# Patient Record
Sex: Male | Born: 1950
Health system: Southern US, Community
[De-identification: ages and names within clinical notes are randomized; demographics above are authoritative.]

## PROBLEM LIST (undated history)

## (undated) DIAGNOSIS — E079 Disorder of thyroid, unspecified: Secondary | ICD-10-CM

## (undated) DIAGNOSIS — E039 Hypothyroidism, unspecified: Secondary | ICD-10-CM

## (undated) DIAGNOSIS — I10 Essential (primary) hypertension: Secondary | ICD-10-CM

## (undated) DIAGNOSIS — D649 Anemia, unspecified: Secondary | ICD-10-CM

## (undated) DIAGNOSIS — E119 Type 2 diabetes mellitus without complications: Secondary | ICD-10-CM

## (undated) DIAGNOSIS — I2510 Atherosclerotic heart disease of native coronary artery without angina pectoris: Secondary | ICD-10-CM

## (undated) HISTORY — PX: COLONOSCOPY: SHX174

## (undated) HISTORY — DX: Atherosclerotic heart disease of native coronary artery without angina pectoris: I25.10

## (undated) HISTORY — DX: Type 2 diabetes mellitus without complications: E11.9

## (undated) HISTORY — DX: Disorder of thyroid, unspecified: E07.9

---

## 2013-10-10 DIAGNOSIS — Z23 Encounter for immunization: Secondary | ICD-10-CM | POA: Diagnosis not present

## 2013-10-10 DIAGNOSIS — Z6828 Body mass index (BMI) 28.0-28.9, adult: Secondary | ICD-10-CM | POA: Diagnosis not present

## 2013-10-10 DIAGNOSIS — D649 Anemia, unspecified: Secondary | ICD-10-CM | POA: Diagnosis not present

## 2013-10-10 DIAGNOSIS — E119 Type 2 diabetes mellitus without complications: Secondary | ICD-10-CM | POA: Diagnosis not present

## 2013-10-10 DIAGNOSIS — F17200 Nicotine dependence, unspecified, uncomplicated: Secondary | ICD-10-CM | POA: Diagnosis not present

## 2013-10-10 DIAGNOSIS — Z0000 Encounter for general adult medical examination without abnormal findings: Secondary | ICD-10-CM | POA: Diagnosis not present

## 2013-10-11 DIAGNOSIS — Z0000 Encounter for general adult medical examination without abnormal findings: Secondary | ICD-10-CM | POA: Diagnosis not present

## 2013-10-11 DIAGNOSIS — E568 Deficiency of other vitamins: Secondary | ICD-10-CM | POA: Diagnosis not present

## 2013-10-11 DIAGNOSIS — D649 Anemia, unspecified: Secondary | ICD-10-CM | POA: Diagnosis not present

## 2013-10-11 DIAGNOSIS — E119 Type 2 diabetes mellitus without complications: Secondary | ICD-10-CM | POA: Diagnosis not present

## 2013-10-11 DIAGNOSIS — E785 Hyperlipidemia, unspecified: Secondary | ICD-10-CM | POA: Diagnosis not present

## 2013-10-14 DIAGNOSIS — E119 Type 2 diabetes mellitus without complications: Secondary | ICD-10-CM | POA: Diagnosis not present

## 2013-10-14 DIAGNOSIS — H26499 Other secondary cataract, unspecified eye: Secondary | ICD-10-CM | POA: Diagnosis not present

## 2013-10-14 DIAGNOSIS — I10 Essential (primary) hypertension: Secondary | ICD-10-CM | POA: Diagnosis not present

## 2013-10-14 DIAGNOSIS — H43819 Vitreous degeneration, unspecified eye: Secondary | ICD-10-CM | POA: Diagnosis not present

## 2013-10-14 DIAGNOSIS — Z961 Presence of intraocular lens: Secondary | ICD-10-CM | POA: Diagnosis not present

## 2013-11-04 DIAGNOSIS — M47817 Spondylosis without myelopathy or radiculopathy, lumbosacral region: Secondary | ICD-10-CM | POA: Diagnosis not present

## 2013-11-04 DIAGNOSIS — M5137 Other intervertebral disc degeneration, lumbosacral region: Secondary | ICD-10-CM | POA: Diagnosis not present

## 2013-11-04 DIAGNOSIS — M5030 Other cervical disc degeneration, unspecified cervical region: Secondary | ICD-10-CM | POA: Diagnosis not present

## 2013-11-04 DIAGNOSIS — M5126 Other intervertebral disc displacement, lumbar region: Secondary | ICD-10-CM | POA: Diagnosis not present

## 2013-11-05 DIAGNOSIS — Z0189 Encounter for other specified special examinations: Secondary | ICD-10-CM | POA: Diagnosis not present

## 2013-11-05 DIAGNOSIS — M47817 Spondylosis without myelopathy or radiculopathy, lumbosacral region: Secondary | ICD-10-CM | POA: Diagnosis not present

## 2013-11-05 DIAGNOSIS — M545 Low back pain, unspecified: Secondary | ICD-10-CM | POA: Diagnosis not present

## 2013-11-05 DIAGNOSIS — M5126 Other intervertebral disc displacement, lumbar region: Secondary | ICD-10-CM | POA: Diagnosis not present

## 2013-11-05 DIAGNOSIS — M5106 Intervertebral disc disorders with myelopathy, lumbar region: Secondary | ICD-10-CM | POA: Diagnosis not present

## 2013-11-18 DIAGNOSIS — R079 Chest pain, unspecified: Secondary | ICD-10-CM | POA: Diagnosis not present

## 2013-11-18 DIAGNOSIS — E785 Hyperlipidemia, unspecified: Secondary | ICD-10-CM | POA: Diagnosis not present

## 2013-11-18 DIAGNOSIS — I10 Essential (primary) hypertension: Secondary | ICD-10-CM | POA: Diagnosis not present

## 2013-11-18 DIAGNOSIS — I119 Hypertensive heart disease without heart failure: Secondary | ICD-10-CM | POA: Diagnosis not present

## 2013-11-20 DIAGNOSIS — M1710 Unilateral primary osteoarthritis, unspecified knee: Secondary | ICD-10-CM | POA: Diagnosis not present

## 2013-11-20 DIAGNOSIS — IMO0002 Reserved for concepts with insufficient information to code with codable children: Secondary | ICD-10-CM | POA: Diagnosis not present

## 2013-11-20 DIAGNOSIS — M2550 Pain in unspecified joint: Secondary | ICD-10-CM | POA: Diagnosis not present

## 2013-11-27 DIAGNOSIS — M47817 Spondylosis without myelopathy or radiculopathy, lumbosacral region: Secondary | ICD-10-CM | POA: Diagnosis not present

## 2013-11-27 DIAGNOSIS — M5126 Other intervertebral disc displacement, lumbar region: Secondary | ICD-10-CM | POA: Diagnosis not present

## 2013-12-03 DIAGNOSIS — I2510 Atherosclerotic heart disease of native coronary artery without angina pectoris: Secondary | ICD-10-CM | POA: Diagnosis not present

## 2013-12-03 DIAGNOSIS — D490 Neoplasm of unspecified behavior of digestive system: Secondary | ICD-10-CM | POA: Diagnosis not present

## 2013-12-03 DIAGNOSIS — E119 Type 2 diabetes mellitus without complications: Secondary | ICD-10-CM | POA: Diagnosis not present

## 2013-12-03 DIAGNOSIS — R0789 Other chest pain: Secondary | ICD-10-CM | POA: Diagnosis not present

## 2013-12-03 DIAGNOSIS — Z87891 Personal history of nicotine dependence: Secondary | ICD-10-CM | POA: Diagnosis not present

## 2013-12-03 DIAGNOSIS — Z7982 Long term (current) use of aspirin: Secondary | ICD-10-CM | POA: Diagnosis not present

## 2013-12-03 DIAGNOSIS — D649 Anemia, unspecified: Secondary | ICD-10-CM | POA: Diagnosis not present

## 2013-12-03 DIAGNOSIS — E785 Hyperlipidemia, unspecified: Secondary | ICD-10-CM | POA: Diagnosis not present

## 2013-12-03 DIAGNOSIS — I10 Essential (primary) hypertension: Secondary | ICD-10-CM | POA: Diagnosis not present

## 2013-12-25 DIAGNOSIS — M5126 Other intervertebral disc displacement, lumbar region: Secondary | ICD-10-CM | POA: Diagnosis not present

## 2013-12-25 DIAGNOSIS — M47817 Spondylosis without myelopathy or radiculopathy, lumbosacral region: Secondary | ICD-10-CM | POA: Diagnosis not present

## 2014-01-14 DIAGNOSIS — M47817 Spondylosis without myelopathy or radiculopathy, lumbosacral region: Secondary | ICD-10-CM | POA: Diagnosis not present

## 2014-01-14 DIAGNOSIS — M545 Low back pain, unspecified: Secondary | ICD-10-CM | POA: Diagnosis not present

## 2014-01-14 DIAGNOSIS — Z79899 Other long term (current) drug therapy: Secondary | ICD-10-CM | POA: Diagnosis not present

## 2014-01-14 DIAGNOSIS — M2550 Pain in unspecified joint: Secondary | ICD-10-CM | POA: Diagnosis not present

## 2014-01-22 DIAGNOSIS — E538 Deficiency of other specified B group vitamins: Secondary | ICD-10-CM | POA: Diagnosis not present

## 2014-01-22 DIAGNOSIS — J029 Acute pharyngitis, unspecified: Secondary | ICD-10-CM | POA: Diagnosis not present

## 2014-01-22 DIAGNOSIS — E119 Type 2 diabetes mellitus without complications: Secondary | ICD-10-CM | POA: Diagnosis not present

## 2014-01-22 DIAGNOSIS — R290 Tetany: Secondary | ICD-10-CM | POA: Diagnosis not present

## 2014-01-29 DIAGNOSIS — M545 Low back pain, unspecified: Secondary | ICD-10-CM | POA: Diagnosis not present

## 2014-01-29 DIAGNOSIS — M47817 Spondylosis without myelopathy or radiculopathy, lumbosacral region: Secondary | ICD-10-CM | POA: Diagnosis not present

## 2014-01-29 DIAGNOSIS — Z79899 Other long term (current) drug therapy: Secondary | ICD-10-CM | POA: Diagnosis not present

## 2014-01-29 DIAGNOSIS — M2550 Pain in unspecified joint: Secondary | ICD-10-CM | POA: Diagnosis not present

## 2014-02-12 DIAGNOSIS — M545 Low back pain, unspecified: Secondary | ICD-10-CM | POA: Diagnosis not present

## 2014-02-12 DIAGNOSIS — M47817 Spondylosis without myelopathy or radiculopathy, lumbosacral region: Secondary | ICD-10-CM | POA: Diagnosis not present

## 2014-02-26 DIAGNOSIS — M2550 Pain in unspecified joint: Secondary | ICD-10-CM | POA: Diagnosis not present

## 2014-02-26 DIAGNOSIS — M545 Low back pain, unspecified: Secondary | ICD-10-CM | POA: Diagnosis not present

## 2014-02-26 DIAGNOSIS — M1710 Unilateral primary osteoarthritis, unspecified knee: Secondary | ICD-10-CM | POA: Diagnosis not present

## 2014-02-26 DIAGNOSIS — IMO0002 Reserved for concepts with insufficient information to code with codable children: Secondary | ICD-10-CM | POA: Diagnosis not present

## 2014-02-26 DIAGNOSIS — M159 Polyosteoarthritis, unspecified: Secondary | ICD-10-CM | POA: Diagnosis not present

## 2014-03-26 DIAGNOSIS — M47817 Spondylosis without myelopathy or radiculopathy, lumbosacral region: Secondary | ICD-10-CM | POA: Diagnosis not present

## 2014-04-06 DIAGNOSIS — Z79899 Other long term (current) drug therapy: Secondary | ICD-10-CM | POA: Diagnosis not present

## 2014-04-06 DIAGNOSIS — M545 Low back pain: Secondary | ICD-10-CM | POA: Diagnosis not present

## 2014-04-06 DIAGNOSIS — M47816 Spondylosis without myelopathy or radiculopathy, lumbar region: Secondary | ICD-10-CM | POA: Diagnosis not present

## 2014-04-06 DIAGNOSIS — M47817 Spondylosis without myelopathy or radiculopathy, lumbosacral region: Secondary | ICD-10-CM | POA: Diagnosis not present

## 2014-04-06 DIAGNOSIS — M5441 Lumbago with sciatica, right side: Secondary | ICD-10-CM | POA: Diagnosis not present

## 2014-04-07 DIAGNOSIS — Z23 Encounter for immunization: Secondary | ICD-10-CM | POA: Diagnosis not present

## 2014-04-13 DIAGNOSIS — E785 Hyperlipidemia, unspecified: Secondary | ICD-10-CM | POA: Diagnosis not present

## 2014-04-13 DIAGNOSIS — D649 Anemia, unspecified: Secondary | ICD-10-CM | POA: Diagnosis not present

## 2014-04-13 DIAGNOSIS — E119 Type 2 diabetes mellitus without complications: Secondary | ICD-10-CM | POA: Diagnosis not present

## 2014-04-13 DIAGNOSIS — E538 Deficiency of other specified B group vitamins: Secondary | ICD-10-CM | POA: Diagnosis not present

## 2014-04-16 DIAGNOSIS — I10 Essential (primary) hypertension: Secondary | ICD-10-CM | POA: Diagnosis not present

## 2014-04-16 DIAGNOSIS — E559 Vitamin D deficiency, unspecified: Secondary | ICD-10-CM | POA: Diagnosis not present

## 2014-04-16 DIAGNOSIS — E785 Hyperlipidemia, unspecified: Secondary | ICD-10-CM | POA: Diagnosis not present

## 2014-04-16 DIAGNOSIS — R3915 Urgency of urination: Secondary | ICD-10-CM | POA: Diagnosis not present

## 2014-04-16 DIAGNOSIS — D510 Vitamin B12 deficiency anemia due to intrinsic factor deficiency: Secondary | ICD-10-CM | POA: Diagnosis not present

## 2014-04-16 DIAGNOSIS — E119 Type 2 diabetes mellitus without complications: Secondary | ICD-10-CM | POA: Diagnosis not present

## 2014-04-20 DIAGNOSIS — M5442 Lumbago with sciatica, left side: Secondary | ICD-10-CM | POA: Diagnosis not present

## 2014-04-20 DIAGNOSIS — M47816 Spondylosis without myelopathy or radiculopathy, lumbar region: Secondary | ICD-10-CM | POA: Diagnosis not present

## 2014-04-20 DIAGNOSIS — M545 Low back pain: Secondary | ICD-10-CM | POA: Diagnosis not present

## 2014-04-20 DIAGNOSIS — M47817 Spondylosis without myelopathy or radiculopathy, lumbosacral region: Secondary | ICD-10-CM | POA: Diagnosis not present

## 2014-04-20 DIAGNOSIS — M5432 Sciatica, left side: Secondary | ICD-10-CM | POA: Diagnosis not present

## 2014-04-20 DIAGNOSIS — Z79899 Other long term (current) drug therapy: Secondary | ICD-10-CM | POA: Diagnosis not present

## 2014-05-05 DIAGNOSIS — M47817 Spondylosis without myelopathy or radiculopathy, lumbosacral region: Secondary | ICD-10-CM | POA: Diagnosis not present

## 2014-06-30 DIAGNOSIS — E119 Type 2 diabetes mellitus without complications: Secondary | ICD-10-CM | POA: Diagnosis not present

## 2014-06-30 DIAGNOSIS — D519 Vitamin B12 deficiency anemia, unspecified: Secondary | ICD-10-CM | POA: Diagnosis not present

## 2014-08-25 DIAGNOSIS — D519 Vitamin B12 deficiency anemia, unspecified: Secondary | ICD-10-CM | POA: Diagnosis not present

## 2014-08-25 DIAGNOSIS — Z0001 Encounter for general adult medical examination with abnormal findings: Secondary | ICD-10-CM | POA: Diagnosis not present

## 2014-08-31 DIAGNOSIS — M25861 Other specified joint disorders, right knee: Secondary | ICD-10-CM | POA: Diagnosis not present

## 2014-08-31 DIAGNOSIS — D519 Vitamin B12 deficiency anemia, unspecified: Secondary | ICD-10-CM | POA: Diagnosis not present

## 2014-08-31 DIAGNOSIS — E119 Type 2 diabetes mellitus without complications: Secondary | ICD-10-CM | POA: Diagnosis not present

## 2014-08-31 DIAGNOSIS — G5761 Lesion of plantar nerve, right lower limb: Secondary | ICD-10-CM | POA: Insufficient documentation

## 2014-08-31 DIAGNOSIS — M1711 Unilateral primary osteoarthritis, right knee: Secondary | ICD-10-CM | POA: Insufficient documentation

## 2014-09-07 DIAGNOSIS — D519 Vitamin B12 deficiency anemia, unspecified: Secondary | ICD-10-CM | POA: Diagnosis not present

## 2014-09-15 DIAGNOSIS — D519 Vitamin B12 deficiency anemia, unspecified: Secondary | ICD-10-CM | POA: Diagnosis not present

## 2014-09-29 DIAGNOSIS — D519 Vitamin B12 deficiency anemia, unspecified: Secondary | ICD-10-CM | POA: Diagnosis not present

## 2014-10-27 DIAGNOSIS — D519 Vitamin B12 deficiency anemia, unspecified: Secondary | ICD-10-CM | POA: Diagnosis not present

## 2014-12-04 DIAGNOSIS — E1165 Type 2 diabetes mellitus with hyperglycemia: Secondary | ICD-10-CM | POA: Diagnosis not present

## 2014-12-04 DIAGNOSIS — D509 Iron deficiency anemia, unspecified: Secondary | ICD-10-CM | POA: Diagnosis not present

## 2014-12-04 DIAGNOSIS — D519 Vitamin B12 deficiency anemia, unspecified: Secondary | ICD-10-CM | POA: Diagnosis not present

## 2015-01-11 DIAGNOSIS — M1711 Unilateral primary osteoarthritis, right knee: Secondary | ICD-10-CM | POA: Diagnosis not present

## 2015-01-11 DIAGNOSIS — M47816 Spondylosis without myelopathy or radiculopathy, lumbar region: Secondary | ICD-10-CM | POA: Insufficient documentation

## 2015-01-11 DIAGNOSIS — M25861 Other specified joint disorders, right knee: Secondary | ICD-10-CM | POA: Diagnosis not present

## 2015-01-14 DIAGNOSIS — D519 Vitamin B12 deficiency anemia, unspecified: Secondary | ICD-10-CM | POA: Diagnosis not present

## 2015-02-05 DIAGNOSIS — D519 Vitamin B12 deficiency anemia, unspecified: Secondary | ICD-10-CM | POA: Diagnosis not present

## 2015-02-09 DIAGNOSIS — D649 Anemia, unspecified: Secondary | ICD-10-CM | POA: Diagnosis not present

## 2015-02-09 DIAGNOSIS — T8069XA Other serum reaction due to other serum, initial encounter: Secondary | ICD-10-CM | POA: Diagnosis not present

## 2015-02-09 DIAGNOSIS — E538 Deficiency of other specified B group vitamins: Secondary | ICD-10-CM | POA: Diagnosis not present

## 2015-02-18 DIAGNOSIS — D519 Vitamin B12 deficiency anemia, unspecified: Secondary | ICD-10-CM | POA: Diagnosis not present

## 2015-02-18 DIAGNOSIS — E1165 Type 2 diabetes mellitus with hyperglycemia: Secondary | ICD-10-CM | POA: Diagnosis not present

## 2015-02-18 DIAGNOSIS — Z85028 Personal history of other malignant neoplasm of stomach: Secondary | ICD-10-CM | POA: Diagnosis not present

## 2015-02-18 DIAGNOSIS — D509 Iron deficiency anemia, unspecified: Secondary | ICD-10-CM | POA: Diagnosis not present

## 2015-02-24 DIAGNOSIS — D519 Vitamin B12 deficiency anemia, unspecified: Secondary | ICD-10-CM | POA: Diagnosis not present

## 2015-07-26 DIAGNOSIS — Z85028 Personal history of other malignant neoplasm of stomach: Secondary | ICD-10-CM | POA: Diagnosis not present

## 2015-07-26 DIAGNOSIS — D519 Vitamin B12 deficiency anemia, unspecified: Secondary | ICD-10-CM | POA: Diagnosis not present

## 2015-07-26 DIAGNOSIS — E1165 Type 2 diabetes mellitus with hyperglycemia: Secondary | ICD-10-CM | POA: Diagnosis not present

## 2015-07-26 DIAGNOSIS — I10 Essential (primary) hypertension: Secondary | ICD-10-CM | POA: Diagnosis not present

## 2015-07-26 DIAGNOSIS — Z0001 Encounter for general adult medical examination with abnormal findings: Secondary | ICD-10-CM | POA: Diagnosis not present

## 2015-07-27 ENCOUNTER — Telehealth: Payer: Self-pay | Admitting: Gastroenterology

## 2015-07-27 NOTE — Telephone Encounter (Signed)
colonoscopy

## 2015-08-02 ENCOUNTER — Telehealth: Payer: Self-pay

## 2015-08-02 ENCOUNTER — Other Ambulatory Visit: Payer: Self-pay

## 2015-08-02 NOTE — Telephone Encounter (Signed)
Gastroenterology Pre-Procedure Review  Request Date: 08/30/15 Requesting Physician: Dr. Clayborn Bigness  PATIENT REVIEW QUESTIONS: The patient responded to the following health history questions as indicated:    1. Are you having any GI issues? no 2. Do you have a personal history of Polyps? yes (adenoma) 3. Do you have a family history of Colon Cancer or Polyps? no 4. Diabetes Mellitus? no 5. Joint replacements in the past 12 months?no 6. Major health problems in the past 3 months?no 7. Any artificial heart valves, MVP, or defibrillator?no    MEDICATIONS & ALLERGIES:    Patient reports the following regarding taking any anticoagulation/antiplatelet therapy:   Plavix, Coumadin, Eliquis, Xarelto, Lovenox, Pradaxa, Brilinta, or Effient? no Aspirin? no  Patient confirms/reports the following medications:  Current Outpatient Prescriptions  Medication Sig Dispense Refill  . glipiZIDE (GLUCOTROL) 5 MG tablet Take by mouth daily before breakfast.     No current facility-administered medications for this visit.    Patient confirms/reports the following allergies:  No Known Allergies  No orders of the defined types were placed in this encounter.    AUTHORIZATION INFORMATION Primary Insurance: 1D#: Group #:  Secondary Insurance: 1D#: Group #:  SCHEDULE INFORMATION: Date: 08/30/15 Time: Location: [place]

## 2015-08-02 NOTE — Telephone Encounter (Signed)
Pt scheduled for diagnostic colonoscopy at [facility] on 08/30/15. Instructs/rx mailed.

## 2015-08-02 NOTE — Telephone Encounter (Signed)
Pt was at school. Will call me back.

## 2015-09-15 ENCOUNTER — Encounter: Payer: Self-pay | Admitting: *Deleted

## 2015-09-16 NOTE — Discharge Instructions (Signed)

## 2015-09-20 ENCOUNTER — Encounter
Admission: RE | Disposition: A | Discharge status: Home / Self Care | Payer: Self-pay | Source: Ambulatory Visit | Attending: Gastroenterology

## 2015-09-20 ENCOUNTER — Ambulatory Visit: Payer: Medicare Other | Admitting: Anesthesiology

## 2015-09-20 ENCOUNTER — Ambulatory Visit
Admission: RE | Admit: 2015-09-20 | Discharge: 2015-09-20 | Disposition: A | Discharge status: Home / Self Care | Payer: Medicare Other | Source: Ambulatory Visit | Attending: Gastroenterology | Admitting: Gastroenterology

## 2015-09-20 DIAGNOSIS — Z1211 Encounter for screening for malignant neoplasm of colon: Secondary | ICD-10-CM | POA: Insufficient documentation

## 2015-09-20 DIAGNOSIS — K621 Rectal polyp: Secondary | ICD-10-CM | POA: Diagnosis not present

## 2015-09-20 DIAGNOSIS — D125 Benign neoplasm of sigmoid colon: Secondary | ICD-10-CM | POA: Diagnosis not present

## 2015-09-20 DIAGNOSIS — Z87891 Personal history of nicotine dependence: Secondary | ICD-10-CM | POA: Diagnosis not present

## 2015-09-20 DIAGNOSIS — Z0001 Encounter for general adult medical examination with abnormal findings: Secondary | ICD-10-CM | POA: Insufficient documentation

## 2015-09-20 DIAGNOSIS — Z86010 Personal history of colonic polyps: Secondary | ICD-10-CM | POA: Diagnosis not present

## 2015-09-20 DIAGNOSIS — K640 First degree hemorrhoids: Secondary | ICD-10-CM | POA: Diagnosis not present

## 2015-09-20 DIAGNOSIS — K635 Polyp of colon: Secondary | ICD-10-CM | POA: Diagnosis not present

## 2015-09-20 HISTORY — DX: Anemia, unspecified: D64.9

## 2015-09-20 HISTORY — PX: COLONOSCOPY WITH PROPOFOL: SHX5780

## 2015-09-20 HISTORY — PX: POLYPECTOMY: SHX149

## 2015-09-20 SURGERY — COLONOSCOPY WITH PROPOFOL
Anesthesia: Monitor Anesthesia Care | Wound class: Contaminated

## 2015-09-20 MED ORDER — SODIUM CHLORIDE 0.9 % IV SOLN: INTRAVENOUS | Status: DC

## 2015-09-20 MED ORDER — LIDOCAINE HCL (CARDIAC) 20 MG/ML IV SOLN
INTRAVENOUS | Status: DC | PRN
Start: 2015-09-20 — End: 2015-09-20
  Administered 2015-09-20: 40 mg via INTRAVENOUS

## 2015-09-20 MED ORDER — PROPOFOL 10 MG/ML IV BOLUS
INTRAVENOUS | Status: DC | PRN
  Administered 2015-09-20 (×2): 20 mg via INTRAVENOUS
  Administered 2015-09-20: 60 mg via INTRAVENOUS
  Administered 2015-09-20: 20 mg via INTRAVENOUS
  Administered 2015-09-20: 30 mg via INTRAVENOUS

## 2015-09-20 MED ORDER — LACTATED RINGERS IV SOLN
INTRAVENOUS | Status: DC
  Administered 2015-09-20: 09:00:00 via INTRAVENOUS

## 2015-09-20 SURGICAL SUPPLY — 21 items
CANISTER SUCT 1200ML W/VALVE (MISCELLANEOUS) ×4 IMPLANT
CLIP HMST 235XBRD CATH ROT (MISCELLANEOUS) IMPLANT
CLIP RESOLUTION 360 11X235 (MISCELLANEOUS)
FCP ESCP3.2XJMB 240X2.8X (MISCELLANEOUS)
FORCEPS BIOP RAD 4 LRG CAP 4 (CUTTING FORCEPS) ×4 IMPLANT
FORCEPS BIOP RJ4 240 W/NDL (MISCELLANEOUS)
FORCEPS ESCP3.2XJMB 240X2.8X (MISCELLANEOUS) IMPLANT
GOWN CVR UNV OPN BCK APRN NK (MISCELLANEOUS) ×4 IMPLANT
GOWN ISOL THUMB LOOP REG UNIV (MISCELLANEOUS) ×4
INJECTOR VARIJECT VIN23 (MISCELLANEOUS) IMPLANT
KIT DEFENDO VALVE AND CONN (KITS) IMPLANT
KIT ENDO PROCEDURE OLY (KITS) ×4 IMPLANT
MARKER SPOT ENDO TATTOO 5ML (MISCELLANEOUS) IMPLANT
PAD GROUND ADULT SPLIT (MISCELLANEOUS) IMPLANT
PROBE APC STR FIRE (PROBE) ×4 IMPLANT
SNARE SHORT THROW 13M SML OVAL (MISCELLANEOUS) IMPLANT
SNARE SHORT THROW 30M LRG OVAL (MISCELLANEOUS) IMPLANT
SPOT EX ENDOSCOPIC TATTOO (MISCELLANEOUS)
VARIJECT INJECTOR VIN23 (MISCELLANEOUS)
WATER STERILE IRR 250ML POUR (IV SOLUTION) ×4 IMPLANT
WIDE-EYE POLYPTRAP (MISCELLANEOUS) IMPLANT

## 2015-09-20 NOTE — Op Note (Signed)
[facility] Gastroenterology Patient Name: Travis Higgins Procedure Date: 09/20/2015 9:28 AM MRN: [id number] Account #: [id number] Date of Birth: 22-Jun-1950 Admit Type: Outpatient Age: 65 Room: [facility] OR ROOM 01 Gender: Male Note Status: Finalized Procedure:            Colonoscopy Indications:          Screening for colorectal malignant neoplasm Providers:            Lucilla Lame, MD Referring MD:         Lavera Guise, MD (Referring MD) Medicines:            Propofol per Anesthesia Complications:        No immediate complications. Procedure:            Pre-Anesthesia Assessment:                       - Prior to the procedure, a History and Physical was                        performed, and patient medications and allergies were                        reviewed. The patient's tolerance of previous                        anesthesia was also reviewed. The risks and benefits of                        the procedure and the sedation options and risks were                        discussed with the patient. All questions were                        answered, and informed consent was obtained. Prior                        Anticoagulants: The patient has taken no previous                        anticoagulant or antiplatelet agents. ASA Grade                        Assessment: II - A patient with mild systemic disease.                        After reviewing the risks and benefits, the patient was                        deemed in satisfactory condition to undergo the                        procedure.                       After obtaining informed consent, the colonoscope was                        passed under direct vision. Throughout the procedure,  the patient's blood pressure, pulse, and oxygen                        saturations were monitored continuously. The Olympus CF                        H180AL colonoscope (S#: [id number]) was introduced through                        the anus and advanced to the the cecum, identified by                        appendiceal orifice and ileocecal valve. The                        colonoscopy was performed without difficulty. The                        patient tolerated the procedure well. The quality of                        the bowel preparation was good. Findings:      The perianal and digital rectal examinations were normal.      A 3 mm polyp was found in the sigmoid colon. The polyp was sessile. The       polyp was removed with a cold biopsy forceps. Resection and retrieval       were complete.      Two sessile polyps were found in the rectum. The polyps were 2 to 3 mm       in size. These polyps were removed with a cold biopsy forceps. Resection       and retrieval were complete.      Non-bleeding internal hemorrhoids were found during retroflexion. The       hemorrhoids were Grade I (internal hemorrhoids that do not prolapse). Impression:           - One 3 mm polyp in the sigmoid colon, removed with a                        cold biopsy forceps. Resected and retrieved.                       - Two 2 to 3 mm polyps in the rectum, removed with a                        cold biopsy forceps. Resected and retrieved.                       - Non-bleeding internal hemorrhoids. Recommendation:       - Await pathology results.                       - Repeat colonoscopy in 5 years for surveillance. Procedure Code(s):    --- Professional ---                       [phone], Colonoscopy, flexible; with biopsy, single or                        multiple Diagnosis Code(s):    ---  Professional ---                       Z12.11, Encounter for screening for malignant neoplasm                        of colon                       D12.5, Benign neoplasm of sigmoid colon                       K62.1, Rectal polyp CPT copyright 2016 American Medical Association. All rights reserved. The codes documented in this report are  preliminary and upon coder review may  be revised to meet current compliance requirements. Lucilla Lame, MD 09/20/2015 9:44:02 AM This report has been signed electronically. Number of Addenda: 0 Note Initiated On: 09/20/2015 9:28 AM Scope Withdrawal Time: 0 hours 7 minutes 23 seconds  Total Procedure Duration: 0 hours 10 minutes 27 seconds       [facility] [place]

## 2015-09-20 NOTE — Anesthesia Preprocedure Evaluation (Signed)
Anesthesia Evaluation  Patient identified by MRN, date of birth, ID band Patient awake    Reviewed: Allergy & Precautions, H&P , NPO status , Patient's Chart, lab work & pertinent test results  Airway Mallampati: II  TM Distance: >3 FB Neck ROM: full    Dental no notable dental hx.    Pulmonary former smoker,    Pulmonary exam normal       Cardiovascular negative cardio ROS Normal cardiovascular exam    Neuro/Psych    GI/Hepatic negative GI ROS, Neg liver ROS,   Endo/Other  negative endocrine ROS  Renal/GU negative Renal ROS     Musculoskeletal   Abdominal   Peds  Hematology negative hematology ROS (+)   Anesthesia Other Findings   Reproductive/Obstetrics                            Anesthesia Physical Anesthesia Plan  ASA: I  Anesthesia Plan: MAC   Post-op Pain Management:    Induction:   Airway Management Planned:   Additional Equipment:   Intra-op Plan:   Post-operative Plan:   Informed Consent: I have reviewed the patients History and Physical, chart, labs and discussed the procedure including the risks, benefits and alternatives for the proposed anesthesia with the patient or authorized representative who has indicated his/her understanding and acceptance.     Plan Discussed with: CRNA  Anesthesia Plan Comments:         Anesthesia Quick Evaluation  

## 2015-09-20 NOTE — Transfer of Care (Signed)
Immediate Anesthesia Transfer of Care Note  Patient: Travis Higgins  Procedure(s) Performed: Procedure(s) with comments: COLONOSCOPY WITH PROPOFOL (N/A) POLYPECTOMY INTESTINAL - Sigmoid colon polyp x 1 Rectal polyp x 2  Patient Location: PACU  Anesthesia Type: MAC  Level of Consciousness: awake, alert  and patient cooperative  Airway and Oxygen Therapy: Patient Spontanous Breathing and Patient connected to supplemental oxygen  Post-op Assessment: Post-op Vital signs reviewed, Patient's Cardiovascular Status Stable, Respiratory Function Stable, Patent Airway and No signs of Nausea or vomiting  Post-op Vital Signs: Reviewed and stable  Complications: No apparent anesthesia complications

## 2015-09-20 NOTE — Anesthesia Procedure Notes (Signed)
Procedure Name: MAC Performed by: Roy Snuffer Pre-anesthesia Checklist: Patient identified, Emergency Drugs available, Suction available, Timeout performed and Patient being monitored Patient Re-evaluated:Patient Re-evaluated prior to inductionOxygen Delivery Method: Nasal cannula Placement Confirmation: positive ETCO2       

## 2015-09-20 NOTE — Anesthesia Postprocedure Evaluation (Signed)
Anesthesia Post Note  Patient: Travis Higgins  Procedure(s) Performed: Procedure(s) (LRB): COLONOSCOPY WITH PROPOFOL (N/A) POLYPECTOMY INTESTINAL  Patient location during evaluation: PACU Anesthesia Type: MAC Level of consciousness: awake and alert Pain management: pain level controlled Vital Signs Assessment: post-procedure vital signs reviewed and stable Respiratory status: spontaneous breathing and respiratory function stable Cardiovascular status: stable Anesthetic complications: no    Quin Mcpherson, III,  Avalina Benko D

## 2015-09-20 NOTE — H&P (Signed)
  [name]  [address] [name], [name] [address] Phone: [phone] Fax : [phone]  Primary Care Physician:  Lavera Guise, MD Primary Gastroenterologist:  Dr. Allen Norris  Pre-Procedure History & Physical: HPI:  Travis Higgins is a 65 y.o. male is here for an colonoscopy.   Past Medical History  Diagnosis Date  . Anemia     in past    Past Surgical History  Procedure Laterality Date  . Colonoscopy      Prior to Admission medications   Not on File    Allergies as of 08/02/2015  . (No Known Allergies)    History reviewed. No pertinent family history.  Social History   Social History  . Marital Status: Married    Spouse Name: N/A  . Number of Children: N/A  . Years of Education: N/A   Occupational History  . Not on file.   Social History Main Topics  . Smoking status: Former Research scientist (life sciences)  . Smokeless tobacco: Not on file     Comment: quit approx 2013  . Alcohol Use: No  . Drug Use: Not on file  . Sexual Activity: Not on file   Other Topics Concern  . Not on file   Social History Narrative    Review of Systems: See HPI, otherwise negative ROS  Physical Exam: BP 145/97 mmHg  Pulse 57  Temp(Src) 97.2 F (36.2 C) (Temporal)  Resp 17  Ht 5\' 5"  (1.651 m)  Wt 161 lb 11.2 oz (73.347 kg)  BMI 26.91 kg/m2  SpO2 100% General:   Alert,  pleasant and cooperative in NAD Head:  Normocephalic and atraumatic. Neck:  Supple; no masses or thyromegaly. Lungs:  Clear throughout to auscultation.    Heart:  Regular rate and rhythm. Abdomen:  Soft, nontender and nondistended. Normal bowel sounds, without guarding, and without rebound.   Neurologic:  Alert and  oriented x4;  grossly normal neurologically.  Impression/Plan: [name] is here for an colonoscopy to be performed for history of colon polyps  Risks, benefits, limitations, and alternatives regarding  colonoscopy have been reviewed with the patient.  Questions have been  answered.  All parties agreeable.   Ollen Bowl, MD  09/20/2015, 8:24 AM

## 2015-09-21 ENCOUNTER — Encounter: Payer: Self-pay | Admitting: Gastroenterology

## 2015-09-22 ENCOUNTER — Encounter: Payer: Self-pay | Admitting: Gastroenterology

## 2015-12-17 DIAGNOSIS — E1165 Type 2 diabetes mellitus with hyperglycemia: Secondary | ICD-10-CM | POA: Diagnosis not present

## 2015-12-17 DIAGNOSIS — E0781 Sick-euthyroid syndrome: Secondary | ICD-10-CM | POA: Diagnosis not present

## 2015-12-17 DIAGNOSIS — Z125 Encounter for screening for malignant neoplasm of prostate: Secondary | ICD-10-CM | POA: Diagnosis not present

## 2015-12-17 DIAGNOSIS — D519 Vitamin B12 deficiency anemia, unspecified: Secondary | ICD-10-CM | POA: Diagnosis not present

## 2015-12-17 DIAGNOSIS — E039 Hypothyroidism, unspecified: Secondary | ICD-10-CM | POA: Diagnosis not present

## 2015-12-17 DIAGNOSIS — Z0001 Encounter for general adult medical examination with abnormal findings: Secondary | ICD-10-CM | POA: Diagnosis not present

## 2015-12-17 DIAGNOSIS — E079 Disorder of thyroid, unspecified: Secondary | ICD-10-CM | POA: Diagnosis not present

## 2016-01-04 DIAGNOSIS — M659 Synovitis and tenosynovitis, unspecified: Secondary | ICD-10-CM | POA: Diagnosis not present

## 2016-01-04 DIAGNOSIS — M79672 Pain in left foot: Secondary | ICD-10-CM | POA: Diagnosis not present

## 2016-02-04 DIAGNOSIS — E1165 Type 2 diabetes mellitus with hyperglycemia: Secondary | ICD-10-CM | POA: Diagnosis not present

## 2016-02-04 DIAGNOSIS — E039 Hypothyroidism, unspecified: Secondary | ICD-10-CM | POA: Diagnosis not present

## 2016-02-15 DIAGNOSIS — E039 Hypothyroidism, unspecified: Secondary | ICD-10-CM | POA: Diagnosis not present

## 2016-03-01 DIAGNOSIS — E039 Hypothyroidism, unspecified: Secondary | ICD-10-CM | POA: Diagnosis not present

## 2016-03-13 DIAGNOSIS — D519 Vitamin B12 deficiency anemia, unspecified: Secondary | ICD-10-CM | POA: Diagnosis not present

## 2016-03-13 DIAGNOSIS — D509 Iron deficiency anemia, unspecified: Secondary | ICD-10-CM | POA: Diagnosis not present

## 2016-03-13 DIAGNOSIS — Z23 Encounter for immunization: Secondary | ICD-10-CM | POA: Diagnosis not present

## 2016-03-13 DIAGNOSIS — E039 Hypothyroidism, unspecified: Secondary | ICD-10-CM | POA: Diagnosis not present

## 2016-05-29 DIAGNOSIS — E039 Hypothyroidism, unspecified: Secondary | ICD-10-CM | POA: Diagnosis not present

## 2016-05-29 DIAGNOSIS — E1165 Type 2 diabetes mellitus with hyperglycemia: Secondary | ICD-10-CM | POA: Diagnosis not present

## 2016-06-01 ENCOUNTER — Other Ambulatory Visit
Admission: RE | Admit: 2016-06-01 | Discharge: 2016-06-01 | Disposition: A | Discharge status: Home / Self Care | Payer: Medicare Other | Source: Ambulatory Visit | Attending: Nurse Practitioner | Admitting: Nurse Practitioner

## 2016-06-01 DIAGNOSIS — I10 Essential (primary) hypertension: Secondary | ICD-10-CM | POA: Diagnosis not present

## 2016-06-01 DIAGNOSIS — Z125 Encounter for screening for malignant neoplasm of prostate: Secondary | ICD-10-CM | POA: Insufficient documentation

## 2016-06-01 DIAGNOSIS — E119 Type 2 diabetes mellitus without complications: Secondary | ICD-10-CM | POA: Diagnosis not present

## 2016-06-01 LAB — COMPREHENSIVE METABOLIC PANEL
ALBUMIN: 4.2 g/dL (ref 3.5–5.0)
ALT: 43 U/L (ref 17–63)
AST: 43 U/L — ABNORMAL HIGH (ref 15–41)
Alkaline Phosphatase: 99 U/L (ref 38–126)
Anion gap: 6 (ref 5–15)
BILIRUBIN TOTAL: 1.1 mg/dL (ref 0.3–1.2)
BUN: 9 mg/dL (ref 6–20)
CO2: 27 mmol/L (ref 22–32)
Calcium: 9.3 mg/dL (ref 8.9–10.3)
Chloride: 101 mmol/L (ref 101–111)
Creatinine, Ser: 0.82 mg/dL (ref 0.61–1.24)
GFR calc Af Amer: >60 mL/min (ref >60)
Glucose, Bld: 137 mg/dL — ABNORMAL HIGH (ref 65–99)
POTASSIUM: 4.4 mmol/L (ref 3.5–5.1)
Sodium: 134 mmol/L — ABNORMAL LOW (ref 135–145)
TOTAL PROTEIN: 7.8 g/dL (ref 6.5–8.1)

## 2016-06-01 LAB — LIPID PANEL
CHOL/HDL RATIO: 4.9 ratio
CHOLESTEROL: 172 mg/dL (ref 0–200)
HDL: 35 mg/dL — AB (ref >40)
LDL Cholesterol: 111 mg/dL — ABNORMAL HIGH (ref 0–99)
Triglycerides: 130 mg/dL (ref <150)
VLDL: 26 mg/dL (ref 0–40)

## 2016-06-01 LAB — CBC
HEMATOCRIT: 43.8 % (ref 40.0–52.0)
Hemoglobin: 14.5 g/dL (ref 13.0–18.0)
MCH: 26.8 pg (ref 26.0–34.0)
MCHC: 33.1 g/dL (ref 32.0–36.0)
MCV: 81 fL (ref 80.0–100.0)
Platelets: 207 10*3/uL (ref 150–440)
RBC: 5.41 MIL/uL (ref 4.40–5.90)
RDW: 14.7 % — AB (ref 11.5–14.5)
WBC: 5.9 10*3/uL (ref 3.8–10.6)

## 2016-06-01 LAB — T4, FREE: Free T4: 0.84 ng/dL (ref 0.61–1.12)

## 2016-06-01 LAB — PSA: PSA: 0.43 ng/mL (ref 0.00–4.00)

## 2016-06-01 LAB — TSH: TSH: 9.328 u[IU]/mL — ABNORMAL HIGH (ref 0.350–4.500)

## 2016-06-02 LAB — MICROALBUMIN, URINE: MICROALB UR: 12.8 ug/mL — AB

## 2016-06-13 DIAGNOSIS — E039 Hypothyroidism, unspecified: Secondary | ICD-10-CM | POA: Diagnosis not present

## 2016-06-13 DIAGNOSIS — E1165 Type 2 diabetes mellitus with hyperglycemia: Secondary | ICD-10-CM | POA: Diagnosis not present

## 2016-06-13 DIAGNOSIS — J069 Acute upper respiratory infection, unspecified: Secondary | ICD-10-CM | POA: Diagnosis not present

## 2016-09-11 DIAGNOSIS — E039 Hypothyroidism, unspecified: Secondary | ICD-10-CM | POA: Diagnosis not present

## 2016-09-11 DIAGNOSIS — D519 Vitamin B12 deficiency anemia, unspecified: Secondary | ICD-10-CM | POA: Diagnosis not present

## 2016-09-11 DIAGNOSIS — E1165 Type 2 diabetes mellitus with hyperglycemia: Secondary | ICD-10-CM | POA: Diagnosis not present

## 2016-09-11 DIAGNOSIS — R030 Elevated blood-pressure reading, without diagnosis of hypertension: Secondary | ICD-10-CM | POA: Diagnosis not present

## 2016-09-15 ENCOUNTER — Other Ambulatory Visit
Admission: RE | Admit: 2016-09-15 | Discharge: 2016-09-15 | Disposition: A | Discharge status: Home / Self Care | Payer: Medicare Other | Source: Ambulatory Visit | Attending: Nurse Practitioner | Admitting: Nurse Practitioner

## 2016-09-15 DIAGNOSIS — D509 Iron deficiency anemia, unspecified: Secondary | ICD-10-CM | POA: Insufficient documentation

## 2016-09-15 DIAGNOSIS — E538 Deficiency of other specified B group vitamins: Secondary | ICD-10-CM | POA: Insufficient documentation

## 2016-09-15 DIAGNOSIS — E039 Hypothyroidism, unspecified: Secondary | ICD-10-CM | POA: Diagnosis not present

## 2016-09-15 LAB — CBC
HEMATOCRIT: 41.4 % (ref 40.0–52.0)
HEMOGLOBIN: 13.5 g/dL (ref 13.0–18.0)
MCH: 26.5 pg (ref 26.0–34.0)
MCHC: 32.5 g/dL (ref 32.0–36.0)
MCV: 81.6 fL (ref 80.0–100.0)
Platelets: 192 10*3/uL (ref 150–440)
RBC: 5.07 MIL/uL (ref 4.40–5.90)
RDW: 14.9 % — ABNORMAL HIGH (ref 11.5–14.5)
WBC: 4.8 10*3/uL (ref 3.8–10.6)

## 2016-09-15 LAB — VITAMIN B12: Vitamin B-12: 176 pg/mL — ABNORMAL LOW (ref 180–914)

## 2016-09-15 LAB — TSH: TSH: 4.755 u[IU]/mL — ABNORMAL HIGH (ref 0.350–4.500)

## 2016-09-15 LAB — FOLATE: Folate: 27 ng/mL (ref >5.9)

## 2016-09-15 LAB — FERRITIN: Ferritin: 8 ng/mL — ABNORMAL LOW (ref 24–336)

## 2016-09-15 LAB — T4, FREE: FREE T4: 0.94 ng/dL (ref 0.61–1.12)

## 2016-10-25 DIAGNOSIS — D519 Vitamin B12 deficiency anemia, unspecified: Secondary | ICD-10-CM | POA: Diagnosis not present

## 2016-11-01 DIAGNOSIS — D519 Vitamin B12 deficiency anemia, unspecified: Secondary | ICD-10-CM | POA: Diagnosis not present

## 2016-11-09 DIAGNOSIS — D519 Vitamin B12 deficiency anemia, unspecified: Secondary | ICD-10-CM | POA: Diagnosis not present

## 2016-11-21 DIAGNOSIS — D519 Vitamin B12 deficiency anemia, unspecified: Secondary | ICD-10-CM | POA: Diagnosis not present

## 2016-12-22 DIAGNOSIS — D519 Vitamin B12 deficiency anemia, unspecified: Secondary | ICD-10-CM | POA: Diagnosis not present

## 2016-12-22 DIAGNOSIS — R52 Pain, unspecified: Secondary | ICD-10-CM | POA: Diagnosis not present

## 2016-12-22 DIAGNOSIS — E039 Hypothyroidism, unspecified: Secondary | ICD-10-CM | POA: Diagnosis not present

## 2017-01-08 ENCOUNTER — Other Ambulatory Visit: Payer: Self-pay

## 2017-01-19 DIAGNOSIS — Z0001 Encounter for general adult medical examination with abnormal findings: Secondary | ICD-10-CM | POA: Diagnosis not present

## 2017-01-19 DIAGNOSIS — B370 Candidal stomatitis: Secondary | ICD-10-CM | POA: Diagnosis not present

## 2017-01-19 DIAGNOSIS — D519 Vitamin B12 deficiency anemia, unspecified: Secondary | ICD-10-CM | POA: Diagnosis not present

## 2017-01-19 DIAGNOSIS — E039 Hypothyroidism, unspecified: Secondary | ICD-10-CM | POA: Diagnosis not present

## 2017-01-19 DIAGNOSIS — R079 Chest pain, unspecified: Secondary | ICD-10-CM | POA: Diagnosis not present

## 2017-01-19 DIAGNOSIS — I10 Essential (primary) hypertension: Secondary | ICD-10-CM | POA: Diagnosis not present

## 2017-01-19 DIAGNOSIS — E1140 Type 2 diabetes mellitus with diabetic neuropathy, unspecified: Secondary | ICD-10-CM | POA: Diagnosis not present

## 2017-01-22 DIAGNOSIS — Z23 Encounter for immunization: Secondary | ICD-10-CM | POA: Diagnosis not present

## 2017-02-13 DIAGNOSIS — B370 Candidal stomatitis: Secondary | ICD-10-CM | POA: Diagnosis not present

## 2017-02-13 DIAGNOSIS — I10 Essential (primary) hypertension: Secondary | ICD-10-CM | POA: Diagnosis not present

## 2017-02-13 DIAGNOSIS — R079 Chest pain, unspecified: Secondary | ICD-10-CM | POA: Diagnosis not present

## 2017-02-13 DIAGNOSIS — E1165 Type 2 diabetes mellitus with hyperglycemia: Secondary | ICD-10-CM | POA: Diagnosis not present

## 2017-02-23 DIAGNOSIS — D519 Vitamin B12 deficiency anemia, unspecified: Secondary | ICD-10-CM | POA: Diagnosis not present

## 2017-02-26 ENCOUNTER — Ambulatory Visit: Payer: Medicare Other | Admitting: Cardiovascular Disease

## 2017-03-05 DIAGNOSIS — D519 Vitamin B12 deficiency anemia, unspecified: Secondary | ICD-10-CM | POA: Diagnosis not present

## 2017-03-05 DIAGNOSIS — R079 Chest pain, unspecified: Secondary | ICD-10-CM | POA: Diagnosis not present

## 2017-04-05 ENCOUNTER — Ambulatory Visit: Payer: Medicare Other | Admitting: Cardiovascular Disease

## 2017-04-27 DIAGNOSIS — Z23 Encounter for immunization: Secondary | ICD-10-CM | POA: Diagnosis not present

## 2017-05-09 DIAGNOSIS — D519 Vitamin B12 deficiency anemia, unspecified: Secondary | ICD-10-CM | POA: Diagnosis not present

## 2017-05-24 ENCOUNTER — Ambulatory Visit (INDEPENDENT_AMBULATORY_CARE_PROVIDER_SITE_OTHER): Payer: Medicare Other | Admitting: Cardiovascular Disease

## 2017-05-24 ENCOUNTER — Encounter: Payer: Self-pay | Admitting: Cardiovascular Disease

## 2017-05-24 DIAGNOSIS — R0602 Shortness of breath: Secondary | ICD-10-CM

## 2017-05-24 MED ORDER — ASPIRIN EC 81 MG PO TBEC: 81.0000 mg | DELAYED_RELEASE_TABLET | Freq: Every day | ORAL | 3 refills | Status: DC

## 2017-05-24 NOTE — Progress Notes (Signed)
Cardiology Office Note   Date:  05/24/2017   ID:  Travis Higgins, [name] 03/20/1951, MRN [id number]  PCP:  Lavera Guise, MD  Cardiologist:   Travis Sacramento, MD   Chief Complaint  Patient presents with  . OTHER    Chest pain with exertion and leg weakness. Meds reviewed verbally with pt.      History of Present Illness: Travis Higgins is a 66 y.o. male who was referred by Travis Higgins for evaluation of shortness of breath and fatigue.  The patient has no previous cardiac history.  His wife reports that he possibly had cardiac catheterization in 2014 or 2015 in [place] which was unremarkable.  We do not have these records. He has prolonged history of type 2 diabetes and hypothyroidism.  He quit smoking about 5 years ago.  There is no family history of premature coronary artery disease.  He is a retired Radio producer. He reports intermittent episodes of exertional fatigue with excessive sweating with exertion.  He denies any chest pain.  The symptoms have been progressive.  No orthopnea, PND or leg edema.    Past Medical History:  Diagnosis Date  . Anemia    in past  . Diabetes mellitus without complication ([place])   . Thyroid disease     Past Surgical History:  Procedure Laterality Date  . COLONOSCOPY    . COLONOSCOPY WITH PROPOFOL N/A 09/20/2015   Procedure: COLONOSCOPY WITH PROPOFOL;  Surgeon: Travis Lame, MD;  Location: [facility];  Service: Endoscopy;  Laterality: N/A;  . POLYPECTOMY  09/20/2015   Procedure: POLYPECTOMY INTESTINAL;  Surgeon: Travis Lame, MD;  Location: [facility];  Service: Endoscopy;;  Sigmoid colon polyp x 1 Rectal polyp x 2     Current Outpatient Medications  Medication Sig Dispense Refill  . glipiZIDE (GLUCOTROL XL) 5 MG 24 hr tablet Take 5 mg by mouth 2 (two) times daily.     Marland Kitchen levothyroxine (SYNTHROID, LEVOTHROID) 50 MCG tablet Take 50 mcg by mouth daily before breakfast.     No current facility-administered medications for  this visit.     Allergies:   Patient has no known allergies.    Social History:  The patient  reports that he has quit smoking. he has never used smokeless tobacco. He reports that he does not drink alcohol or use drugs.   Family History:  The patient's family history is negative for coronary artery disease.   ROS:  Please see the history of present illness.   Otherwise, review of systems are positive for none.   All other systems are reviewed and negative.    PHYSICAL EXAM: VS:  BP (!) 156/83 (BP Location: Right Arm, Patient Position: Sitting, Cuff Size: Normal)   Pulse 73   Ht 5\' 5"  (1.651 m)   Wt 172 lb 12 oz (78.4 kg)   BMI 28.75 kg/m  , BMI Body mass index is 28.75 kg/m. GEN: Well nourished, well developed, in no acute distress  HEENT: normal  Neck: no JVD, carotid bruits, or masses Cardiac: RRR; no murmurs, rubs, or gallops,no edema  Respiratory:  clear to auscultation bilaterally, normal work of breathing GI: soft, nontender, nondistended, + BS MS: no deformity or atrophy  Skin: warm and dry, no rash Neuro:  Strength and sensation are intact Psych: euthymic mood, full affect   EKG:  EKG is ordered today. The ekg ordered today demonstrates normal sinus rhythm, left axis deviation with right bundle branch block nonspecific ST changes.  Recent Labs: 06/01/2016: ALT 43; BUN 9; Creatinine, Ser 0.82; Potassium 4.4; Sodium 134 09/15/2016: Hemoglobin 13.5; Platelets 192; TSH 4.755    Lipid Panel    Component Value Date/Time   CHOL 172 06/01/2016 0846   TRIG 130 06/01/2016 0846   HDL 35 (L) 06/01/2016 0846   CHOLHDL 4.9 06/01/2016 0846   VLDL 26 06/01/2016 0846   LDLCALC 111 (H) 06/01/2016 0846      Wt Readings from Last 3 Encounters:  05/24/17 172 lb 12 oz (78.4 kg)  09/20/15 161 lb 11.2 oz (73.3 kg)       PAD Screen 05/24/2017  Previous PAD dx? No  Previous surgical procedure? No  Pain with walking? No  Feet/toe relief with dangling? No  Painful,  non-healing ulcers? No  Extremities discolored? No      ASSESSMENT AND PLAN:  1.  Exertional fatigue and sweating: Rule out angina equivalent especially with prolonged history of type 2 diabetes.  His EKG is abnormal with right bundle branch block and nonspecific ST changes.  Due to all of the above, I recommend evaluation with a treadmill nuclear stress test.  I advised him to start taking aspirin 81 mg once daily. I discussed with the patient the importance of lifestyle changes in order to decrease the chance of future coronary artery disease and cardiovascular events. We discussed the importance of controlling risk factors, healthy diet as well as regular exercise.  2.  Elevated blood pressure without history of hypertension: Continue to monitor closely and consider adding an ACE inhibitor or ARB.  3.  Type 2 diabetes: Due to this, consider treatment with a statin given that diabetes mellitus is coronary artery disease equivalent.     Disposition:   FU with me as needed.   Signed,  Travis Sacramento, MD  05/24/2017 4:33 PM    [place]

## 2017-05-24 NOTE — Patient Instructions (Addendum)
Medication Instructions:  Your physician has recommended you make the following change in your medication:  1- START Aspirin 81 mg by mouth once a day.   Labwork: NONE  Testing/Procedures: [name]  Your caregiver has ordered a Stress Test with nuclear imaging. The purpose of this test is to evaluate the blood supply to your heart muscle. This procedure is referred to as a "Non-Invasive Stress Test." This is because other than having an IV started in your vein, nothing is inserted or "invades" your body. Cardiac stress tests are done to find areas of poor blood flow to the heart by determining the extent of coronary artery disease (CAD). Some patients exercise on a treadmill, which naturally increases the blood flow to your heart, while others who are  unable to walk on a treadmill due to physical limitations have a pharmacologic/chemical stress agent called Lexiscan . This medicine will mimic walking on a treadmill by temporarily increasing your coronary blood flow.   Please note: these test may take anywhere between 2-4 hours to complete  PLEASE REPORT TO [facility] AT THE FIRST DESK WILL DIRECT YOU WHERE TO GO  Date of Procedure:_____12/26/18____________  Arrival Time for Procedure:______07:45 am__________  Instructions regarding medication:   _XX_ : Hold diabetes medication (Glipizide) morning of procedure   PLEASE NOTIFY THE OFFICE AT LEAST 24 HOURS IN ADVANCE IF YOU ARE UNABLE TO KEEP YOUR APPOINTMENT.  [phone] AND  PLEASE NOTIFY NUCLEAR MEDICINE AT [facility] AT LEAST 24 HOURS IN ADVANCE IF YOU ARE UNABLE TO KEEP YOUR APPOINTMENT. [phone]  How to prepare for your Myoview test:  1. Do not eat or drink after midnight 2. No caffeine for 24 hours prior to test 3. No smoking 24 hours prior to test. 4. Your medication may be taken with water.  If your doctor stopped a medication because of this test, do not take that medication. 5. Ladies,  please do not wear dresses.  Skirts or pants are appropriate. Please wear a short sleeve shirt. 6. No perfume, cologne or lotion. 7. Wear comfortable walking shoes. No heels!    Follow-Up: Your physician recommends that you schedule a follow-up appointment in: AS NEEDED.    Cardiac Nuclear Scan A cardiac nuclear scan is a test that measures blood flow to the heart when a person is resting and when he or she is exercising. The test looks for problems such as:  Not enough blood reaching a portion of the heart.  The heart muscle not working normally.  You may need this test if:  You have heart disease.  You have had abnormal lab results.  You have had heart surgery or angioplasty.  You have chest pain.  You have shortness of breath.  In this test, a radioactive dye (tracer) is injected into your bloodstream. After the tracer has traveled to your heart, an imaging device is used to measure how much of the tracer is absorbed by or distributed to various areas of your heart. This procedure is usually done at a hospital and takes 2-4 hours. Tell a health care provider about:  Any allergies you have.  All medicines you are taking, including vitamins, herbs, eye drops, creams, and over-the-counter medicines.  Any problems you or family members have had with the use of anesthetic medicines.  Any blood disorders you have.  Any surgeries you have had.  Any medical conditions you have.  Whether you are pregnant or may be pregnant. What are the risks?  Generally, this is a safe procedure. However, problems may occur, including:  Serious chest pain and heart attack. This is only a risk if the stress portion of the test is done.  Rapid heartbeat.  Sensation of warmth in your chest. This usually passes quickly.  What happens before the procedure?  Ask your health care provider about changing or stopping your regular medicines. This is especially important if you are taking  diabetes medicines or blood thinners.  Remove your jewelry on the day of the procedure. What happens during the procedure?  An IV tube will be inserted into one of your veins.  Your health care provider will inject a small amount of radioactive tracer through the tube.  You will wait for 20-40 minutes while the tracer travels through your bloodstream.  Your heart activity will be monitored with an electrocardiogram (ECG).  You will lie down on an exam table.  Images of your heart will be taken for about 15-20 minutes.  You may be asked to exercise on a treadmill or stationary bike. While you exercise, your heart's activity will be monitored with an ECG, and your blood pressure will be checked. If you are unable to exercise, you may be given a medicine to increase blood flow to parts of your heart.  When blood flow to your heart has peaked, a tracer will again be injected through the IV tube.  After 20-40 minutes, you will get back on the exam table and have more images taken of your heart.  When the procedure is over, your IV tube will be removed. The procedure may vary among health care providers and hospitals. Depending on the type of tracer used, scans may need to be repeated 3-4 hours later. What happens after the procedure?  Unless your health care provider tells you otherwise, you may return to your normal schedule, including diet, activities, and medicines.  Unless your health care provider tells you otherwise, you may increase your fluid intake. This will help flush the contrast dye from your body. Drink enough fluid to keep your urine clear or pale yellow.  It is up to you to get your test results. Ask your health care provider, or the department that is doing the test, when your results will be ready. Summary  A cardiac nuclear scan measures the blood flow to the heart when a person is resting and when he or she is exercising.  You may need this test if you are at risk  for heart disease.  Tell your health care provider if you are pregnant.  Unless your health care provider tells you otherwise, increase your fluid intake. This will help flush the contrast dye from your body. Drink enough fluid to keep your urine clear or pale yellow. This information is not intended to replace advice given to you by your health care provider. Make sure you discuss any questions you have with your health care provider. Document Released: 06/23/2004 Document Revised: 05/31/2016 Document Reviewed: 05/07/2013 Elsevier Interactive Patient Education  2017 [employer].

## 2017-06-06 ENCOUNTER — Ambulatory Visit: Payer: Medicare Other | Admitting: Cardiovascular Disease

## 2017-06-06 ENCOUNTER — Encounter
Admission: RE | Admit: 2017-06-06 | Discharge: 2017-06-06 | Disposition: A | Discharge status: Home / Self Care | Payer: Medicare Other | Source: Ambulatory Visit | Attending: Cardiovascular Disease | Admitting: Cardiovascular Disease

## 2017-06-06 DIAGNOSIS — R0602 Shortness of breath: Secondary | ICD-10-CM | POA: Diagnosis not present

## 2017-06-06 MED ORDER — TECHNETIUM TC 99M TETROFOSMIN IV KIT
32.6940 | PACK | Freq: Once | INTRAVENOUS | Status: AC | PRN
Start: 2017-06-06 — End: 2017-06-06
  Administered 2017-06-06: 32.694 via INTRAVENOUS

## 2017-06-06 MED ORDER — TECHNETIUM TC 99M TETROFOSMIN IV KIT
13.0000 | PACK | Freq: Once | INTRAVENOUS | Status: AC | PRN
  Administered 2017-06-06: 12.424 via INTRAVENOUS

## 2017-06-07 LAB — NM MYOCAR MULTI W/SPECT W/WALL MOTION / EF
CHL CUP NUCLEAR SDS: 18
CHL CUP NUCLEAR SSS: 19
CSEPEW: 8.5 METS
Exercise duration (min): 7 min
Exercise duration (sec): 31 s
LV dias vol: 103 mL (ref 62–150)
LV sys vol: 40 mL
NUC STRESS TID: 1
Peak HR: 148 {beats}/min
Percent HR: 97 %
Rest HR: 45 {beats}/min
SRS: 6

## 2017-06-08 ENCOUNTER — Telehealth: Payer: Self-pay

## 2017-06-08 ENCOUNTER — Other Ambulatory Visit: Payer: Self-pay

## 2017-06-08 DIAGNOSIS — E119 Type 2 diabetes mellitus without complications: Secondary | ICD-10-CM | POA: Diagnosis not present

## 2017-06-08 DIAGNOSIS — R0602 Shortness of breath: Secondary | ICD-10-CM

## 2017-06-08 DIAGNOSIS — Z01812 Encounter for preprocedural laboratory examination: Secondary | ICD-10-CM

## 2017-06-08 MED ORDER — ROSUVASTATIN CALCIUM 20 MG PO TABS: 20.0000 mg | ORAL_TABLET | Freq: Every day | ORAL | 3 refills | Status: DC

## 2017-06-08 NOTE — Patient Instructions (Addendum)
Angiogram An angiogram is an X-ray test. It is used to look at your blood vessels. For this test, a dye is put into the blood vessel being checked. The dye shows up on X-rays. It helps your doctor see if there is a blockage or other problem in the blood vessel. What happens before the procedure?  Follow your doctor's instructions about limiting what you eat or drink.  Ask your doctor if you may drink enough water to take any needed medicines the morning of the test.  Plan to have someone take you home after the test.  If you go home the same day as the test, plan to have someone stay with you for 24 hours. What happens during the procedure?  An IV tube will be put into one of your veins.  You will be given a medicine that makes you relax (sedative).  Your skin will be washed where the thin tube (catheter) will be put in. Hair may be removed from this area. The tube may be put into: ? Your upper leg area (groin). ? The fold of your arm, near your elbow. ? Your wrist.  You will be given a medicine that numbs the area where the tube will be inserted (local anesthetic).  The tube will be inserted into a blood vessel.  Using a type of X-ray (fluoroscopy) to see, your doctor will move the tube into the blood vessel to check it.  Dye will be put in through the tube. X-rays of your blood vessels will then be taken. Different doctors and hospitals may do this procedure differently. What happens after the procedure?  If the test is done through the leg, you will be kept in bed lying flat for several hours. You will be told to not bend or cross your legs.  The area where the tube was inserted will be checked often.  The pulse in your feet or wrist will be checked often.  More tests or X-rays may be done. This information is not intended to replace advice given to you by your health care provider. Make sure you discuss any questions you have with your health care provider. Document  Released: 08/25/2008 Document Revised: 11/04/2015 Document Reviewed: 10/30/2012 Elsevier Interactive Patient Education  2017 [place] After This sheet gives you information about how to care for yourself after your procedure. Your doctor may also give you more specific instructions. If you have problems or questions, contact your doctor. Follow these instructions at home: Insertion site care  Follow instructions from your doctor about how to take care of your long, thin tube (catheter) insertion area. Make sure you: ? Wash your hands with soap and water before you change your bandage (dressing). If you cannot use soap and water, use hand sanitizer. ? Change your bandage as told by your doctor. ? Leave stitches (sutures), skin glue, or skin tape (adhesive) strips in place. They may need to stay in place for 2 weeks or longer. If tape strips get loose and curl up, you may trim the loose edges. Do not remove tape strips completely unless your doctor says it is okay.  Do not take baths, swim, or use a hot tub until your doctor says it is okay.  You may shower 24-48 hours after the procedure or as told by your doctor. ? Gently wash the area with plain soap and water. ? Pat the area dry with a clean towel. ? Do not rub the area. This may  cause bleeding.  Do not apply powder or lotion to the area. Keep the area clean and dry.  Check your insertion area every day for signs of infection. Check for: ? More redness, swelling, or pain. ? Fluid or blood. ? Warmth. ? Pus or a bad smell. Activity  Rest as told by your doctor, usually for 1-2 days.  Do not lift anything that is heavier than 10 lbs. (4.5 kg) or as told by your doctor.  Do not drive for 24 hours if you were given a medicine to help you relax (sedative).  Do not drive or use heavy machinery while taking prescription pain medicine. General instructions  Go back to your normal activities as told by your  doctor, usually in about a week. Ask your doctor what activities are safe for you.  If the insertion area starts to bleed, lie flat and put pressure on the area. If the bleeding does not stop, get help right away. This is an emergency.  Drink enough fluid to keep your pee (urine) clear or pale yellow.  Take over-the-counter and prescription medicines only as told by your doctor.  Keep all follow-up visits as told by your doctor. This is important. Contact a doctor if:  You have a fever.  You have chills.  You have more redness, swelling, or pain around your insertion area.  You have fluid or blood coming from your insertion area.  The insertion area feels warm to the touch.  You have pus or a bad smell coming from your insertion area.  You have more bruising around the insertion area.  Blood collects in the tissue around the insertion area (hematoma) that may be painful to the touch. Get help right away if:  You have a lot of pain in the insertion area.  The insertion area swells very fast.  The insertion area is bleeding, and the bleeding does not stop after holding steady pressure on the area.  The area near or just beyond the insertion area becomes pale, cool, tingly, or numb. These symptoms may be an emergency. Do not wait to see if the symptoms will go away. Get medical help right away. Call your local emergency services (911 in the [place] Do not drive yourself to the hospital. Summary  After the procedure, it is common to have bruising and tenderness at the long, thin tube insertion area.  After the procedure, it is important to rest and drink plenty of fluids.  Do not take baths, swim, or use a hot tub until your doctor says it is okay to do so. You may shower 24-48 hours after the procedure or as told by your doctor.  If the insertion area starts to bleed, lie flat and put pressure on the area. If the bleeding does not stop, get help right away. This is an  emergency. This information is not intended to replace advice given to you by your health care provider. Make sure you discuss any questions you have with your health care provider. Document Released: 08/25/2008 Document Revised: 05/23/2016 Document Reviewed: 05/23/2016 Elsevier Interactive Patient Education  2017 [employer].

## 2017-06-08 NOTE — Telephone Encounter (Signed)
Pt agreeable to 1/7 left heart cath and possible PCI.  See results note.

## 2017-06-11 ENCOUNTER — Other Ambulatory Visit: Payer: Self-pay

## 2017-06-11 ENCOUNTER — Telehealth: Payer: Self-pay | Admitting: Cardiovascular Disease

## 2017-06-11 ENCOUNTER — Ambulatory Visit (INDEPENDENT_AMBULATORY_CARE_PROVIDER_SITE_OTHER): Payer: Medicare Other

## 2017-06-11 DIAGNOSIS — R0602 Shortness of breath: Secondary | ICD-10-CM

## 2017-06-11 LAB — ECHOCARDIOGRAM COMPLETE
AOASC: 37 cm
AV Area VTI index: 1.96 cm2/m2
AV Area mean vel: 3.56 cm2
AV VEL mean LVOT/AV: 0.86
AV area mean vel ind: 1.91 cm2/m2
AVA: 3.65 cm2
AVG: 3 mmHg
CHL CUP AV VEL: 3.65
DOP CAL AO MEAN VELOCITY: 84.7 cm/s
EERAT: 9.81
EWDT: 250 ms
FS: 35 % (ref 28–44)
IVS/LV PW RATIO, ED: 1.36
LA diam index: 1.94 cm/m2
LA vol A4C: 28.5 ml
LA vol index: 19.4 mL/m2
LA vol: 36 mL
LASIZE: 36 mm
LEFT ATRIUM END SYS DIAM: 36 mm
LV PW d: 14 mm — AB (ref 0.6–1.1)
LV TDI E'LATERAL: 5.34
LV e' LATERAL: 5.34 cm/s
LVEEAVG: 9.81
LVEEMED: 9.81
LVOT SV: 91 mL
LVOT VTI: 21.9 cm
LVOT area: 4.15 cm2
LVOT diameter: 23 mm
LVOT peak VTI: 0.88 cm
MV Dec: 250
MVAP: 3.06 cm2
MVPKAVEL: 87.3 m/s
MVPKEVEL: 52.4 m/s
MVSPHT: 73 ms
RV LATERAL S' VELOCITY: 15.3 cm/s
RV TAPSE: 19.4 mm
TDI e' medial: 5.26
VTI: 24.9 cm
Valve area index: 1.96

## 2017-06-11 NOTE — Telephone Encounter (Signed)
Pt in office today for echo. Met with him to review Jan 7 left heart cath instructions and need for labs and CXR. 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 I explained the importance of this procedure as stress test was abnormal and suggestive of 2 blockages. He again states he will notify our office when he returns from [place] which may be as soon as February but as late as April. I have left a message for Scheduling at [facility]. Will route to Dr. Fletcher Anon to make aware.

## 2017-06-13 DIAGNOSIS — I10 Essential (primary) hypertension: Secondary | ICD-10-CM | POA: Insufficient documentation

## 2017-06-13 DIAGNOSIS — E538 Deficiency of other specified B group vitamins: Secondary | ICD-10-CM | POA: Insufficient documentation

## 2017-06-13 DIAGNOSIS — E1140 Type 2 diabetes mellitus with diabetic neuropathy, unspecified: Secondary | ICD-10-CM | POA: Insufficient documentation

## 2017-06-14 ENCOUNTER — Ambulatory Visit: Payer: Medicare Other | Admitting: Nurse Practitioner

## 2017-06-14 ENCOUNTER — Encounter: Payer: Self-pay | Admitting: Nurse Practitioner

## 2017-06-14 VITALS — BP 140/84 | HR 52 | Resp 16 | Ht 65.0 in | Wt 175.4 lb

## 2017-06-14 DIAGNOSIS — E1165 Type 2 diabetes mellitus with hyperglycemia: Secondary | ICD-10-CM

## 2017-06-14 DIAGNOSIS — E039 Hypothyroidism, unspecified: Secondary | ICD-10-CM | POA: Diagnosis not present

## 2017-06-14 DIAGNOSIS — I10 Essential (primary) hypertension: Secondary | ICD-10-CM | POA: Diagnosis not present

## 2017-06-14 DIAGNOSIS — E538 Deficiency of other specified B group vitamins: Secondary | ICD-10-CM

## 2017-06-14 LAB — POCT GLYCOSYLATED HEMOGLOBIN (HGB A1C): HEMOGLOBIN A1C: 7.2

## 2017-06-14 MED ORDER — GLUCOSE BLOOD VI STRP: ORAL_STRIP | 11 refills | Status: DC

## 2017-06-14 MED ORDER — CYANOCOBALAMIN 1000 MCG/ML IJ SOLN
1000.0000 ug | Freq: Once | INTRAMUSCULAR | Status: AC
  Administered 2017-06-14: 1000 ug via INTRAMUSCULAR

## 2017-06-14 MED ORDER — LEVOTHYROXINE SODIUM 50 MCG PO TABS: 50.0000 ug | ORAL_TABLET | Freq: Every day | ORAL | 4 refills | Status: DC

## 2017-06-14 MED ORDER — GLIPIZIDE 5 MG PO TABS: 5.0000 mg | ORAL_TABLET | Freq: Two times a day (BID) | ORAL | 4 refills | Status: DC

## 2017-06-14 NOTE — Progress Notes (Signed)
[facility] [place], [place] [id number]  Internal MEDICINE  Office Visit Note  Patient Name: Travis Higgins  [id number]  [id number]  Date of Service: 06/14/2017     Complaints/HPI Pt is here for routine follow up.  The patient is here for routine follow up. Fasting, AM blood sugars are generally under 100. Patient will be going to [place] for a month, later in January. He needs to have new prescriptions to take with him.     Current Medication: Outpatient Encounter Medications as of 06/14/2017  Medication Sig Note  . acetaminophen (TYLENOL) 500 MG tablet Take 500 mg by mouth daily as needed for moderate pain or headache.   Marland Kitchen aspirin EC 81 MG tablet Take 1 tablet (81 mg total) by mouth daily.   Marland Kitchen glipiZIDE (GLUCOTROL) 5 MG tablet Take 5 mg by mouth 2 (two) times daily.   Marland Kitchen levothyroxine (SYNTHROID, LEVOTHROID) 50 MCG tablet Take 50 mcg by mouth daily before breakfast.   . rosuvastatin (CRESTOR) 20 MG tablet Take 1 tablet (20 mg total) by mouth daily. 06/08/2017: Hasnt started  . [EXPIRED] cyanocobalamin ((VITAMIN B-12)) injection 1,000 mcg     No facility-administered encounter medications on file as of 06/14/2017.     Surgical History: Past Surgical History:  Procedure Laterality Date  . COLONOSCOPY    . COLONOSCOPY WITH PROPOFOL N/A 09/20/2015   Procedure: COLONOSCOPY WITH PROPOFOL;  Surgeon: Travis Lame, MD;  Location: [place];  Service: Endoscopy;  Laterality: N/A;  . POLYPECTOMY  09/20/2015   Procedure: POLYPECTOMY INTESTINAL;  Surgeon: Travis Lame, MD;  Location: [facility];  Service: Endoscopy;;  Sigmoid colon polyp x 1 Rectal polyp x 2    Medical History: Past Medical History:  Diagnosis Date  . Anemia    in past  . Diabetes mellitus without complication ([place])   . Thyroid disease     Family History: No family history on file.  Social History   Socioeconomic History  . Marital status: Married    Spouse name: Not on  file  . Number of children: Not on file  . Years of education: Not on file  . Highest education level: Not on file  Social Needs  . Financial resource strain: Not on file  . Food insecurity - worry: Not on file  . Food insecurity - inability: Not on file  . Transportation needs - medical: Not on file  . Transportation needs - non-medical: Not on file  Occupational History  . Not on file  Tobacco Use  . Smoking status: Former Research scientist (life sciences)  . Smokeless tobacco: Never Used  . Tobacco comment: quit approx 2013  Substance and Sexual Activity  . Alcohol use: No  . Drug use: No  . Sexual activity: Not on file  Other Topics Concern  . Not on file  Social History Narrative  . Not on file   Today's Vitals   06/14/17 1552  BP: 140/84  Pulse: (!) 52  Resp: 16  SpO2: 99%  Weight: 175 lb 6.4 oz (79.6 kg)  Height: 5\' 5"  (1.651 m)    Review of Systems  Constitutional: Negative for chills and fever.  HENT: Negative.  Negative for congestion and sinus pain.   Eyes: Negative.   Respiratory: Negative for cough and wheezing.   Cardiovascular: Positive for chest pain and orthopnea. Negative for palpitations.       Patient seeing Dr. Fletcher Higgins, cardiology, for issues with intermittent chest pain and SOB. Will be scheduled for  a cardiac catheterization later this year.   Gastrointestinal: Negative for diarrhea, nausea and vomiting.  Genitourinary: Negative.   Musculoskeletal: Negative for back pain and myalgias.  Skin: Negative for rash.  Neurological: Negative for dizziness, weakness and headaches.  Endo/Heme/Allergies:       Blood sugars are doing well, overall.   Psychiatric/Behavioral: Negative for depression. The patient is not nervous/anxious.   All other systems reviewed and are negative.    Physical Exam  Constitutional: He is oriented to person, place, and time and well-developed, well-nourished, and in no distress.  HENT:  Head: Normocephalic and atraumatic.  Eyes: Pupils are equal,  round, and reactive to light.  Neck: Normal range of motion. Neck supple. No thyromegaly present.  Cardiovascular: Normal rate, regular rhythm and normal heart sounds.  Pulmonary/Chest: Effort normal and breath sounds normal. He has no wheezes.  Abdominal: Soft. Bowel sounds are normal. There is no tenderness.  Musculoskeletal: Normal range of motion.  Lymphadenopathy:    He has no cervical adenopathy.  Neurological: He is alert and oriented to person, place, and time.  Skin: Skin is warm and dry.  Psychiatric: Affect normal.  Nursing note and vitals reviewed.    ICD-10-CM   1. Type 2 diabetes mellitus with hyperglycemia, unspecified whether long term insulin use (HCC) E11.65 POCT HgB A1C    glucose blood (ACCU-CHEK AVIVA) test strip  2. B12 deficiency E53.8 cyanocobalamin ((VITAMIN B-12)) injection 1,000 mcg  3. Acquired hypothyroidism E03.9 levothyroxine (SYNTHROID, LEVOTHROID) 50 MCG tablet  4. Essential hypertension I10    1. HgbA1c 7.2 today. No changes made to medications. Reviewed importance of following ADA diet and participation in regular exercise. 2. Vitamin B12 injection administered today. Continue monthly b12 shots 3. Thyroid panel stable. Continue levothyroxine as prescribed. Refills provided today.  4. BP generally stable. contineu blood pressure meds as prescribed.  He should follow up in 3 months and sooner if needed.   General Counseling: I have discussed the findings of the evaluation and examination with Travis Higgins.  I have also discussed any further diagnostic evaluation that may be needed or ordered today. Travis Higgins verbalizes understanding of the findings of todays visit. We also reviewed her medications today. she has been encouraged to call the office with any questions or concerns that should arise related to todays visit.  Diabetes Counseling:  1. Addition of ACE inh/ ARB'S for nephroprotection. 2. Diabetic foot care, prevention of complications.  3.Exercise  and lose weight.  4. Diabetic eye examination, 5. Monitor blood sugar closlely. nutrition counseling.  6.Sign and symptoms of hypoglycemia including shaking sweating,confusion and headaches.  This patient was seen by Leretha Pol, FNP- C in Collaboration with Dr Lavera Guise as a part of collaborative care agreement    Patient Active Problem List   Diagnosis Date Noted  . Type 2 diabetes mellitus with diabetic neuropathy, unspecified ([place]) 06/13/2017  . Vitamin B 12 deficiency 06/13/2017  . Essential hypertension 06/13/2017  . Special screening for malignant neoplasms, colon   . Benign neoplasm of sigmoid colon   . Rectal polyp   . Spondylosis of lumbar region without myelopathy or radiculopathy 01/11/2015  . Morton's neuroma of second interspace of right foot 08/31/2014  . Primary osteoarthritis of right knee 08/31/2014  . Patellofemoral dysfunction of right knee 08/31/2014

## 2017-06-18 ENCOUNTER — Ambulatory Visit: Admit: 2017-06-18 | Payer: Medicare Other | Admitting: Cardiovascular Disease

## 2017-06-18 SURGERY — CORONARY STENT INTERVENTION
Anesthesia: Moderate Sedation

## 2017-06-21 ENCOUNTER — Telehealth: Payer: Self-pay

## 2017-06-21 NOTE — Telephone Encounter (Signed)
Spoke with Travis Higgins had stress done by dr Fletcher Anon is abnormal and he has blockages. I advised Travis Higgins he  is not able to travel [place] because he might have heart attack in air while traveling and said he is ok he has no chest pain and no sob and is not agree he still travelling to [place] we also advised him that he travelling on his own risks and advised by tat and Jaspreet Hollings in [place] and [place]

## 2017-06-25 NOTE — Telephone Encounter (Signed)
I forwarded this info to the patient's PCP.

## 2017-07-10 ENCOUNTER — Other Ambulatory Visit: Payer: Self-pay

## 2017-07-10 MED ORDER — ACCU-CHEK FASTCLIX LANCET KIT: PACK | 5 refills | Status: DC

## 2017-07-17 ENCOUNTER — Other Ambulatory Visit: Payer: Self-pay

## 2017-07-17 DIAGNOSIS — E1165 Type 2 diabetes mellitus with hyperglycemia: Secondary | ICD-10-CM

## 2017-07-17 MED ORDER — ACCU-CHEK FASTCLIX LANCET KIT: PACK | 5 refills | Status: DC

## 2017-07-17 MED ORDER — GLUCOSE BLOOD VI STRP: ORAL_STRIP | 11 refills | Status: DC

## 2017-07-27 ENCOUNTER — Other Ambulatory Visit: Payer: Self-pay

## 2017-07-30 ENCOUNTER — Other Ambulatory Visit: Payer: Self-pay

## 2017-07-30 MED ORDER — ACCU-CHEK FASTCLIX LANCET KIT: PACK | 5 refills | Status: DC

## 2017-08-01 ENCOUNTER — Other Ambulatory Visit: Payer: Self-pay

## 2017-08-01 MED ORDER — ACCU-CHEK FASTCLIX LANCET KIT: PACK | 5 refills | Status: DC

## 2017-08-08 ENCOUNTER — Other Ambulatory Visit: Payer: Self-pay

## 2017-08-08 MED ORDER — ACCU-CHEK FASTCLIX LANCET KIT: PACK | 5 refills | Status: DC

## 2017-10-04 ENCOUNTER — Encounter: Payer: Self-pay | Admitting: Nurse Practitioner

## 2017-10-04 ENCOUNTER — Ambulatory Visit (INDEPENDENT_AMBULATORY_CARE_PROVIDER_SITE_OTHER): Payer: Medicare Other | Admitting: Nurse Practitioner

## 2017-10-04 VITALS — BP 122/78 | HR 63 | Resp 16 | Ht 65.0 in | Wt 176.0 lb

## 2017-10-04 DIAGNOSIS — E1165 Type 2 diabetes mellitus with hyperglycemia: Secondary | ICD-10-CM | POA: Diagnosis not present

## 2017-10-04 DIAGNOSIS — E039 Hypothyroidism, unspecified: Secondary | ICD-10-CM | POA: Diagnosis not present

## 2017-10-04 DIAGNOSIS — E782 Mixed hyperlipidemia: Secondary | ICD-10-CM

## 2017-10-04 LAB — POCT GLYCOSYLATED HEMOGLOBIN (HGB A1C): Hemoglobin A1C: 7.9

## 2017-10-04 MED ORDER — ROSUVASTATIN CALCIUM 20 MG PO TABS: 20.0000 mg | ORAL_TABLET | Freq: Every day | ORAL | 4 refills | Status: DC

## 2017-10-04 MED ORDER — GLUCOSE BLOOD VI STRP: ORAL_STRIP | 12 refills | Status: DC

## 2017-10-04 MED ORDER — LEVOTHYROXINE SODIUM 50 MCG PO TABS: 50.0000 ug | ORAL_TABLET | Freq: Every day | ORAL | 4 refills | Status: DC

## 2017-10-04 MED ORDER — GLIPIZIDE 5 MG PO TABS: 5.0000 mg | ORAL_TABLET | Freq: Two times a day (BID) | ORAL | 4 refills | Status: DC

## 2017-10-04 MED ORDER — ACCU-CHEK FASTCLIX LANCET KIT: 1.0000 | PACK | Freq: Every day | 5 refills | Status: DC

## 2017-10-04 NOTE — Progress Notes (Signed)
[facility] [place], [place] [address]  Internal MEDICINE  Office Visit Note  Patient Name: Travis Higgins  [id number]  [id number]  Date of Service: 10/24/2017    Pt is here for routine follow up.   Chief Complaint  Patient presents with  . Diabetes  . Anemia    Diabetes  He presents for his follow-up diabetic visit. He has type 2 diabetes mellitus. No MedicAlert identification noted. His disease course has been stable. There are no hypoglycemic associated symptoms. Pertinent negatives for hypoglycemia include no dizziness, headaches, nervousness/anxiousness or tremors. There are no diabetic associated symptoms. Pertinent negatives for diabetes include no chest pain, no fatigue, no polydipsia, no polyphagia and no polyuria. Symptoms are stable. There are no diabetic complications. Risk factors for coronary artery disease include hypertension, dyslipidemia and diabetes mellitus. Current diabetic treatment includes oral agent (monotherapy). He is compliant with treatment most of the time. His weight is stable. He is following a generally healthy diet. Meal planning includes avoidance of concentrated sweets. He has not had a previous visit with a dietitian. He participates in exercise intermittently. There is no change in his home blood glucose trend. An ACE inhibitor/angiotensin II receptor blocker is not being taken. He does not see a podiatrist.Eye exam is current.      Current Medication: Outpatient Encounter Medications as of 10/04/2017  Medication Sig Note  . acetaminophen (TYLENOL) 500 MG tablet Take 500 mg by mouth daily as needed for moderate pain or headache.   Marland Kitchen aspirin EC 81 MG tablet Take 1 tablet (81 mg total) by mouth daily.   Marland Kitchen glipiZIDE (GLUCOTROL) 5 MG tablet Take 1 tablet (5 mg total) by mouth 2 (two) times daily.   Marland Kitchen levothyroxine (SYNTHROID, LEVOTHROID) 50 MCG tablet Take 1 tablet (50 mcg total) by mouth daily before breakfast.   .  rosuvastatin (CRESTOR) 20 MG tablet Take 1 tablet (20 mg total) by mouth daily.   . [DISCONTINUED] glipiZIDE (GLUCOTROL) 5 MG tablet Take 1 tablet (5 mg total) by mouth 2 (two) times daily.   . [DISCONTINUED] glucose blood (ACCU-CHEK AVIVA) test strip Blood sugar testing should be done BID and as needed   . [DISCONTINUED] glucose blood (ACCU-CHEK GUIDE) test strip Blood sugar testing daily   . [DISCONTINUED] Lancets Misc. (ACCU-CHEK FASTCLIX LANCET) KIT Use as directed to check blood sugars twice a day. E11.65   . [DISCONTINUED] Lancets Misc. (ACCU-CHEK FASTCLIX LANCET) KIT 1 each by Does not apply route daily.   . [DISCONTINUED] levothyroxine (SYNTHROID, LEVOTHROID) 50 MCG tablet Take 1 tablet (50 mcg total) by mouth daily before breakfast.   . [DISCONTINUED] rosuvastatin (CRESTOR) 20 MG tablet Take 1 tablet (20 mg total) by mouth daily. 06/08/2017: Hasnt started   No facility-administered encounter medications on file as of 10/04/2017.     Surgical History: Past Surgical History:  Procedure Laterality Date  . COLONOSCOPY    . COLONOSCOPY WITH PROPOFOL N/A 09/20/2015   Procedure: COLONOSCOPY WITH PROPOFOL;  Surgeon: Lucilla Lame, MD;  Location: [place];  Service: Endoscopy;  Laterality: N/A;  . POLYPECTOMY  09/20/2015   Procedure: POLYPECTOMY INTESTINAL;  Surgeon: Lucilla Lame, MD;  Location: [place];  Service: Endoscopy;;  Sigmoid colon polyp x 1 Rectal polyp x 2    Medical History: Past Medical History:  Diagnosis Date  . Anemia    in past  . Diabetes mellitus without complication ([place])   . Thyroid disease     Family History: No family history  on file.  Social History   Socioeconomic History  . Marital status: Married    Spouse name: Not on file  . Number of children: Not on file  . Years of education: Not on file  . Highest education level: Not on file  Occupational History  . Not on file  Social Needs  . Financial resource strain: Not on file  .  Food insecurity:    Worry: Not on file    Inability: Not on file  . Transportation needs:    Medical: Not on file    Non-medical: Not on file  Tobacco Use  . Smoking status: Former Research scientist (life sciences)  . Smokeless tobacco: Never Used  . Tobacco comment: quit approx 2013  Substance and Sexual Activity  . Alcohol use: No  . Drug use: No  . Sexual activity: Not on file  Lifestyle  . Physical activity:    Days per week: Not on file    Minutes per session: Not on file  . Stress: Not on file  Relationships  . Social connections:    Talks on phone: Not on file    Gets together: Not on file    Attends religious service: Not on file    Active member of club or organization: Not on file    Attends meetings of clubs or organizations: Not on file    Relationship status: Not on file  . Intimate partner violence:    Fear of current or ex partner: Not on file    Emotionally abused: Not on file    Physically abused: Not on file    Forced sexual activity: Not on file  Other Topics Concern  . Not on file  Social History Narrative  . Not on file      Review of Systems  Constitutional: Negative for activity change, chills, fatigue and unexpected weight change.  HENT: Negative for congestion, postnasal drip, rhinorrhea, sneezing and sore throat.   Eyes: Negative.  Negative for redness.  Respiratory: Negative for cough, chest tightness, shortness of breath and wheezing.   Cardiovascular: Negative for chest pain and palpitations.  Gastrointestinal: Negative for abdominal pain, constipation, diarrhea, nausea and vomiting.  Endocrine: Negative for cold intolerance, heat intolerance, polydipsia, polyphagia and polyuria.       Blood sugars doing well   Genitourinary: Negative for dysuria and frequency.  Musculoskeletal: Negative for arthralgias, back pain, joint swelling and neck pain.  Skin: Negative for rash.  Allergic/Immunologic: Negative for environmental allergies.  Neurological: Negative for  dizziness, tremors, numbness and headaches.  Hematological: Negative for adenopathy. Does not bruise/bleed easily.  Psychiatric/Behavioral: Negative for behavioral problems (Depression), dysphoric mood, sleep disturbance and suicidal ideas. The patient is not nervous/anxious.     Vital Signs: BP 122/78   Pulse 63   Resp 16   Ht '5\' 5"'$  (1.651 m)   Wt 176 lb (79.8 kg)   SpO2 97%   BMI 29.29 kg/m    Physical Exam  Constitutional: He is oriented to person, place, and time. He appears well-developed and well-nourished. No distress.  HENT:  Head: Normocephalic and atraumatic.  Mouth/Throat: Oropharynx is clear and moist. No oropharyngeal exudate.  Eyes: Pupils are equal, round, and reactive to light. Conjunctivae and EOM are normal.  Neck: Normal range of motion. Neck supple. Normal carotid pulses and no JVD present. No tracheal deviation present. No thyromegaly present.  Cardiovascular: Normal rate, regular rhythm and normal heart sounds. Exam reveals no gallop and no friction rub.  No murmur heard.  Pulmonary/Chest: Effort normal and breath sounds normal. No respiratory distress. He has no wheezes. He has no rales. He exhibits no tenderness.  Abdominal: Soft. Bowel sounds are normal. There is no tenderness.  Musculoskeletal: Normal range of motion.  Lymphadenopathy:    He has no cervical adenopathy.  Neurological: He is alert and oriented to person, place, and time. No cranial nerve deficit.  Skin: Skin is warm and dry. He is not diaphoretic.  Psychiatric: He has a normal mood and affect. His behavior is normal. Judgment and thought content normal.  Nursing note and vitals reviewed.  Assessment/Plan:  1. Uncontrolled type 2 diabetes mellitus with hyperglycemia (HCC) - POCT HgB A1C 7.9 today. Discussed importance of following ADA diet and incorporating regular exercise into daily life.   2. Type 2 diabetes mellitus with hyperglycemia, unspecified whether long term insulin use  (HCC) increse glipizide '5mg'$  to twice daily. Diet and lifestyle recommendations discussed.  - glipiZIDE (GLUCOTROL) 5 MG tablet; Take 1 tablet (5 mg total) by mouth 2 (two) times daily.  Dispense: 180 tablet; Refill: 4  3. Acquired hypothyroidism Check thyroid panel and adjust levothyroxine as indicated.  - levothyroxine (SYNTHROID, LEVOTHROID) 50 MCG tablet; Take 1 tablet (50 mcg total) by mouth daily before breakfast.  Dispense: 90 tablet; Refill: 4  4. Mixed hyperlipidemia Continue crestor as prescribed. Check fasting lipid panel and adjust as indicated.  - rosuvastatin (CRESTOR) 20 MG tablet; Take 1 tablet (20 mg total) by mouth daily.  Dispense: 90 tablet; Refill: 4  General Counseling: Emiliano verbalizes understanding of the findings of todays visit and agrees with plan of treatment. I have discussed any further diagnostic evaluation that may be needed or ordered today. We also reviewed his medications today. he has been encouraged to call the office with any questions or concerns that should arise related to todays visit.  Diabetes Counseling:  1. Addition of ACE inh/ ARB'S for nephroprotection. 2. Diabetic foot care, prevention of complications.  3.Exercise and lose weight.  4. Diabetic eye examination, 5. Monitor blood sugar closlely. nutrition counseling.  6.Sign and symptoms of hypoglycemia including shaking sweating,confusion and headaches.   This patient was seen by Leretha Pol, FNP- C in Collaboration with Dr Lavera Guise as a part of collaborative care agreement    Orders Placed This Encounter  Procedures  . POCT HgB A1C    Meds ordered this encounter  Medications  . DISCONTD: glucose blood (ACCU-CHEK GUIDE) test strip    Sig: Blood sugar testing daily    Dispense:  50 each    Refill:  12    Order Specific Question:   Supervising Provider    Answer:   Lavera Guise [place]  . DISCONTD: Lancets Misc. (ACCU-CHEK FASTCLIX LANCET) KIT    Sig: 1 each by Does not  apply route daily.    Dispense:  1 kit    Refill:  5    Order Specific Question:   Supervising Provider    Answer:   Lavera Guise [[id number]]  . glipiZIDE (GLUCOTROL) 5 MG tablet    Sig: Take 1 tablet (5 mg total) by mouth 2 (two) times daily.    Dispense:  180 tablet    Refill:  4    Update to BID dosing.    Order Specific Question:   Supervising Provider    Answer:   Lavera Guise [[id number]]  . levothyroxine (SYNTHROID, LEVOTHROID) 50 MCG tablet    Sig: Take 1 tablet (50 mcg total) by mouth  daily before breakfast.    Dispense:  90 tablet    Refill:  4    Order Specific Question:   Supervising Provider    Answer:   Lavera Guise [place]  . rosuvastatin (CRESTOR) 20 MG tablet    Sig: Take 1 tablet (20 mg total) by mouth daily.    Dispense:  90 tablet    Refill:  4    Order Specific Question:   Supervising Provider    Answer:   Lavera Guise [[id number]]    Time spent: 78 Minutes          Dr Lavera Guise Internal medicine

## 2017-10-10 ENCOUNTER — Other Ambulatory Visit
Admission: RE | Admit: 2017-10-10 | Discharge: 2017-10-10 | Disposition: A | Discharge status: Home / Self Care | Payer: Medicare Other | Source: Ambulatory Visit | Attending: Nurse Practitioner | Admitting: Nurse Practitioner

## 2017-10-10 DIAGNOSIS — E785 Hyperlipidemia, unspecified: Secondary | ICD-10-CM | POA: Insufficient documentation

## 2017-10-10 DIAGNOSIS — Z125 Encounter for screening for malignant neoplasm of prostate: Secondary | ICD-10-CM | POA: Diagnosis not present

## 2017-10-10 DIAGNOSIS — E039 Hypothyroidism, unspecified: Secondary | ICD-10-CM | POA: Diagnosis not present

## 2017-10-10 DIAGNOSIS — E119 Type 2 diabetes mellitus without complications: Secondary | ICD-10-CM | POA: Insufficient documentation

## 2017-10-10 LAB — COMPREHENSIVE METABOLIC PANEL
ALT: 38 U/L (ref 17–63)
AST: 42 U/L — AB (ref 15–41)
Albumin: 4.3 g/dL (ref 3.5–5.0)
Alkaline Phosphatase: 114 U/L (ref 38–126)
Anion gap: 6 (ref 5–15)
BILIRUBIN TOTAL: 0.6 mg/dL (ref 0.3–1.2)
BUN: 7 mg/dL (ref 6–20)
CO2: 28 mmol/L (ref 22–32)
Calcium: 9.2 mg/dL (ref 8.9–10.3)
Chloride: 102 mmol/L (ref 101–111)
Creatinine, Ser: 0.88 mg/dL (ref 0.61–1.24)
GFR calc Af Amer: >60 mL/min (ref >60)
GFR calc non Af Amer: >60 mL/min (ref >60)
GLUCOSE: 119 mg/dL — AB (ref 65–99)
POTASSIUM: 4.6 mmol/L (ref 3.5–5.1)
SODIUM: 136 mmol/L (ref 135–145)
TOTAL PROTEIN: 7.6 g/dL (ref 6.5–8.1)

## 2017-10-10 LAB — LIPID PANEL
CHOL/HDL RATIO: 2.7 ratio
Cholesterol: 106 mg/dL (ref 0–200)
HDL: 40 mg/dL — ABNORMAL LOW (ref >40)
LDL Cholesterol: 53 mg/dL (ref 0–99)
Triglycerides: 67 mg/dL (ref <150)
VLDL: 13 mg/dL (ref 0–40)

## 2017-10-10 LAB — CBC
HEMATOCRIT: 43.4 % (ref 40.0–52.0)
HEMOGLOBIN: 13.8 g/dL (ref 13.0–18.0)
MCH: 25.4 pg — ABNORMAL LOW (ref 26.0–34.0)
MCHC: 31.8 g/dL — ABNORMAL LOW (ref 32.0–36.0)
MCV: 79.7 fL — ABNORMAL LOW (ref 80.0–100.0)
Platelets: 212 10*3/uL (ref 150–440)
RBC: 5.45 MIL/uL (ref 4.40–5.90)
RDW: 15.6 % — AB (ref 11.5–14.5)
WBC: 5.5 10*3/uL (ref 3.8–10.6)

## 2017-10-10 LAB — TSH: TSH: 5.603 u[IU]/mL — AB (ref 0.350–4.500)

## 2017-10-10 LAB — T4, FREE: Free T4: 0.91 ng/dL (ref 0.82–1.77)

## 2017-10-10 LAB — PSA: PROSTATIC SPECIFIC ANTIGEN: 0.35 ng/mL (ref 0.00–4.00)

## 2017-10-10 LAB — VITAMIN B12: Vitamin B-12: 183 pg/mL (ref 180–914)

## 2017-10-19 ENCOUNTER — Other Ambulatory Visit: Payer: Self-pay

## 2017-10-19 DIAGNOSIS — E1165 Type 2 diabetes mellitus with hyperglycemia: Secondary | ICD-10-CM

## 2017-10-19 MED ORDER — ACCU-CHEK FASTCLIX LANCET KIT: 1.0000 | PACK | Freq: Every day | 1 refills | Status: DC

## 2017-10-19 MED ORDER — GLUCOSE BLOOD VI STRP: ORAL_STRIP | 1 refills | Status: DC

## 2017-10-24 DIAGNOSIS — E039 Hypothyroidism, unspecified: Secondary | ICD-10-CM | POA: Insufficient documentation

## 2017-10-24 DIAGNOSIS — E782 Mixed hyperlipidemia: Secondary | ICD-10-CM | POA: Insufficient documentation

## 2017-10-24 DIAGNOSIS — E1165 Type 2 diabetes mellitus with hyperglycemia: Secondary | ICD-10-CM | POA: Insufficient documentation

## 2017-11-20 ENCOUNTER — Ambulatory Visit (INDEPENDENT_AMBULATORY_CARE_PROVIDER_SITE_OTHER): Payer: Medicare Other | Admitting: Cardiovascular Disease

## 2017-11-20 ENCOUNTER — Encounter: Payer: Self-pay | Admitting: Cardiovascular Disease

## 2017-11-20 VITALS — BP 120/70 | HR 56 | Ht 65.0 in | Wt 177.5 lb

## 2017-11-20 DIAGNOSIS — E782 Mixed hyperlipidemia: Secondary | ICD-10-CM

## 2017-11-20 DIAGNOSIS — I2510 Atherosclerotic heart disease of native coronary artery without angina pectoris: Secondary | ICD-10-CM

## 2017-11-20 MED ORDER — ROSUVASTATIN CALCIUM 10 MG PO TABS: 10.0000 mg | ORAL_TABLET | Freq: Every day | ORAL | 1 refills | Status: DC

## 2017-11-20 NOTE — Progress Notes (Signed)
Cardiology Office Note   Date:  11/20/2017   ID:  Travis Higgins, Travis Higgins 05-Dec-1950, MRN [id number]  PCP:  Lavera Guise, MD  Cardiologist:   Kathlyn Sacramento, MD   Chief Complaint  Patient presents with  . other    1 year follow up. Meds reviewed by the pt. verbally. "doing well." Pt. c/o leg pain when taking the crestor.       History of Present Illness: Travis Higgins is a 67 y.o. male who is here today for follow-up visit regarding abnormal stress test.  His wife reports that he possibly had cardiac catheterization in 2014 or 2015 in [place] which was unremarkable.  We do not have these records.  He has prolonged history of type 2 diabetes and hypothyroidism.  He is a previous smoker with no family history of coronary artery disease.   He is a retired Radio producer.   He was seen last year for exertional dyspnea and fatigue.  He underwent a treadmill nuclear stress test which was intermediate risk study with evidence of prior anterior/anteroseptal infarct as well as apical inferior defect.  The distribution was consistent with a distal LAD as well as OM branch territory.  EF was 49%. Echocardiogram showed an EF of 60 to 65% with no significant valvular abnormalities.  Cardiac catheterization was recommended but was declined by the patient. The patient went on a trip to [place] for 3 months and reports no issues with chest pain or shortness of breath.  He complains of bilateral leg aching at night and not with walking.  He thinks it is related to rosuvastatin.   Past Medical History:  Diagnosis Date  . Anemia    in past  . Diabetes mellitus without complication ([place])   . Thyroid disease     Past Surgical History:  Procedure Laterality Date  . COLONOSCOPY    . COLONOSCOPY WITH PROPOFOL N/A 09/20/2015   Procedure: COLONOSCOPY WITH PROPOFOL;  Surgeon: Lucilla Lame, MD;  Location: [place];  Service: Endoscopy;  Laterality: N/A;  . POLYPECTOMY  09/20/2015   Procedure: POLYPECTOMY INTESTINAL;  Surgeon: Lucilla Lame, MD;  Location: [place];  Service: Endoscopy;;  Sigmoid colon polyp x 1 Rectal polyp x 2     Current Outpatient Medications  Medication Sig Dispense Refill  . acetaminophen (TYLENOL) 500 MG tablet Take 500 mg by mouth daily as needed for moderate pain or headache.    Marland Kitchen aspirin EC 81 MG tablet Take 1 tablet (81 mg total) by mouth daily. 90 tablet 3  . glipiZIDE (GLUCOTROL) 5 MG tablet Take 1 tablet (5 mg total) by mouth 2 (two) times daily. 180 tablet 4  . glucose blood (ACCU-CHEK GUIDE) test strip Blood sugar testing daily 150 each 1  . Lancets Misc. (ACCU-CHEK FASTCLIX LANCET) KIT 1 each by Does not apply route daily. 3 kit 1  . levothyroxine (SYNTHROID, LEVOTHROID) 50 MCG tablet Take 1 tablet (50 mcg total) by mouth daily before breakfast. 90 tablet 4  . rosuvastatin (CRESTOR) 20 MG tablet Take 1 tablet (20 mg total) by mouth daily. 90 tablet 4   No current facility-administered medications for this visit.     Allergies:   Levothyroxine and Lisinopril    Social History:  The patient  reports that he has quit smoking. He has never used smokeless tobacco. He reports that he does not drink alcohol or use drugs.   Family History:  The patient's family history is negative for coronary artery disease.  ROS:  Please see the history of present illness.   Otherwise, review of systems are positive for none.   All other systems are reviewed and negative.    PHYSICAL EXAM: VS:  BP 120/70 (BP Location: Left Arm, Patient Position: Sitting, Cuff Size: Normal)   Pulse (!) 56   Ht '5\' 5"'$  (1.651 m)   Wt 177 lb 8 oz (80.5 kg)   BMI 29.54 kg/m  , BMI Body mass index is 29.54 kg/m. GEN: Well nourished, well developed, in no acute distress  HEENT: normal  Neck: no JVD, carotid bruits, or masses Cardiac: RRR; no murmurs, rubs, or gallops,no edema  Respiratory:  clear to auscultation bilaterally, normal work of breathing GI:  soft, nontender, nondistended, + BS MS: no deformity or atrophy  Skin: warm and dry, no rash Neuro:  Strength and sensation are intact Psych: euthymic mood, full affect   EKG:  EKG is ordered today. The ekg ordered today demonstrates sinus bradycardia with right bundle branch block, evidence of prior inferior and anteroseptal infarcts.  Recent Labs: 10/10/2017: ALT 38; BUN 7; Creatinine, Ser 0.88; Hemoglobin 13.8; Platelets 212; Potassium 4.6; Sodium 136; TSH 5.603    Lipid Panel    Component Value Date/Time   CHOL 106 10/10/2017 0840   TRIG 67 10/10/2017 0840   HDL 40 (L) 10/10/2017 0840   CHOLHDL 2.7 10/10/2017 0840   VLDL 13 10/10/2017 0840   LDLCALC 53 10/10/2017 0840      Wt Readings from Last 3 Encounters:  11/20/17 177 lb 8 oz (80.5 kg)  10/04/17 176 lb (79.8 kg)  06/14/17 175 lb 6.4 oz (79.6 kg)       PAD Screen 05/24/2017  Previous PAD dx? No  Previous surgical procedure? No  Pain with walking? No  Feet/toe relief with dangling? No  Painful, non-healing ulcers? No  Extremities discolored? No      ASSESSMENT AND PLAN:  1.  Coronary artery disease involving native coronary arteries without angina: The patient is suspected to having coronary artery disease based on abnormal nuclear stress test and EKG.   I suggested proceeding with cardiac catheterization but he reports that there was no significant disease on prior cardiac catheterization.  We are going to request these records from [place].  He wants to continue medical therapy for now.  He is not having anginal symptoms.  Continue low-dose aspirin.  2.  Hyperlipidemia: LDL improved significantly with rosuvastatin down to 53.  He reports myalgia affecting both legs.  His distal pulses are normal they are and there is no evidence of peripheral arterial disease.  It is possible that rosuvastatin might be responsible for some of his symptoms and thus I decreased the dose to 10 mg daily.    Disposition:    FU with me in 6 months.   Signed,  Kathlyn Sacramento, MD  11/20/2017 3:05 PM    [facility]

## 2017-11-20 NOTE — Patient Instructions (Signed)
Medication Instructions: DECREASE the Crestor to 10 mg daily  If you need a refill on your cardiac medications before your next appointment, please call your pharmacy.   Follow-Up: Your physician wants you to follow-up in 6 months with Dr. Fletcher Anon. You will receive a reminder letter in the mail two months in advance. If you don't receive a letter, please call our office at [phone] to schedule this follow-up appointment.   Thank you for choosing Heartcare at [facility]!

## 2018-01-22 ENCOUNTER — Ambulatory Visit (INDEPENDENT_AMBULATORY_CARE_PROVIDER_SITE_OTHER): Payer: Medicare Other | Admitting: Nurse Practitioner

## 2018-01-22 ENCOUNTER — Encounter: Payer: Self-pay | Admitting: Nurse Practitioner

## 2018-01-22 VITALS — BP 149/85 | HR 69 | Resp 16 | Ht 65.0 in | Wt 176.0 lb

## 2018-01-22 DIAGNOSIS — E039 Hypothyroidism, unspecified: Secondary | ICD-10-CM

## 2018-01-22 DIAGNOSIS — I10 Essential (primary) hypertension: Secondary | ICD-10-CM

## 2018-01-22 DIAGNOSIS — E1165 Type 2 diabetes mellitus with hyperglycemia: Secondary | ICD-10-CM

## 2018-01-22 DIAGNOSIS — E538 Deficiency of other specified B group vitamins: Secondary | ICD-10-CM

## 2018-01-22 DIAGNOSIS — Z0001 Encounter for general adult medical examination with abnormal findings: Secondary | ICD-10-CM

## 2018-01-22 MED ORDER — CYANOCOBALAMIN 1000 MCG/ML IJ SOLN
1000.0000 ug | Freq: Once | INTRAMUSCULAR | Status: AC
  Administered 2018-01-22: 1000 ug via INTRAMUSCULAR

## 2018-01-22 NOTE — Progress Notes (Signed)
[facility] [place], [place] [address]  Internal MEDICINE  Office Visit Note  Patient Name: Travis Higgins  [id number]  [id number]  Date of Service: 02/06/2018   Pt is here for routine health maintenance examination  Chief Complaint  Patient presents with  . Diabetes  . Medicare Wellness     Diabetes  He presents for his follow-up diabetic visit. He has type 2 diabetes mellitus. His disease course has been fluctuating. There are no hypoglycemic associated symptoms. Pertinent negatives for hypoglycemia include no dizziness, headaches, nervousness/anxiousness or tremors. Associated symptoms include fatigue. Pertinent negatives for diabetes include no chest pain, no polydipsia, no polyphagia and no polyuria. There are no hypoglycemic complications. Symptoms are stable. There are no diabetic complications. Risk factors for coronary artery disease include dyslipidemia, hypertension and male sex. Current diabetic treatment includes oral agent (monotherapy). He is compliant with treatment most of the time. He is following a generally healthy (has been having sugary snacks at night. ) diet. Meal planning includes avoidance of concentrated sweets. He has not had a previous visit with a dietitian. He participates in exercise intermittently. There is no change in his home blood glucose trend. An ACE inhibitor/angiotensin II receptor blocker is being taken. He does not see a podiatrist.Eye exam is current.     Current Medication: Outpatient Encounter Medications as of 01/22/2018  Medication Sig  . acetaminophen (TYLENOL) 500 MG tablet Take 500 mg by mouth daily as needed for moderate pain or headache.  Marland Kitchen aspirin EC 81 MG tablet Take 1 tablet (81 mg total) by mouth daily.  Marland Kitchen glipiZIDE (GLUCOTROL) 5 MG tablet Take 1 tablet (5 mg total) by mouth 2 (two) times daily.  Marland Kitchen glucose blood (ACCU-CHEK GUIDE) test strip Blood sugar testing daily  . Lancets Misc. (ACCU-CHEK FASTCLIX  LANCET) KIT 1 each by Does not apply route daily.  Marland Kitchen levothyroxine (SYNTHROID, LEVOTHROID) 50 MCG tablet Take 1 tablet (50 mcg total) by mouth daily before breakfast.  . rosuvastatin (CRESTOR) 10 MG tablet Take 1 tablet (10 mg total) by mouth daily.  . [EXPIRED] cyanocobalamin ((VITAMIN B-12)) injection 1,000 mcg    No facility-administered encounter medications on file as of 01/22/2018.     Surgical History: Past Surgical History:  Procedure Laterality Date  . COLONOSCOPY    . COLONOSCOPY WITH PROPOFOL N/A 09/20/2015   Procedure: COLONOSCOPY WITH PROPOFOL;  Surgeon: Lucilla Lame, MD;  Location: [facility];  Service: Endoscopy;  Laterality: N/A;  . POLYPECTOMY  09/20/2015   Procedure: POLYPECTOMY INTESTINAL;  Surgeon: Lucilla Lame, MD;  Location: [facility];  Service: Endoscopy;;  Sigmoid colon polyp x 1 Rectal polyp x 2    Medical History: Past Medical History:  Diagnosis Date  . Anemia    in past  . Diabetes mellitus without complication ([place])   . Thyroid disease     Family History: History reviewed. No pertinent family history.    Review of Systems  Constitutional: Positive for fatigue. Negative for activity change, chills and unexpected weight change.  HENT: Negative for congestion, postnasal drip, rhinorrhea, sneezing and sore throat.   Eyes: Negative.  Negative for redness.  Respiratory: Negative for cough, chest tightness, shortness of breath and wheezing.   Cardiovascular: Negative for chest pain and palpitations.       Elevated bp  Gastrointestinal: Negative for abdominal pain, constipation, diarrhea, nausea and vomiting.  Endocrine: Negative for cold intolerance, heat intolerance, polydipsia, polyphagia and polyuria.       Blood sugars  doing well  Also states that he is taking thyroid medication every day.  Genitourinary: Negative.  Negative for dysuria and frequency.  Musculoskeletal: Negative for arthralgias, back pain, joint swelling and neck  pain.  Skin: Negative for rash.  Allergic/Immunologic: Negative for environmental allergies.  Neurological: Negative for dizziness, tremors, numbness and headaches.  Hematological: Negative for adenopathy. Does not bruise/bleed easily.  Psychiatric/Behavioral: Negative for behavioral problems (Depression), dysphoric mood, sleep disturbance and suicidal ideas. The patient is not nervous/anxious.      Today's Vitals   01/22/18 1540  BP: (!) 149/85  Pulse: 69  Resp: 16  SpO2: 96%  Weight: 176 lb (79.8 kg)  Height: '5\' 5"'$  (1.651 m)    Physical Exam  Constitutional: He is oriented to person, place, and time. He appears well-developed and well-nourished. No distress.  HENT:  Head: Normocephalic and atraumatic.  Nose: Nose normal.  Mouth/Throat: Oropharynx is clear and moist. No oropharyngeal exudate.  Eyes: Pupils are equal, round, and reactive to light. Conjunctivae and EOM are normal.  Neck: Normal range of motion. Neck supple. Normal carotid pulses and no JVD present. Carotid bruit is not present. No tracheal deviation present. No thyromegaly present.  Cardiovascular: Normal rate, regular rhythm, normal heart sounds and intact distal pulses. Exam reveals no gallop and no friction rub.  No murmur heard. Pulses:      Dorsalis pedis pulses are 1+ on the right side, and 1+ on the left side.       Posterior tibial pulses are 1+ on the right side, and 1+ on the left side.  Pulmonary/Chest: Effort normal and breath sounds normal. No respiratory distress. He has no wheezes. He has no rales. He exhibits no tenderness.  Abdominal: Soft. Bowel sounds are normal. There is no tenderness.  Musculoskeletal: Normal range of motion.       Right foot: There is normal range of motion and no deformity.       Left foot: There is normal range of motion and no deformity.  Feet:  Right Foot:  Protective Sensation: 10 sites sensed.  Left Foot:  Protective Sensation: 10 sites tested. 10 sites sensed.   Lymphadenopathy:    He has no cervical adenopathy.  Neurological: He is alert and oriented to person, place, and time. No cranial nerve deficit.  Skin: Skin is warm and dry. Capillary refill takes 2 to 3 seconds. He is not diaphoretic.  Psychiatric: He has a normal mood and affect. His behavior is normal. Judgment and thought content normal.  Nursing note and vitals reviewed.    LABS: Recent Results (from the past 2160 hour(s))  Microalbumin, urine     Status: None   Collection Time: 01/22/18  3:40 PM  Result Value Ref Range   Microalbumin, Urine 12.9 Not Estab. ug/mL  UA/M w/rflx Culture, Routine     Status: Abnormal   Collection Time: 01/22/18  3:40 PM  Result Value Ref Range   Specific Gravity, UA 1.024 1.005 - 1.030   pH, UA 5.0 5.0 - 7.5   Color, UA Yellow Yellow   Appearance Ur Clear Clear   Leukocytes, UA Negative Negative   Protein, UA Negative Negative/Trace   Glucose, UA 3+ (A) Negative   Ketones, UA Negative Negative   RBC, UA Negative Negative   Bilirubin, UA Negative Negative   Urobilinogen, Ur 0.2 0.2 - 1.0 mg/dL   Nitrite, UA Negative Negative   Microscopic Examination Comment     Comment: Microscopic follows if indicated.   Microscopic  Examination See below:     Comment: Microscopic was indicated and was performed.   Urinalysis Reflex Comment     Comment: This specimen will not reflex to a Urine Culture.  Microscopic Examination     Status: None   Collection Time: 01/22/18  3:40 PM  Result Value Ref Range   WBC, UA 0-5 0 - 5 /hpf   RBC, UA 0-2 0 - 2 /hpf   Epithelial Cells (non renal) None seen 0 - 10 /hpf   Casts None seen None seen /lpf   Mucus, UA Present Not Estab.   Bacteria, UA None seen None seen/Few  POCT HgB A1C     Status: Abnormal   Collection Time: 01/31/18 12:58 PM  Result Value Ref Range   Hemoglobin A1C 8.0 (A) 4.0 - 5.6 %   HbA1c POC (<> result, manual entry)     HbA1c, POC (prediabetic range)     HbA1c, POC (controlled diabetic  range)      Assessment/Plan:  1. Encounter for general adult medical examination with abnormal findings Annual health maintenance exam today. - UA/M w/rflx Culture, Routine  2. Uncontrolled type 2 diabetes mellitus with hyperglycemia (HCC) - POCT HgB A1C 8.0 today. Continue diabetic medication as prescribed. Emphasized focus on improving diet, limiting intake of carbohydrates and sugar and participating in low-impact exercise as tolerated.  - Microalbumin, urine  3. B12 deficiency b12 injection administered today.  - cyanocobalamin ((VITAMIN B-12)) injection 1,000 mcg  4. Essential hypertension Stable. Continue bp medication as prescribed   5. Acquired hypothyroidism Continue levothyroxine as prescribed   General Counseling: Travis Higgins verbalizes understanding of the findings of todays visit and agrees with plan of treatment. I have discussed any further diagnostic evaluation that may be needed or ordered today. We also reviewed his medications today. he has been encouraged to call the office with any questions or concerns that should arise related to todays visit.    Counseling:  Diabetes Counseling:  1. Addition of ACE inh/ ARB'S for nephroprotection. Microalbumin is updated  2. Diabetic foot care, prevention of complications. Podiatry consult 3. Exercise and lose weight.  4. Diabetic eye examination, Diabetic eye exam is updated  5. Monitor blood sugar closlely. nutrition counseling.  6. Sign and symptoms of hypoglycemia including shaking sweating,confusion and headaches.   This patient was seen by Leretha Pol FNP Collaboration with Dr Lavera Guise as a part of collaborative care agreement  Orders Placed This Encounter  Procedures  . Microscopic Examination  . Microalbumin, urine  . UA/M w/rflx Culture, Routine  . POCT HgB A1C    Meds ordered this encounter  Medications  . cyanocobalamin ((VITAMIN B-12)) injection 1,000 mcg    Time spent: [place], MD  Internal Medicine

## 2018-01-23 LAB — UA/M W/RFLX CULTURE, ROUTINE
BILIRUBIN UA: NEGATIVE
Ketones, UA: NEGATIVE
LEUKOCYTES UA: NEGATIVE
NITRITE UA: NEGATIVE
PROTEIN UA: NEGATIVE
RBC UA: NEGATIVE
SPEC GRAV UA: 1.024 (ref 1.005–1.030)
UUROB: 0.2 mg/dL (ref 0.2–1.0)
pH, UA: 5 (ref 5.0–7.5)

## 2018-01-23 LAB — MICROSCOPIC EXAMINATION
Bacteria, UA: NONE SEEN
Casts: NONE SEEN /lpf
Epithelial Cells (non renal): NONE SEEN /hpf (ref 0–10)

## 2018-01-23 LAB — MICROALBUMIN, URINE: MICROALBUM., U, RANDOM: 12.9 ug/mL

## 2018-01-31 LAB — POCT GLYCOSYLATED HEMOGLOBIN (HGB A1C): HEMOGLOBIN A1C: 8 % — AB (ref 4.0–5.6)

## 2018-02-20 ENCOUNTER — Ambulatory Visit (INDEPENDENT_AMBULATORY_CARE_PROVIDER_SITE_OTHER): Payer: Medicare Other

## 2018-02-20 DIAGNOSIS — E538 Deficiency of other specified B group vitamins: Secondary | ICD-10-CM

## 2018-02-20 MED ORDER — CYANOCOBALAMIN 1000 MCG/ML IJ SOLN
1000.0000 ug | Freq: Once | INTRAMUSCULAR | Status: AC
  Administered 2018-02-20: 1000 ug via INTRAMUSCULAR

## 2018-02-20 NOTE — Progress Notes (Signed)
12  

## 2018-03-06 ENCOUNTER — Ambulatory Visit (INDEPENDENT_AMBULATORY_CARE_PROVIDER_SITE_OTHER): Payer: Medicare Other | Admitting: Adult Health

## 2018-03-06 ENCOUNTER — Encounter: Payer: Self-pay | Admitting: Adult Health

## 2018-03-06 VITALS — BP 132/82 | HR 67 | Temp 98.2°F | Resp 16 | Ht 65.0 in | Wt 175.0 lb

## 2018-03-06 DIAGNOSIS — J029 Acute pharyngitis, unspecified: Secondary | ICD-10-CM

## 2018-03-06 DIAGNOSIS — J321 Chronic frontal sinusitis: Secondary | ICD-10-CM | POA: Diagnosis not present

## 2018-03-06 DIAGNOSIS — R05 Cough: Secondary | ICD-10-CM

## 2018-03-06 DIAGNOSIS — R059 Cough, unspecified: Secondary | ICD-10-CM

## 2018-03-06 LAB — POCT RAPID STREP A (OFFICE): RAPID STREP A SCREEN: NEGATIVE

## 2018-03-06 MED ORDER — AMOXICILLIN-POT CLAVULANATE 875-125 MG PO TABS: 1.0000 | ORAL_TABLET | Freq: Two times a day (BID) | ORAL | 0 refills | Status: DC

## 2018-03-06 NOTE — Patient Instructions (Signed)

## 2018-03-06 NOTE — Progress Notes (Signed)
[facility] [place], [place] [address]  Internal MEDICINE  Office Visit Note  Patient Name: Travis Higgins  [id number]  [id number]  Date of Service: 03/09/2018  Chief Complaint  Patient presents with  . Sore Throat    Started over the weekend, denies fever   . Cough    nightime cough     HPI Pt is here for a sick visit. Pt reports for about a week he has had a sore throat, running nose, and a cough at night.  He reports post nasal drip.  He has been using nyquil and benadryl to help him sleep and to dry up his drainage. He denies fever, muscle aches.     Current Medication:  Outpatient Encounter Medications as of 03/06/2018  Medication Sig  . acetaminophen (TYLENOL) 500 MG tablet Take 500 mg by mouth daily as needed for moderate pain or headache.  Marland Kitchen aspirin EC 81 MG tablet Take 1 tablet (81 mg total) by mouth daily.  Marland Kitchen glipiZIDE (GLUCOTROL) 5 MG tablet Take 1 tablet (5 mg total) by mouth 2 (two) times daily.  Marland Kitchen glucose blood (ACCU-CHEK GUIDE) test strip Blood sugar testing daily  . Lancets Misc. (ACCU-CHEK FASTCLIX LANCET) KIT 1 each by Does not apply route daily.  Marland Kitchen levothyroxine (SYNTHROID, LEVOTHROID) 50 MCG tablet Take 1 tablet (50 mcg total) by mouth daily before breakfast.  . rosuvastatin (CRESTOR) 10 MG tablet Take 1 tablet (10 mg total) by mouth daily.  Marland Kitchen amoxicillin-clavulanate (AUGMENTIN) 875-125 MG tablet Take 1 tablet by mouth 2 (two) times daily.   No facility-administered encounter medications on file as of 03/06/2018.       Medical History: Past Medical History:  Diagnosis Date  . Anemia    in past  . Diabetes mellitus without complication ([place])   . Thyroid disease      Vital Signs: BP 132/82   Pulse 67   Temp 98.2 F (36.8 C)   Resp 16   Ht '5\' 5"'$  (1.651 m)   Wt 175 lb (79.4 kg)   SpO2 94%   BMI 29.12 kg/m    Review of Systems  Constitutional: Negative.  Negative for chills, fatigue and unexpected weight change.   HENT: Positive for postnasal drip, rhinorrhea and sore throat. Negative for congestion and sneezing.   Eyes: Negative for redness.  Respiratory: Negative.  Negative for cough, chest tightness and shortness of breath.   Cardiovascular: Negative.  Negative for chest pain and palpitations.  Gastrointestinal: Negative.  Negative for abdominal pain, constipation, diarrhea, nausea and vomiting.  Endocrine: Negative.   Genitourinary: Negative.  Negative for dysuria and frequency.  Musculoskeletal: Negative.  Negative for arthralgias, back pain, joint swelling and neck pain.  Skin: Negative.  Negative for rash.  Allergic/Immunologic: Negative.   Neurological: Negative.  Negative for tremors and numbness.  Hematological: Negative for adenopathy. Does not bruise/bleed easily.  Psychiatric/Behavioral: Negative.  Negative for behavioral problems, sleep disturbance and suicidal ideas. The patient is not nervous/anxious.     Physical Exam  Constitutional: He is oriented to person, place, and time. He appears well-developed and well-nourished. No distress.  HENT:  Head: Normocephalic and atraumatic.  Mouth/Throat: Oropharynx is clear and moist. No oropharyngeal exudate.  Eyes: Pupils are equal, round, and reactive to light. EOM are normal.  Neck: Normal range of motion. Neck supple. No JVD present. No tracheal deviation present. No thyromegaly present.  Cardiovascular: Normal rate, regular rhythm and normal heart sounds. Exam reveals no gallop and no  friction rub.  No murmur heard. Pulmonary/Chest: Effort normal and breath sounds normal. No respiratory distress. He has no wheezes. He has no rales. He exhibits no tenderness.  Abdominal: Soft. There is no tenderness. There is no guarding.  Musculoskeletal: Normal range of motion.  Lymphadenopathy:    He has no cervical adenopathy.  Neurological: He is alert and oriented to person, place, and time. No cranial nerve deficit.  Skin: Skin is warm and dry.  He is not diaphoretic.  Psychiatric: He has a normal mood and affect. His behavior is normal. Judgment and thought content normal.  Nursing note and vitals reviewed.   Assessment/Plan: 1. Sinusitis chronic, frontal Take antibiotic as directed. Encouraged symptoms management with dayquil and nyquil.  -RX for Augmentin 875. 14 Pills, no refills.   2. Sore throat Negative for Strep. - POCT rapid strep A  3. Cough - Add Flonase otc Most likley from Post nasal drip.  General Counseling: [place] verbalizes understanding of the findings of todays visit and agrees with plan of treatment. I have discussed any further diagnostic evaluation that may be needed or ordered today. We also reviewed his medications today. he has been encouraged to call the office with any questions or concerns that should arise related to todays visit.   Orders Placed This Encounter  Procedures  . POCT rapid strep A    Meds ordered this encounter  Medications  . amoxicillin-clavulanate (AUGMENTIN) 875-125 MG tablet    Sig: Take 1 tablet by mouth 2 (two) times daily.    Dispense:  14 tablet    Refill:  0    Time spent: 25 Minutes  This patient was seen by Orson Gear AGNP-C in Collaboration with Dr Lavera Guise as a part of collaborative care agreement

## 2018-03-25 ENCOUNTER — Encounter: Payer: Self-pay | Admitting: Cardiovascular Disease

## 2018-03-27 ENCOUNTER — Ambulatory Visit (INDEPENDENT_AMBULATORY_CARE_PROVIDER_SITE_OTHER): Payer: Medicare Other

## 2018-03-27 DIAGNOSIS — E538 Deficiency of other specified B group vitamins: Secondary | ICD-10-CM | POA: Diagnosis not present

## 2018-04-26 ENCOUNTER — Ambulatory Visit (INDEPENDENT_AMBULATORY_CARE_PROVIDER_SITE_OTHER): Payer: Medicare Other | Admitting: Nurse Practitioner

## 2018-04-26 ENCOUNTER — Other Ambulatory Visit: Payer: Self-pay | Admitting: Nurse Practitioner

## 2018-04-26 ENCOUNTER — Encounter: Payer: Self-pay | Admitting: Nurse Practitioner

## 2018-04-26 VITALS — BP 148/84 | HR 65 | Resp 16 | Ht 65.0 in | Wt 177.0 lb

## 2018-04-26 DIAGNOSIS — E538 Deficiency of other specified B group vitamins: Secondary | ICD-10-CM | POA: Diagnosis not present

## 2018-04-26 DIAGNOSIS — E1165 Type 2 diabetes mellitus with hyperglycemia: Secondary | ICD-10-CM | POA: Diagnosis not present

## 2018-04-26 DIAGNOSIS — E039 Hypothyroidism, unspecified: Secondary | ICD-10-CM

## 2018-04-26 DIAGNOSIS — E782 Mixed hyperlipidemia: Secondary | ICD-10-CM | POA: Diagnosis not present

## 2018-04-26 LAB — POCT GLYCOSYLATED HEMOGLOBIN (HGB A1C): Hemoglobin A1C: 7.7 % — AB (ref 4.0–5.6)

## 2018-04-26 MED ORDER — PNEUMOCOCCAL VAC POLYVALENT 25 MCG/0.5ML IJ INJ
0.5000 mL | INJECTION | INTRAMUSCULAR | 0 refills | Status: AC
Start: 2018-04-26 — End: 2018-04-26

## 2018-04-26 MED ORDER — CYANOCOBALAMIN 1000 MCG/ML IJ SOLN
1000.0000 ug | Freq: Once | INTRAMUSCULAR | Status: AC
  Administered 2018-04-26: 1000 ug via INTRAMUSCULAR

## 2018-04-26 NOTE — Progress Notes (Signed)
[facility] [place], [place] [address]  Internal MEDICINE  Office Visit Note  Patient Name: Travis Higgins  [id number]  [id number]  Date of Service: 04/28/2018  Chief Complaint  Patient presents with  . Diabetes  . Hypothyroidism    Diabetes  He presents for his follow-up diabetic visit. He has type 2 diabetes mellitus. No MedicAlert identification noted. His disease course has been improving. There are no hypoglycemic associated symptoms. Pertinent negatives for hypoglycemia include no dizziness, headaches, nervousness/anxiousness or tremors. There are no diabetic associated symptoms. Pertinent negatives for diabetes include no chest pain, no polydipsia, no polyphagia and no polyuria. There are no hypoglycemic complications. Symptoms are stable. Risk factors for coronary artery disease include diabetes mellitus, dyslipidemia and male sex. Current diabetic treatment includes oral agent (monotherapy). He is compliant with treatment most of the time. His weight is stable. He is following a generally healthy diet. Meal planning includes avoidance of concentrated sweets. He has not had a previous visit with a dietitian. He participates in exercise intermittently. There is no change in his home blood glucose trend. An ACE inhibitor/angiotensin II receptor blocker is not being taken.       Current Medication: Outpatient Encounter Medications as of 04/26/2018  Medication Sig  . acetaminophen (TYLENOL) 500 MG tablet Take 500 mg by mouth daily as needed for moderate pain or headache.  Marland Kitchen aspirin EC 81 MG tablet Take 1 tablet (81 mg total) by mouth daily.  Marland Kitchen glipiZIDE (GLUCOTROL) 5 MG tablet Take 1 tablet (5 mg total) by mouth 2 (two) times daily.  Marland Kitchen glucose blood (ACCU-CHEK GUIDE) test strip Blood sugar testing daily  . Lancets Misc. (ACCU-CHEK FASTCLIX LANCET) KIT 1 each by Does not apply route daily.  Marland Kitchen levothyroxine (SYNTHROID, LEVOTHROID) 50 MCG tablet Take 1  tablet (50 mcg total) by mouth daily before breakfast.  . rosuvastatin (CRESTOR) 10 MG tablet Take 1 tablet (10 mg total) by mouth daily.  . [DISCONTINUED] amoxicillin-clavulanate (AUGMENTIN) 875-125 MG tablet Take 1 tablet by mouth 2 (two) times daily. (Patient not taking: Reported on 04/26/2018)  . [EXPIRED] cyanocobalamin ((VITAMIN B-12)) injection 1,000 mcg    No facility-administered encounter medications on file as of 04/26/2018.     Surgical History: Past Surgical History:  Procedure Laterality Date  . COLONOSCOPY    . COLONOSCOPY WITH PROPOFOL N/A 09/20/2015   Procedure: COLONOSCOPY WITH PROPOFOL;  Surgeon: Lucilla Lame, MD;  Location: [facility];  Service: Endoscopy;  Laterality: N/A;  . POLYPECTOMY  09/20/2015   Procedure: POLYPECTOMY INTESTINAL;  Surgeon: Lucilla Lame, MD;  Location: [facility];  Service: Endoscopy;;  Sigmoid colon polyp x 1 Rectal polyp x 2    Medical History: Past Medical History:  Diagnosis Date  . Anemia    in past  . CAD (coronary artery disease)    Cardiac catheterization done in July 2015 in [place] by Dr. Jori Moll fields showed mild nonobstructive disease in the mid LAD and distal left circumflex with normal ejection fraction.  . Diabetes mellitus without complication ([place])   . Thyroid disease     Family History: History reviewed. No pertinent family history.  Social History   Socioeconomic History  . Marital status: Married    Spouse name: Not on file  . Number of children: Not on file  . Years of education: Not on file  . Highest education level: Not on file  Occupational History  . Not on file  Social Needs  . Financial resource strain:  Not on file  . Food insecurity:    Worry: Not on file    Inability: Not on file  . Transportation needs:    Medical: Not on file    Non-medical: Not on file  Tobacco Use  . Smoking status: Former Research scientist (life sciences)  . Smokeless tobacco: Never Used  . Tobacco comment: quit approx 2013   Substance and Sexual Activity  . Alcohol use: No  . Drug use: No  . Sexual activity: Not on file  Lifestyle  . Physical activity:    Days per week: Not on file    Minutes per session: Not on file  . Stress: Not on file  Relationships  . Social connections:    Talks on phone: Not on file    Gets together: Not on file    Attends religious service: Not on file    Active member of club or organization: Not on file    Attends meetings of clubs or organizations: Not on file    Relationship status: Not on file  . Intimate partner violence:    Fear of current or ex partner: Not on file    Emotionally abused: Not on file    Physically abused: Not on file    Forced sexual activity: Not on file  Other Topics Concern  . Not on file  Social History Narrative  . Not on file      Review of Systems  Constitutional: Negative for activity change, chills and unexpected weight change.  HENT: Negative for congestion, postnasal drip, rhinorrhea, sneezing and sore throat.   Eyes: Negative.  Negative for redness.  Respiratory: Negative for cough, chest tightness, shortness of breath and wheezing.   Cardiovascular: Negative for chest pain and palpitations.  Gastrointestinal: Negative for abdominal pain, constipation, diarrhea, nausea and vomiting.  Endocrine: Negative for cold intolerance, heat intolerance, polydipsia, polyphagia and polyuria.       Blood sugars doing well  Also states that he is taking thyroid medication every day.  Musculoskeletal: Negative for arthralgias, back pain, joint swelling and neck pain.  Skin: Negative for rash.  Neurological: Negative for dizziness, tremors, numbness and headaches.  Hematological: Negative for adenopathy. Does not bruise/bleed easily.  Psychiatric/Behavioral: Negative for behavioral problems (Depression), dysphoric mood, sleep disturbance and suicidal ideas. The patient is not nervous/anxious.     Today's Vitals   04/26/18 1554  BP: (!) 148/84   Pulse: 65  Resp: 16  SpO2: 98%  Weight: 177 lb (80.3 kg)  Height: _0  (1.651 m)    Physical Exam  Constitutional: He is oriented to person, place, and time. He appears well-developed and well-nourished. No distress.  HENT:  Head: Normocephalic and atraumatic.  Nose: Nose normal.  Mouth/Throat: No oropharyngeal exudate.  Eyes: Pupils are equal, round, and reactive to light. EOM are normal.  Neck: Normal range of motion. Neck supple. No JVD present. No tracheal deviation present. No thyromegaly present.  Cardiovascular: Normal rate, regular rhythm and normal heart sounds. Exam reveals no gallop and no friction rub.  No murmur heard. Pulmonary/Chest: Effort normal and breath sounds normal. No respiratory distress. He has no wheezes. He has no rales. He exhibits no tenderness.  Musculoskeletal: Normal range of motion.  Lymphadenopathy:    He has no cervical adenopathy.  Neurological: He is alert and oriented to person, place, and time. No cranial nerve deficit.  Skin: He is not diaphoretic.  Psychiatric: He has a normal mood and affect. His behavior is normal. Judgment and thought content  normal.  Nursing note and vitals reviewed.  Assessment/Plan:  1. Uncontrolled type 2 diabetes mellitus with hyperglycemia (HCC) - POCT HgB A1C improved 7.7 today. Continue diabetic medication as prescribed.   2. Acquired hypothyroidism Continue levothyroxine every day  3. Mixed hyperlipidemia Continue rosuvastatin as prescribed   4. B12 deficiency b12 injection administered today. - cyanocobalamin ((VITAMIN B-12)) injection 1,000 mcg  General Counseling: Tevan verbalizes understanding of the findings of todays visit and agrees with plan of treatment. I have discussed any further diagnostic evaluation that may be needed or ordered today. We also reviewed his medications today. he has been encouraged to call the office with any questions or concerns that should arise related to todays  visit.  Diabetes Counseling:  1. Addition of ACE inh/ ARB'S for nephroprotection. Microalbumin is updated  2. Diabetic foot care, prevention of complications. Podiatry consult 3. Exercise and lose weight.  4. Diabetic eye examination, Diabetic eye exam is updated  5. Monitor blood sugar closlely. nutrition counseling.  6. Sign and symptoms of hypoglycemia including shaking sweating,confusion and headaches.  This patient was seen by Leretha Pol FNP Collaboration with Dr Lavera Guise as a part of collaborative care agreement  Orders Placed This Encounter  Procedures  . POCT HgB A1C    Meds ordered this encounter  Medications  . cyanocobalamin ((VITAMIN B-12)) injection 1,000 mcg    Time spent: 25 Minutes      Dr Lavera Guise Internal medicine

## 2018-04-27 DIAGNOSIS — Z23 Encounter for immunization: Secondary | ICD-10-CM | POA: Diagnosis not present

## 2018-05-15 MED ORDER — CYANOCOBALAMIN 1000 MCG/ML IJ SOLN
1000.0000 ug | Freq: Once | INTRAMUSCULAR | Status: AC
  Administered 2018-03-27: 1000 ug via INTRAMUSCULAR

## 2018-05-15 NOTE — Progress Notes (Signed)
B12 SHOT GIVEN

## 2018-05-27 ENCOUNTER — Ambulatory Visit (INDEPENDENT_AMBULATORY_CARE_PROVIDER_SITE_OTHER): Payer: Medicare Other

## 2018-05-27 DIAGNOSIS — E538 Deficiency of other specified B group vitamins: Secondary | ICD-10-CM

## 2018-05-27 MED ORDER — CYANOCOBALAMIN 1000 MCG/ML IJ SOLN
1000.0000 ug | Freq: Once | INTRAMUSCULAR | Status: AC
  Administered 2018-05-27: 1000 ug via INTRAMUSCULAR

## 2018-06-11 ENCOUNTER — Ambulatory Visit: Payer: Self-pay | Admitting: Adult Health

## 2018-06-13 ENCOUNTER — Ambulatory Visit: Payer: Medicare Other | Admitting: Adult Health

## 2018-06-13 ENCOUNTER — Ambulatory Visit
Admission: RE | Admit: 2018-06-13 | Discharge: 2018-06-13 | Disposition: A | Discharge status: Home / Self Care | Payer: Medicare Other | Source: Ambulatory Visit | Attending: Adult Health | Admitting: Adult Health

## 2018-06-13 ENCOUNTER — Encounter: Payer: Self-pay | Admitting: Adult Health

## 2018-06-13 VITALS — BP 138/74 | HR 62 | Temp 97.2°F | Resp 16 | Ht 65.0 in | Wt 173.6 lb

## 2018-06-13 DIAGNOSIS — M47816 Spondylosis without myelopathy or radiculopathy, lumbar region: Secondary | ICD-10-CM | POA: Diagnosis not present

## 2018-06-13 DIAGNOSIS — G8929 Other chronic pain: Secondary | ICD-10-CM | POA: Insufficient documentation

## 2018-06-13 DIAGNOSIS — J0110 Acute frontal sinusitis, unspecified: Secondary | ICD-10-CM | POA: Diagnosis not present

## 2018-06-13 DIAGNOSIS — M47814 Spondylosis without myelopathy or radiculopathy, thoracic region: Secondary | ICD-10-CM | POA: Diagnosis not present

## 2018-06-13 DIAGNOSIS — R6889 Other general symptoms and signs: Secondary | ICD-10-CM

## 2018-06-13 DIAGNOSIS — M549 Dorsalgia, unspecified: Secondary | ICD-10-CM

## 2018-06-13 DIAGNOSIS — M48061 Spinal stenosis, lumbar region without neurogenic claudication: Secondary | ICD-10-CM | POA: Diagnosis not present

## 2018-06-13 LAB — POCT INFLUENZA A/B
Influenza A, POC: NEGATIVE
Influenza B, POC: NEGATIVE

## 2018-06-13 LAB — POCT URINALYSIS DIPSTICK
BILIRUBIN UA: NEGATIVE
Glucose, UA: POSITIVE — AB
Ketones, UA: NEGATIVE
LEUKOCYTES UA: NEGATIVE
Nitrite, UA: NEGATIVE
PH UA: 5 (ref 5.0–8.0)
Protein, UA: POSITIVE — AB
RBC UA: NEGATIVE
SPEC GRAV UA: 1.01 (ref 1.010–1.025)
UROBILINOGEN UA: 0.2 U/dL

## 2018-06-13 MED ORDER — AMOXICILLIN-POT CLAVULANATE 875-125 MG PO TABS
1.0000 | ORAL_TABLET | Freq: Two times a day (BID) | ORAL | 0 refills | Status: DC
Start: 2018-06-13 — End: 2018-07-26

## 2018-06-13 NOTE — Progress Notes (Signed)
[facility] [place], [place] [address]  Internal MEDICINE  Office Visit Note  Patient Name: Travis Higgins  [id number]  [id number]  Date of Service: 06/13/2018  Chief Complaint  Patient presents with  . Cough    congestion  . Sore Throat    had a fever yesterday  . Back Pain     HPI Pt is here for a sick visit. Pt reports he drove to [place] (10 hours) last week.  He had some low back pain during the drive.  He reports this resolved.  While he was there he played golf and has been coughing, and having sore throat and sinus congestion.     Current Medication:  Outpatient Encounter Medications as of 06/13/2018  Medication Sig  . acetaminophen (TYLENOL) 500 MG tablet Take 500 mg by mouth daily as needed for moderate pain or headache.  Marland Kitchen aspirin EC 81 MG tablet Take 1 tablet (81 mg total) by mouth daily.  Marland Kitchen glipiZIDE (GLUCOTROL) 5 MG tablet Take 1 tablet (5 mg total) by mouth 2 (two) times daily.  Marland Kitchen glucose blood (ACCU-CHEK GUIDE) test strip Blood sugar testing daily  . Lancets Misc. (ACCU-CHEK FASTCLIX LANCET) KIT 1 each by Does not apply route daily.  Marland Kitchen levothyroxine (SYNTHROID, LEVOTHROID) 50 MCG tablet Take 1 tablet (50 mcg total) by mouth daily before breakfast.  . rosuvastatin (CRESTOR) 10 MG tablet Take 1 tablet (10 mg total) by mouth daily.   No facility-administered encounter medications on file as of 06/13/2018.       Medical History: Past Medical History:  Diagnosis Date  . Anemia    in past  . CAD (coronary artery disease)    Cardiac catheterization done in July 2015 in [place] by Dr. Jori Moll Higgins showed mild nonobstructive disease in the mid LAD and distal left circumflex with normal ejection fraction.  . Diabetes mellitus without complication ([place])   . Thyroid disease      Vital Signs: BP 138/74   Pulse 62   Temp (!) 97.2 F (36.2 C)   Resp 16   Ht '5\' 5"'$  (1.651 m)   Wt 173 lb 9.6 oz (78.7 kg)   SpO2 97%   BMI 28.89  kg/m    Review of Systems  Constitutional: Negative.  Negative for chills, fatigue and unexpected weight change.  HENT: Positive for postnasal drip, sinus pressure, sinus pain and sore throat. Negative for congestion, rhinorrhea and sneezing.   Eyes: Negative for redness.  Respiratory: Positive for cough. Negative for chest tightness and shortness of breath.   Cardiovascular: Negative.  Negative for chest pain and palpitations.  Gastrointestinal: Negative.  Negative for abdominal pain, constipation, diarrhea, nausea and vomiting.  Endocrine: Negative.   Genitourinary: Negative.  Negative for dysuria and frequency.  Musculoskeletal: Negative.  Negative for arthralgias, back pain, joint swelling and neck pain.  Skin: Negative.  Negative for rash.  Allergic/Immunologic: Negative.   Neurological: Negative.  Negative for tremors and numbness.  Hematological: Negative for adenopathy. Does not bruise/bleed easily.  Psychiatric/Behavioral: Negative.  Negative for behavioral problems, sleep disturbance and suicidal ideas. The patient is not nervous/anxious.     Physical Exam Vitals signs and nursing note reviewed.  Constitutional:      General: He is not in acute distress.    Appearance: He is well-developed. He is not diaphoretic.  HENT:     Head: Normocephalic and atraumatic.     Mouth/Throat:     Pharynx: No oropharyngeal exudate.  Eyes:  Pupils: Pupils are equal, round, and reactive to light.  Neck:     Musculoskeletal: Normal range of motion and neck supple.     Thyroid: No thyromegaly.     Vascular: No JVD.     Trachea: No tracheal deviation.  Cardiovascular:     Rate and Rhythm: Normal rate and regular rhythm.     Heart sounds: Normal heart sounds. No murmur. No friction rub. No gallop.   Pulmonary:     Effort: Pulmonary effort is normal. No respiratory distress.     Breath sounds: Normal breath sounds. No wheezing or rales.  Chest:     Chest wall: No tenderness.   Abdominal:     Palpations: Abdomen is soft.     Tenderness: There is no abdominal tenderness. There is no guarding.  Musculoskeletal: Normal range of motion.  Lymphadenopathy:     Cervical: No cervical adenopathy.  Skin:    General: Skin is warm and dry.  Neurological:     Mental Status: He is alert and oriented to person, place, and time.     Cranial Nerves: No cranial nerve deficit.  Psychiatric:        Behavior: Behavior normal.        Thought Content: Thought content normal.        Judgment: Judgment normal.    Assessment/Plan: 1. Acute non-recurrent frontal sinusitis Patient provided with course of Augmentin for sinus infection.  Instructed patient take all medications as prescribed.  He is also instructed to return to clinic in 7 to 10 days if symptoms fail to improve. - amoxicillin-clavulanate (AUGMENTIN) 875-125 MG tablet; Take 1 tablet by mouth 2 (two) times daily.  Dispense: 14 tablet; Refill: 0  2. Back pain, unspecified back location, unspecified back pain laterality, unspecified chronicity Patient urine dip normal except for glucose. - POCT Urinalysis Dipstick  3. Flu-like symptoms Negative at this time. - POCT Influenza A/B  4. Chronic back pain greater than 3 months duration Patient requesting x-rays of cervical thoracic and lumbar spine.  Ordered at this time patient will have been done at his convenience. - DG Lumbar Spine Complete; Future - DG Thoracic Spine W/Swimmers; Future - DG Cervical Spine Complete; Future  General Counseling: Travis Higgins verbalizes understanding of the findings of todays visit and agrees with plan of treatment. I have discussed any further diagnostic evaluation that may be needed or ordered today. We also reviewed his medications today. he has been encouraged to call the office with any questions or concerns that should arise related to todays visit.   Orders Placed This Encounter  Procedures  . POCT Influenza A/B  . POCT Urinalysis  Dipstick    No orders of the defined types were placed in this encounter.   Time spent: 25 Minutes  This patient was seen by Orson Gear AGNP-C in Collaboration with Dr Lavera Guise as a part of collaborative care agreement.  Kendell Bane AGNP-C Internal Medicine

## 2018-06-13 NOTE — Patient Instructions (Signed)

## 2018-06-27 ENCOUNTER — Ambulatory Visit (INDEPENDENT_AMBULATORY_CARE_PROVIDER_SITE_OTHER): Payer: Medicare Other

## 2018-06-27 DIAGNOSIS — E538 Deficiency of other specified B group vitamins: Secondary | ICD-10-CM | POA: Diagnosis not present

## 2018-06-27 MED ORDER — CYANOCOBALAMIN 1000 MCG/ML IJ SOLN
1000.0000 ug | Freq: Once | INTRAMUSCULAR | Status: AC
  Administered 2018-06-27: 1000 ug via INTRAMUSCULAR

## 2018-07-26 ENCOUNTER — Encounter: Payer: Self-pay | Admitting: Nurse Practitioner

## 2018-07-26 ENCOUNTER — Ambulatory Visit (INDEPENDENT_AMBULATORY_CARE_PROVIDER_SITE_OTHER): Payer: Medicare Other | Admitting: Nurse Practitioner

## 2018-07-26 VITALS — BP 129/75 | HR 57 | Resp 16 | Ht 65.0 in | Wt 174.8 lb

## 2018-07-26 DIAGNOSIS — M549 Dorsalgia, unspecified: Secondary | ICD-10-CM

## 2018-07-26 DIAGNOSIS — E039 Hypothyroidism, unspecified: Secondary | ICD-10-CM | POA: Diagnosis not present

## 2018-07-26 DIAGNOSIS — E1165 Type 2 diabetes mellitus with hyperglycemia: Secondary | ICD-10-CM

## 2018-07-26 DIAGNOSIS — E538 Deficiency of other specified B group vitamins: Secondary | ICD-10-CM

## 2018-07-26 LAB — POCT GLYCOSYLATED HEMOGLOBIN (HGB A1C): Hemoglobin A1C: 8 % — AB (ref 4.0–5.6)

## 2018-07-26 MED ORDER — MELOXICAM 7.5 MG PO TABS
7.5000 mg | ORAL_TABLET | Freq: Every day | ORAL | 0 refills | Status: DC
Start: 2018-07-26 — End: 2019-03-28

## 2018-07-26 MED ORDER — CYANOCOBALAMIN 1000 MCG/ML IJ SOLN
1000.0000 ug | Freq: Once | INTRAMUSCULAR | Status: AC
  Administered 2018-07-26: 1000 ug via INTRAMUSCULAR

## 2018-07-26 NOTE — Progress Notes (Signed)
[facility] [place], [place] [address]  Internal MEDICINE  Office Visit Note  Patient Name: Travis Higgins  [id number]  [id number]  Date of Service: 08/01/2018  Chief Complaint  Patient presents with  . Medical Management of Chronic Issues    3 month follow up  . Diabetes    A1C  . Injections    B12    Diabetes  He presents for his follow-up diabetic visit. He has type 2 diabetes mellitus. No MedicAlert identification noted. His disease course has been improving. There are no hypoglycemic associated symptoms. Pertinent negatives for hypoglycemia include no dizziness, headaches, nervousness/anxiousness or tremors. There are no diabetic associated symptoms. Pertinent negatives for diabetes include no chest pain, no polydipsia and no polyuria. There are no hypoglycemic complications. Symptoms are stable. Risk factors for coronary artery disease include diabetes mellitus, dyslipidemia and male sex. Current diabetic treatment includes oral agent (monotherapy). He is compliant with treatment most of the time. His weight is stable. He is following a generally healthy diet. Meal planning includes avoidance of concentrated sweets. He has not had a previous visit with a dietitian. He participates in exercise intermittently. There is no change in his home blood glucose trend. An ACE inhibitor/angiotensin II receptor blocker is not being taken.       Current Medication: Outpatient Encounter Medications as of 07/26/2018  Medication Sig  . acetaminophen (TYLENOL) 500 MG tablet Take 500 mg by mouth daily as needed for moderate pain or headache.  Marland Kitchen aspirin EC 81 MG tablet Take 1 tablet (81 mg total) by mouth daily.  Marland Kitchen glipiZIDE (GLUCOTROL) 5 MG tablet Take 1 tablet (5 mg total) by mouth 2 (two) times daily.  Marland Kitchen glucose blood (ACCU-CHEK GUIDE) test strip Blood sugar testing daily  . Lancets Misc. (ACCU-CHEK FASTCLIX LANCET) KIT 1 each by Does not apply route daily.  Marland Kitchen  levothyroxine (SYNTHROID, LEVOTHROID) 50 MCG tablet Take 1 tablet (50 mcg total) by mouth daily before breakfast.  . rosuvastatin (CRESTOR) 10 MG tablet Take 1 tablet (10 mg total) by mouth daily.  . meloxicam (MOBIC) 7.5 MG tablet Take 1 tablet (7.5 mg total) by mouth daily.  . [DISCONTINUED] amoxicillin-clavulanate (AUGMENTIN) 875-125 MG tablet Take 1 tablet by mouth 2 (two) times daily. (Patient not taking: Reported on 07/26/2018)  . [EXPIRED] cyanocobalamin ((VITAMIN B-12)) injection 1,000 mcg   . [EXPIRED] cyanocobalamin ((VITAMIN B-12)) injection 1,000 mcg    No facility-administered encounter medications on file as of 07/26/2018.     Surgical History: Past Surgical History:  Procedure Laterality Date  . COLONOSCOPY    . COLONOSCOPY WITH PROPOFOL N/A 09/20/2015   Procedure: COLONOSCOPY WITH PROPOFOL;  Surgeon: Lucilla Lame, MD;  Location: [facility];  Service: Endoscopy;  Laterality: N/A;  . POLYPECTOMY  09/20/2015   Procedure: POLYPECTOMY INTESTINAL;  Surgeon: Lucilla Lame, MD;  Location: [facility];  Service: Endoscopy;;  Sigmoid colon polyp x 1 Rectal polyp x 2    Medical History: Past Medical History:  Diagnosis Date  . Anemia    in past  . CAD (coronary artery disease)    Cardiac catheterization done in July 2015 in [place] by Dr. Jori Moll fields showed mild nonobstructive disease in the mid LAD and distal left circumflex with normal ejection fraction.  . Diabetes mellitus without complication ([place])   . Thyroid disease     Family History: History reviewed. No pertinent family history.  Social History   Socioeconomic History  . Marital status: Married  Spouse name: Not on file  . Number of children: Not on file  . Years of education: Not on file  . Highest education level: Not on file  Occupational History  . Not on file  Social Needs  . Financial resource strain: Not on file  . Food insecurity:    Worry: Not on file    Inability: Not on  file  . Transportation needs:    Medical: Not on file    Non-medical: Not on file  Tobacco Use  . Smoking status: Former Research scientist (life sciences)  . Smokeless tobacco: Never Used  . Tobacco comment: quit approx 2013  Substance and Sexual Activity  . Alcohol use: No  . Drug use: No  . Sexual activity: Not on file  Lifestyle  . Physical activity:    Days per week: Not on file    Minutes per session: Not on file  . Stress: Not on file  Relationships  . Social connections:    Talks on phone: Not on file    Gets together: Not on file    Attends religious service: Not on file    Active member of club or organization: Not on file    Attends meetings of clubs or organizations: Not on file    Relationship status: Not on file  . Intimate partner violence:    Fear of current or ex partner: Not on file    Emotionally abused: Not on file    Physically abused: Not on file    Forced sexual activity: Not on file  Other Topics Concern  . Not on file  Social History Narrative  . Not on file      Review of Systems  Constitutional: Negative for activity change, chills and unexpected weight change.  HENT: Negative for congestion, postnasal drip, rhinorrhea, sneezing and sore throat.   Respiratory: Negative for cough, chest tightness, shortness of breath and wheezing.   Cardiovascular: Negative for chest pain and palpitations.  Gastrointestinal: Negative for abdominal pain, constipation, diarrhea, nausea and vomiting.  Endocrine: Negative for cold intolerance, heat intolerance, polydipsia and polyuria.       Blood sugars doing well  Also states that he is taking thyroid medication every day.  Musculoskeletal: Negative for arthralgias, back pain, joint swelling and neck pain.  Skin: Negative for rash.  Neurological: Negative for dizziness, tremors, numbness and headaches.  Hematological: Negative for adenopathy. Does not bruise/bleed easily.  Psychiatric/Behavioral: Negative for behavioral problems  (Depression), dysphoric mood, sleep disturbance and suicidal ideas. The patient is not nervous/anxious.     Today's Vitals   07/26/18 1611  BP: 129/75  Pulse: (!) 57  Resp: 16  SpO2: 96%  Weight: 174 lb 12.8 oz (79.3 kg)  Height: '5\' 5"'$  (1.651 m)   Body mass index is 29.09 kg/m.  Physical Exam Vitals signs and nursing note reviewed.  Constitutional:      General: He is not in acute distress.    Appearance: Normal appearance. He is well-developed. He is not diaphoretic.  HENT:     Head: Normocephalic and atraumatic.     Right Ear: Ear canal normal.     Left Ear: Ear canal normal.     Nose: Nose normal.     Mouth/Throat:     Pharynx: No oropharyngeal exudate.  Eyes:     Conjunctiva/sclera: Conjunctivae normal.     Pupils: Pupils are equal, round, and reactive to light.  Neck:     Musculoskeletal: Normal range of motion and neck supple.  Thyroid: No thyromegaly.     Vascular: No carotid bruit or JVD.     Trachea: No tracheal deviation.  Cardiovascular:     Rate and Rhythm: Normal rate and regular rhythm.     Heart sounds: Normal heart sounds. No murmur. No friction rub. No gallop.   Pulmonary:     Effort: Pulmonary effort is normal. No respiratory distress.     Breath sounds: Normal breath sounds. No wheezing or rales.  Chest:     Chest wall: No tenderness.  Musculoskeletal: Normal range of motion.  Lymphadenopathy:     Cervical: No cervical adenopathy.  Neurological:     Mental Status: He is alert and oriented to person, place, and time.     Cranial Nerves: No cranial nerve deficit.  Psychiatric:        Behavior: Behavior normal.        Thought Content: Thought content normal.        Judgment: Judgment normal.    Assessment/Plan: 1. Uncontrolled type 2 diabetes mellitus with hyperglycemia (HCC) - POCT HgB A1C 8.0 today. Discussed poor dietary choices over the past three months contributing to elevated blood sugars. Patient would like to make positive changes  to diet and lifestyle. Will adjust medications if HgbA1c still elevated at next visit.   2. B12 deficiency Continue monthly b12 injections.  - cyanocobalamin ((VITAMIN B-12)) injection 1,000 mcg  3. Back pain, unspecified back location, unspecified back pain laterality, unspecified chronicity May take meloxicam 7.'5mg'$  daily if needed to relieve pain and inflammation.  - meloxicam (MOBIC) 7.5 MG tablet; Take 1 tablet (7.5 mg total) by mouth daily.  Dispense: 30 tablet; Refill: 0  4. Acquired hypothyroidism Stable. Continue levothytoxine as prescribed   General Counseling: Evangelos verbalizes understanding of the findings of todays visit and agrees with plan of treatment. I have discussed any further diagnostic evaluation that may be needed or ordered today. We also reviewed his medications today. he has been encouraged to call the office with any questions or concerns that should arise related to todays visit.  Diabetes Counseling:  1. Addition of ACE inh/ ARB'S for nephroprotection. Microalbumin is updated  2. Diabetic foot care, prevention of complications. Podiatry consult 3. Exercise and lose weight.  4. Diabetic eye examination, Diabetic eye exam is updated  5. Monitor blood sugar closlely. nutrition counseling.  6. Sign and symptoms of hypoglycemia including shaking sweating,confusion and headaches.  This patient was seen by Leretha Pol FNP Collaboration with Dr Lavera Guise as a part of collaborative care agreement  Orders Placed This Encounter  Procedures  . POCT HgB A1C    Meds ordered this encounter  Medications  . cyanocobalamin ((VITAMIN B-12)) injection 1,000 mcg  . meloxicam (MOBIC) 7.5 MG tablet    Sig: Take 1 tablet (7.5 mg total) by mouth daily.    Dispense:  30 tablet    Refill:  0    Order Specific Question:   Supervising Provider    Answer:   Lavera Guise [[id number]]    Time spent: 68 Minutes      Dr Lavera Guise Internal medicine

## 2018-08-01 DIAGNOSIS — M549 Dorsalgia, unspecified: Secondary | ICD-10-CM | POA: Insufficient documentation

## 2018-08-13 ENCOUNTER — Other Ambulatory Visit: Payer: Self-pay

## 2018-08-13 NOTE — Telephone Encounter (Signed)
Faxed  Diabetic foot exam paper work to [facility] with note

## 2018-08-15 ENCOUNTER — Encounter: Payer: Self-pay | Admitting: Nurse Practitioner

## 2018-08-15 ENCOUNTER — Ambulatory Visit (INDEPENDENT_AMBULATORY_CARE_PROVIDER_SITE_OTHER): Payer: Medicare Other | Admitting: Nurse Practitioner

## 2018-08-15 ENCOUNTER — Other Ambulatory Visit: Payer: Self-pay

## 2018-08-15 VITALS — BP 146/80 | HR 77 | Resp 16 | Ht 65.0 in | Wt 173.8 lb

## 2018-08-15 DIAGNOSIS — E1142 Type 2 diabetes mellitus with diabetic polyneuropathy: Secondary | ICD-10-CM

## 2018-08-15 NOTE — Progress Notes (Addendum)
[facility] [place], [place] [address]  Internal MEDICINE  Office Visit Note  Patient Name: Travis Higgins  [id number]  [id number]  Date of Service: 08/28/2018  Chief Complaint  Patient presents with  . Medical Management of Chronic Issues    pt is needing diabetic shoes    The patient is here to be assessed for diabetic shoes. He has well controlled type 2 diabetes. He does have peripheral neuropathy which affects his feet. This is most severe at night. He reports this pain and burning hot pain and worse with even light touch.       Current Medication: Outpatient Encounter Medications as of 08/15/2018  Medication Sig  . acetaminophen (TYLENOL) 500 MG tablet Take 500 mg by mouth daily as needed for moderate pain or headache.  Marland Kitchen aspirin EC 81 MG tablet Take 1 tablet (81 mg total) by mouth daily.  Marland Kitchen glipiZIDE (GLUCOTROL) 5 MG tablet Take 1 tablet (5 mg total) by mouth 2 (two) times daily.  Marland Kitchen glucose blood (ACCU-CHEK GUIDE) test strip Blood sugar testing daily  . Lancets Misc. (ACCU-CHEK FASTCLIX LANCET) KIT 1 each by Does not apply route daily.  Marland Kitchen levothyroxine (SYNTHROID, LEVOTHROID) 50 MCG tablet Take 1 tablet (50 mcg total) by mouth daily before breakfast.  . meloxicam (MOBIC) 7.5 MG tablet Take 1 tablet (7.5 mg total) by mouth daily.  . rosuvastatin (CRESTOR) 10 MG tablet Take 1 tablet (10 mg total) by mouth daily.   No facility-administered encounter medications on file as of 08/15/2018.     Surgical History: Past Surgical History:  Procedure Laterality Date  . COLONOSCOPY    . COLONOSCOPY WITH PROPOFOL N/A 09/20/2015   Procedure: COLONOSCOPY WITH PROPOFOL;  Surgeon: Lucilla Lame, MD;  Location: [place];  Service: Endoscopy;  Laterality: N/A;  . POLYPECTOMY  09/20/2015   Procedure: POLYPECTOMY INTESTINAL;  Surgeon: Lucilla Lame, MD;  Location: [place];  Service: Endoscopy;;  Sigmoid colon polyp x 1 Rectal polyp x 2     Medical History: Past Medical History:  Diagnosis Date  . Anemia    in past  . CAD (coronary artery disease)    Cardiac catheterization done in July 2015 in [place] by Dr. Jori Moll fields showed mild nonobstructive disease in the mid LAD and distal left circumflex with normal ejection fraction.  . Diabetes mellitus without complication ([place])   . Thyroid disease     Family History: History reviewed. No pertinent family history.  Social History   Socioeconomic History  . Marital status: Married    Spouse name: Not on file  . Number of children: Not on file  . Years of education: Not on file  . Highest education level: Not on file  Occupational History  . Not on file  Social Needs  . Financial resource strain: Not on file  . Food insecurity:    Worry: Not on file    Inability: Not on file  . Transportation needs:    Medical: Not on file    Non-medical: Not on file  Tobacco Use  . Smoking status: Former Research scientist (life sciences)  . Smokeless tobacco: Never Used  . Tobacco comment: quit approx 2013  Substance and Sexual Activity  . Alcohol use: No  . Drug use: No  . Sexual activity: Not on file  Lifestyle  . Physical activity:    Days per week: Not on file    Minutes per session: Not on file  . Stress: Not on file  Relationships  .  Social connections:    Talks on phone: Not on file    Gets together: Not on file    Attends religious service: Not on file    Active member of club or organization: Not on file    Attends meetings of clubs or organizations: Not on file    Relationship status: Not on file  . Intimate partner violence:    Fear of current or ex partner: Not on file    Emotionally abused: Not on file    Physically abused: Not on file    Forced sexual activity: Not on file  Other Topics Concern  . Not on file  Social History Narrative  . Not on file      Review of Systems  Constitutional: Negative for activity change, chills and unexpected weight change.   HENT: Negative for congestion, postnasal drip, rhinorrhea, sneezing and sore throat.   Respiratory: Negative for cough, chest tightness, shortness of breath and wheezing.   Cardiovascular: Negative for chest pain and palpitations.  Gastrointestinal: Negative for abdominal pain, constipation, diarrhea, nausea and vomiting.  Endocrine: Negative for cold intolerance, heat intolerance, polydipsia and polyuria.       Blood sugars doing well  Also states that he is taking thyroid medication every day.  Musculoskeletal: Negative for arthralgias, back pain, joint swelling and neck pain.  Skin: Negative for rash.  Neurological: Negative for dizziness, tremors, numbness and headaches.  Hematological: Negative for adenopathy. Does not bruise/bleed easily.  Psychiatric/Behavioral: Negative for behavioral problems (Depression), dysphoric mood, sleep disturbance and suicidal ideas. The patient is not nervous/anxious.     Vital Signs: BP (!) 146/80 (BP Location: Left Arm, Patient Position: Sitting, Cuff Size: Large)   Pulse 77   Resp 16   Ht _0  (1.651 m)   Wt 173 lb 12.8 oz (78.8 kg)   SpO2 96%   BMI 28.92 kg/m    Physical Exam Vitals signs and nursing note reviewed.  Constitutional:      General: He is not in acute distress.    Appearance: Normal appearance. He is well-developed. He is not diaphoretic.  HENT:     Head: Normocephalic and atraumatic.     Nose: Nose normal.     Mouth/Throat:     Pharynx: No oropharyngeal exudate.  Eyes:     Pupils: Pupils are equal, round, and reactive to light.  Neck:     Musculoskeletal: Normal range of motion and neck supple.     Thyroid: No thyromegaly.     Vascular: No JVD.     Trachea: No tracheal deviation.  Cardiovascular:     Rate and Rhythm: Normal rate and regular rhythm.     Pulses:          Dorsalis pedis pulses are 2+ on the right side and 2+ on the left side.       Posterior tibial pulses are 2+ on the right side and 2+ on the left  side.     Heart sounds: Normal heart sounds. No murmur. No friction rub. No gallop.   Pulmonary:     Effort: Pulmonary effort is normal. No respiratory distress.     Breath sounds: Normal breath sounds. No wheezing or rales.  Chest:     Chest wall: No tenderness.  Musculoskeletal: Normal range of motion.        General: Deformity present.     Right foot: Normal range of motion. Bunion present.     Left foot: Normal range of motion. Bunion  present.       Feet:     Comments: Bunions present on the proximal metacarpal joints of bilateral great toes.   Feet:     Right foot:     Protective Sensation: 10 sites tested. 10 sites sensed.     Skin integrity: Skin integrity normal.     Left foot:     Protective Sensation: 10 sites tested. 10 sites sensed.     Skin integrity: Skin integrity normal.     Comments: Small bunions located at proximal joint of the great toes of both feet. Non tender. Does have mild pain with light palpation of dorsal surface of both feet.  Lymphadenopathy:     Cervical: No cervical adenopathy.  Neurological:     Mental Status: He is alert and oriented to person, place, and time.     Cranial Nerves: No cranial nerve deficit.  Psychiatric:        Behavior: Behavior normal.        Thought Content: Thought content normal.        Judgment: Judgment normal.   Assessment/Plan: 1. Type 2 diabetes, controlled, with peripheral neuropathy ([place]) Diabetic foot exam performed today. Order for diabetic shoes to be sent to DME supplier. Continue medication as prescribed   General Counseling: Travis Higgins verbalizes understanding of the findings of todays visit and agrees with plan of treatment. I have discussed any further diagnostic evaluation that may be needed or ordered today. We also reviewed his medications today. he has been encouraged to call the office with any questions or concerns that should arise related to todays visit.  Diabetes Counseling:  1. Addition of ACE inh/  ARB'S for nephroprotection. Microalbumin is updated  2. Diabetic foot care, prevention of complications. Podiatry consult 3. Exercise and lose weight.  4. Diabetic eye examination, Diabetic eye exam is updated  5. Monitor blood sugar closlely. nutrition counseling.  6. Sign and symptoms of hypoglycemia including shaking sweating,confusion and headaches.   This patient was seen by Leretha Pol FNP Collaboration with Dr Lavera Guise as a part of collaborative care agreement   Time spent: 25 Minutes      Dr Lavera Guise Internal medicine

## 2018-08-23 ENCOUNTER — Ambulatory Visit (INDEPENDENT_AMBULATORY_CARE_PROVIDER_SITE_OTHER): Payer: Medicare Other

## 2018-08-23 DIAGNOSIS — E538 Deficiency of other specified B group vitamins: Secondary | ICD-10-CM | POA: Diagnosis not present

## 2018-08-23 MED ORDER — CYANOCOBALAMIN 1000 MCG/ML IJ SOLN
1000.0000 ug | Freq: Once | INTRAMUSCULAR | Status: AC
  Administered 2018-08-23: 1000 ug via INTRAMUSCULAR

## 2018-08-28 DIAGNOSIS — E1142 Type 2 diabetes mellitus with diabetic polyneuropathy: Secondary | ICD-10-CM | POA: Insufficient documentation

## 2018-09-27 ENCOUNTER — Ambulatory Visit: Payer: Self-pay

## 2018-10-25 ENCOUNTER — Encounter: Payer: Self-pay | Admitting: Nurse Practitioner

## 2018-10-25 ENCOUNTER — Ambulatory Visit (INDEPENDENT_AMBULATORY_CARE_PROVIDER_SITE_OTHER): Payer: Medicare Other | Admitting: Nurse Practitioner

## 2018-10-25 ENCOUNTER — Other Ambulatory Visit: Payer: Self-pay

## 2018-10-25 VITALS — BP 140/80 | HR 60 | Resp 16 | Ht 65.0 in | Wt 177.0 lb

## 2018-10-25 DIAGNOSIS — E782 Mixed hyperlipidemia: Secondary | ICD-10-CM | POA: Diagnosis not present

## 2018-10-25 DIAGNOSIS — E039 Hypothyroidism, unspecified: Secondary | ICD-10-CM

## 2018-10-25 DIAGNOSIS — E1165 Type 2 diabetes mellitus with hyperglycemia: Secondary | ICD-10-CM | POA: Diagnosis not present

## 2018-10-25 LAB — POCT GLYCOSYLATED HEMOGLOBIN (HGB A1C): Hemoglobin A1C: 8.2 % — AB (ref 4.0–5.6)

## 2018-10-25 NOTE — Progress Notes (Signed)
[facility] [place], [place] [address]  Internal MEDICINE  Office Visit Note  Patient Name: Travis Higgins  [id number]  [id number]  Date of Service: 11/11/2018  Chief Complaint  Patient presents with  . Diabetes  . Hypothyroidism    Blood sugars have been elevated recently. States that he is eating too many sweets and carbohydrates and not exercising enough.   Diabetes  He presents for his follow-up diabetic visit. He has type 2 diabetes mellitus. No MedicAlert identification noted. His disease course has been improving. There are no hypoglycemic associated symptoms. Pertinent negatives for hypoglycemia include no dizziness, headaches, nervousness/anxiousness or tremors. There are no diabetic associated symptoms. Pertinent negatives for diabetes include no chest pain, no polydipsia and no polyuria. There are no hypoglycemic complications. Symptoms are stable. Risk factors for coronary artery disease include diabetes mellitus, dyslipidemia and male sex. Current diabetic treatment includes oral agent (monotherapy). He is compliant with treatment most of the time. His weight is stable. He is following a generally healthy diet. Meal planning includes avoidance of concentrated sweets. He has not had a previous visit with a dietitian. He participates in exercise intermittently. There is no change in his home blood glucose trend. An ACE inhibitor/angiotensin II receptor blocker is not being taken.       Current Medication: Outpatient Encounter Medications as of 10/25/2018  Medication Sig  . acetaminophen (TYLENOL) 500 MG tablet Take 500 mg by mouth daily as needed for moderate pain or headache.  Marland Kitchen aspirin EC 81 MG tablet Take 1 tablet (81 mg total) by mouth daily.  Marland Kitchen glipiZIDE (GLUCOTROL) 5 MG tablet Take 1 tablet (5 mg total) by mouth 2 (two) times daily.  Marland Kitchen glucose blood (ACCU-CHEK GUIDE) test strip Blood sugar testing daily  . Lancets Misc. (ACCU-CHEK FASTCLIX  LANCET) KIT 1 each by Does not apply route daily.  Marland Kitchen levothyroxine (SYNTHROID, LEVOTHROID) 50 MCG tablet Take 1 tablet (50 mcg total) by mouth daily before breakfast.  . meloxicam (MOBIC) 7.5 MG tablet Take 1 tablet (7.5 mg total) by mouth daily.  . rosuvastatin (CRESTOR) 10 MG tablet Take 1 tablet (10 mg total) by mouth daily.   No facility-administered encounter medications on file as of 10/25/2018.     Surgical History: Past Surgical History:  Procedure Laterality Date  . COLONOSCOPY    . COLONOSCOPY WITH PROPOFOL N/A 09/20/2015   Procedure: COLONOSCOPY WITH PROPOFOL;  Surgeon: Lucilla Lame, MD;  Location: [place];  Service: Endoscopy;  Laterality: N/A;  . POLYPECTOMY  09/20/2015   Procedure: POLYPECTOMY INTESTINAL;  Surgeon: Lucilla Lame, MD;  Location: [place];  Service: Endoscopy;;  Sigmoid colon polyp x 1 Rectal polyp x 2    Medical History: Past Medical History:  Diagnosis Date  . Anemia    in past  . CAD (coronary artery disease)    Cardiac catheterization done in July 2015 in [place] by Dr. Jori Moll fields showed mild nonobstructive disease in the mid LAD and distal left circumflex with normal ejection fraction.  . Diabetes mellitus without complication ([place])   . Thyroid disease     Family History: History reviewed. No pertinent family history.  Social History   Socioeconomic History  . Marital status: Married    Spouse name: Not on file  . Number of children: Not on file  . Years of education: Not on file  . Highest education level: Not on file  Occupational History  . Not on file  Social Needs  .  Financial resource strain: Not on file  . Food insecurity:    Worry: Not on file    Inability: Not on file  . Transportation needs:    Medical: Not on file    Non-medical: Not on file  Tobacco Use  . Smoking status: Former Research scientist (life sciences)  . Smokeless tobacco: Never Used  . Tobacco comment: quit approx 2013  Substance and Sexual Activity  .  Alcohol use: No  . Drug use: No  . Sexual activity: Not on file  Lifestyle  . Physical activity:    Days per week: Not on file    Minutes per session: Not on file  . Stress: Not on file  Relationships  . Social connections:    Talks on phone: Not on file    Gets together: Not on file    Attends religious service: Not on file    Active member of club or organization: Not on file    Attends meetings of clubs or organizations: Not on file    Relationship status: Not on file  . Intimate partner violence:    Fear of current or ex partner: Not on file    Emotionally abused: Not on file    Physically abused: Not on file    Forced sexual activity: Not on file  Other Topics Concern  . Not on file  Social History Narrative  . Not on file      Review of Systems  Constitutional: Negative for activity change, chills and unexpected weight change.  HENT: Negative for congestion, postnasal drip, rhinorrhea, sneezing and sore throat.   Respiratory: Negative for cough, chest tightness, shortness of breath and wheezing.   Cardiovascular: Negative for chest pain and palpitations.  Gastrointestinal: Negative for abdominal pain, constipation, diarrhea, nausea and vomiting.  Endocrine: Negative for cold intolerance, heat intolerance, polydipsia and polyuria.       Blood sugars stable but elevated.  Also states that he is taking thyroid medication every day.  Musculoskeletal: Negative for arthralgias, back pain, joint swelling and neck pain.  Skin: Negative for rash.  Neurological: Negative for dizziness, tremors, numbness and headaches.  Hematological: Negative for adenopathy. Does not bruise/bleed easily.  Psychiatric/Behavioral: Negative for behavioral problems (Depression), dysphoric mood, sleep disturbance and suicidal ideas. The patient is not nervous/anxious.     Today's Vitals   10/25/18 1405  BP: 140/80  Pulse: 60  Resp: 16  SpO2: 94%  Weight: 177 lb (80.3 kg)  Height: '5\' 5"'$   (1.651 m)   Body mass index is 29.45 kg/m.  Physical Exam Vitals signs and nursing note reviewed.  Constitutional:      General: He is not in acute distress.    Appearance: Normal appearance. He is well-developed. He is not diaphoretic.  HENT:     Head: Normocephalic and atraumatic.     Right Ear: Ear canal normal.     Left Ear: Ear canal normal.     Nose: Nose normal.     Mouth/Throat:     Pharynx: No oropharyngeal exudate.  Eyes:     Conjunctiva/sclera: Conjunctivae normal.     Pupils: Pupils are equal, round, and reactive to light.  Neck:     Musculoskeletal: Normal range of motion and neck supple.     Thyroid: No thyromegaly.     Vascular: No carotid bruit or JVD.     Trachea: No tracheal deviation.  Cardiovascular:     Rate and Rhythm: Normal rate and regular rhythm.     Heart sounds:  Normal heart sounds. No murmur. No friction rub. No gallop.   Pulmonary:     Effort: Pulmonary effort is normal. No respiratory distress.     Breath sounds: Normal breath sounds. No wheezing or rales.  Chest:     Chest wall: No tenderness.  Musculoskeletal: Normal range of motion.  Lymphadenopathy:     Cervical: No cervical adenopathy.  Neurological:     Mental Status: He is alert and oriented to person, place, and time.     Cranial Nerves: No cranial nerve deficit.  Psychiatric:        Behavior: Behavior normal.        Thought Content: Thought content normal.        Judgment: Judgment normal.   Assessment/Plan: 1. Uncontrolled type 2 diabetes mellitus with hyperglycemia ([facility]) - POCT HgB A1C 8.2 today, up from 8.0 at most recent visit. Discussed increasing diabetic medication, however, he really wishes to try modifying diet and increasing exercise. Recheck HgbA1c at his next visit and will adjust medication as indicated at next visit .  2. Acquired hypothyroidism Stable. Continue levothyroxine as prescribed   3. Mixed hyperlipidemia Continue crestor as prescribed   General  Counseling: Glenda verbalizes understanding of the findings of todays visit and agrees with plan of treatment. I have discussed any further diagnostic evaluation that may be needed or ordered today. We also reviewed his medications today. he has been encouraged to call the office with any questions or concerns that should arise related to todays visit.  Diabetes Counseling:  1. Addition of ACE inh/ ARB'S for nephroprotection. Microalbumin is updated  2. Diabetic foot care, prevention of complications. Podiatry consult 3. Exercise and lose weight.  4. Diabetic eye examination, Diabetic eye exam is updated  5. Monitor blood sugar closlely. nutrition counseling.  6. Sign and symptoms of hypoglycemia including shaking sweating,confusion and headaches.  This patient was seen by Leretha Pol FNP Collaboration with Dr Lavera Guise as a part of collaborative care agreement   Orders Placed This Encounter  Procedures  . POCT HgB A1C     Time spent: 25 Minutes      Dr Lavera Guise Internal medicine

## 2018-11-11 ENCOUNTER — Telehealth: Payer: Self-pay

## 2018-11-11 NOTE — Telephone Encounter (Signed)
Spoke with patient to schedule overdue F/U with Dr. Fletcher Anon. Patient declined to schedule. "Not at this time-if I need an appointment I will call back."

## 2019-01-02 DIAGNOSIS — M79672 Pain in left foot: Secondary | ICD-10-CM | POA: Diagnosis not present

## 2019-01-02 DIAGNOSIS — M722 Plantar fascial fibromatosis: Secondary | ICD-10-CM | POA: Diagnosis not present

## 2019-01-02 DIAGNOSIS — M7732 Calcaneal spur, left foot: Secondary | ICD-10-CM | POA: Diagnosis not present

## 2019-01-15 ENCOUNTER — Other Ambulatory Visit: Payer: Self-pay

## 2019-01-15 DIAGNOSIS — E039 Hypothyroidism, unspecified: Secondary | ICD-10-CM

## 2019-01-15 DIAGNOSIS — E1165 Type 2 diabetes mellitus with hyperglycemia: Secondary | ICD-10-CM

## 2019-01-15 MED ORDER — GLIPIZIDE 5 MG PO TABS: 5.0000 mg | ORAL_TABLET | Freq: Two times a day (BID) | ORAL | 1 refills | Status: DC

## 2019-01-15 MED ORDER — LEVOTHYROXINE SODIUM 50 MCG PO TABS: 50.0000 ug | ORAL_TABLET | Freq: Every day | ORAL | 1 refills | Status: DC

## 2019-01-17 ENCOUNTER — Other Ambulatory Visit: Payer: Self-pay

## 2019-01-17 DIAGNOSIS — E782 Mixed hyperlipidemia: Secondary | ICD-10-CM

## 2019-01-17 MED ORDER — ROSUVASTATIN CALCIUM 10 MG PO TABS: 10.0000 mg | ORAL_TABLET | Freq: Every day | ORAL | 1 refills | Status: DC

## 2019-01-23 DIAGNOSIS — M722 Plantar fascial fibromatosis: Secondary | ICD-10-CM | POA: Diagnosis not present

## 2019-01-27 ENCOUNTER — Encounter: Payer: Self-pay | Admitting: Nurse Practitioner

## 2019-01-27 ENCOUNTER — Other Ambulatory Visit: Payer: Self-pay

## 2019-01-27 ENCOUNTER — Ambulatory Visit (INDEPENDENT_AMBULATORY_CARE_PROVIDER_SITE_OTHER): Payer: Medicare Other | Admitting: Nurse Practitioner

## 2019-01-27 VITALS — BP 181/90 | HR 45 | Resp 16 | Ht 65.0 in | Wt 176.0 lb

## 2019-01-27 DIAGNOSIS — Z0001 Encounter for general adult medical examination with abnormal findings: Secondary | ICD-10-CM | POA: Diagnosis not present

## 2019-01-27 DIAGNOSIS — E039 Hypothyroidism, unspecified: Secondary | ICD-10-CM | POA: Diagnosis not present

## 2019-01-27 DIAGNOSIS — I10 Essential (primary) hypertension: Secondary | ICD-10-CM | POA: Diagnosis not present

## 2019-01-27 DIAGNOSIS — R300 Dysuria: Secondary | ICD-10-CM | POA: Insufficient documentation

## 2019-01-27 DIAGNOSIS — E1165 Type 2 diabetes mellitus with hyperglycemia: Secondary | ICD-10-CM

## 2019-01-27 LAB — POCT GLYCOSYLATED HEMOGLOBIN (HGB A1C): Hemoglobin A1C: 7.6 % — AB (ref 4.0–5.6)

## 2019-01-27 MED ORDER — LOSARTAN POTASSIUM 25 MG PO TABS: 25.0000 mg | ORAL_TABLET | Freq: Every day | ORAL | 1 refills | Status: DC

## 2019-01-27 NOTE — Progress Notes (Signed)
ur

## 2019-01-27 NOTE — Progress Notes (Signed)
[facility] [place], [place] [address]  Internal MEDICINE  Office Visit Note  Patient Name: Travis Higgins  [id number]  [id number]  Date of Service: 01/27/2019   Pt is here for routine health maintenance examination    Chief Complaint  Patient presents with  . Annual Exam  . Anemia  . Diabetes  . Hyperlipidemia  . Hypertension     The patient is here for health maintenance exam. Blood sugars have improved. HgbA1c is 7.6 today, down from 8.3 at last check. He is due to have diabetic eye exam. Blood pressure very high today. This is unusual. Blood pressure generally well managed. He denies chest pain, chest pressure, or headache. It is time for him to have routine, fasting labs.     Current Medication: Outpatient Encounter Medications as of 01/27/2019  Medication Sig  . acetaminophen (TYLENOL) 500 MG tablet Take 500 mg by mouth daily as needed for moderate pain or headache.  Marland Kitchen aspirin EC 81 MG tablet Take 1 tablet (81 mg total) by mouth daily.  Marland Kitchen glipiZIDE (GLUCOTROL) 5 MG tablet Take 1 tablet (5 mg total) by mouth 2 (two) times daily.  Marland Kitchen glucose blood (ACCU-CHEK GUIDE) test strip Blood sugar testing daily  . Lancets Misc. (ACCU-CHEK FASTCLIX LANCET) KIT 1 each by Does not apply route daily.  Marland Kitchen levothyroxine (SYNTHROID) 50 MCG tablet Take 1 tablet (50 mcg total) by mouth daily before breakfast.  . meloxicam (MOBIC) 7.5 MG tablet Take 1 tablet (7.5 mg total) by mouth daily.  . rosuvastatin (CRESTOR) 10 MG tablet Take 1 tablet (10 mg total) by mouth daily.  Marland Kitchen losartan (COZAAR) 25 MG tablet Take 1 tablet (25 mg total) by mouth daily.   No facility-administered encounter medications on file as of 01/27/2019.     Surgical History: Past Surgical History:  Procedure Laterality Date  . COLONOSCOPY    . COLONOSCOPY WITH PROPOFOL N/A 09/20/2015   Procedure: COLONOSCOPY WITH PROPOFOL;  Surgeon: Lucilla Lame, MD;  Location: [facility];  Service:  Endoscopy;  Laterality: N/A;  . POLYPECTOMY  09/20/2015   Procedure: POLYPECTOMY INTESTINAL;  Surgeon: Lucilla Lame, MD;  Location: [facility];  Service: Endoscopy;;  Sigmoid colon polyp x 1 Rectal polyp x 2    Medical History: Past Medical History:  Diagnosis Date  . Anemia    in past  . CAD (coronary artery disease)    Cardiac catheterization done in July 2015 in [place] by Dr. Jori Moll fields showed mild nonobstructive disease in the mid LAD and distal left circumflex with normal ejection fraction.  . Diabetes mellitus without complication ([place])   . Thyroid disease     Family History: History reviewed. No pertinent family history.    Review of Systems  Constitutional: Negative for activity change, chills and unexpected weight change.  HENT: Negative for congestion, postnasal drip, rhinorrhea, sneezing and sore throat.   Respiratory: Negative for cough, chest tightness, shortness of breath and wheezing.   Cardiovascular: Negative for chest pain and palpitations.       Very elevated blood pressure today.   Gastrointestinal: Negative for abdominal pain, constipation, diarrhea, nausea and vomiting.  Endocrine: Negative for cold intolerance, heat intolerance, polydipsia and polyuria.       Improved blood sugars since last visit.   Musculoskeletal: Negative for arthralgias, back pain, joint swelling and neck pain.  Skin: Negative for rash.  Neurological: Negative for dizziness, tremors, numbness and headaches.  Hematological: Negative for adenopathy. Does not bruise/bleed easily.  Psychiatric/Behavioral: Negative for behavioral problems (Depression), dysphoric mood, sleep disturbance and suicidal ideas. The patient is not nervous/anxious.    Today's Vitals   01/27/19 1438  BP: (!) 181/90  Pulse: (!) 45  Resp: 16  SpO2: 98%  Weight: 176 lb (79.8 kg)  Height: '5\' 5"'$  (1.651 m)   Body mass index is 29.29 kg/m.  Physical Exam Vitals signs and nursing note reviewed.   Constitutional:      General: He is not in acute distress.    Appearance: Normal appearance. He is well-developed. He is not diaphoretic.  HENT:     Head: Normocephalic and atraumatic.     Right Ear: Ear canal normal.     Left Ear: Ear canal normal.     Nose: Nose normal.     Mouth/Throat:     Pharynx: No oropharyngeal exudate.  Eyes:     Conjunctiva/sclera: Conjunctivae normal.     Pupils: Pupils are equal, round, and reactive to light.  Neck:     Musculoskeletal: Normal range of motion and neck supple.     Thyroid: No thyromegaly.     Vascular: No carotid bruit or JVD.     Trachea: No tracheal deviation.  Cardiovascular:     Rate and Rhythm: Normal rate and regular rhythm.     Pulses: Normal pulses.     Heart sounds: Normal heart sounds. No murmur. No friction rub. No gallop.   Pulmonary:     Effort: Pulmonary effort is normal. No respiratory distress.     Breath sounds: Normal breath sounds. No wheezing or rales.  Chest:     Chest wall: No tenderness.  Abdominal:     General: Bowel sounds are normal.     Palpations: Abdomen is soft.     Tenderness: There is abdominal tenderness.  Musculoskeletal: Normal range of motion.  Lymphadenopathy:     Cervical: No cervical adenopathy.  Neurological:     Mental Status: He is alert and oriented to person, place, and time.     Cranial Nerves: No cranial nerve deficit.  Psychiatric:        Behavior: Behavior normal.        Thought Content: Thought content normal.        Judgment: Judgment normal.    Depression screen [facility] 2/9 01/27/2019 10/25/2018 08/15/2018 07/26/2018 01/22/2018  Decreased Interest 0 0 0 0 0  Down, Depressed, Hopeless 0 0 0 0 0  PHQ - 2 Score 0 0 0 0 0    Functional Status Survey: Is the patient deaf or have difficulty hearing?: No Does the patient have difficulty seeing, even when wearing glasses/contacts?: No Does the patient have difficulty concentrating, remembering, or making decisions?: No Does the patient  have difficulty walking or climbing stairs?: No Does the patient have difficulty dressing or bathing?: No Does the patient have difficulty doing errands alone such as visiting a doctor's office or shopping?: No  MMSE - [place] Exam 01/22/2018  Orientation to time 5  Orientation to Place 5  Registration 3  Attention/ Calculation 5  Recall 3  Language- name 2 objects 2  Language- repeat 1  Language- follow 3 step command 3  Language- read & follow direction 1  Write a sentence 1  Copy design 1  Total score 30    Fall Risk  01/27/2019 10/25/2018 08/15/2018 07/26/2018 01/22/2018  Falls in the past year? 0 0 0 0 No  Comment - - - - -  Number falls in past  yr: - 0 - - -      LABS: Recent Results (from the past 2160 hour(s))  POCT HgB A1C     Status: Abnormal   Collection Time: 01/27/19  3:02 PM  Result Value Ref Range   Hemoglobin A1C 7.6 (A) 4.0 - 5.6 %   HbA1c POC (<> result, manual entry)     HbA1c, POC (prediabetic range)     HbA1c, POC (controlled diabetic range)     Assessment/Plan: 1. Encounter for general adult medical examination with abnormal findings Annual health maintenance exam today. Labs slip given to have routine, fasting labs drawn. Discuss at next visit.   2. Type 2 diabetes mellitus with hyperglycemia, unspecified whether long term insulin use (HCC) - POCT HgB A1C 7.6, improved from 8.3 at his last check. Continue diabetic medication as prescribed. Refer for diabetic eye exam.  - Ambulatory referral to Ophthalmology  3. Essential hypertension Start losartan '25mg'$  daily. Limit salt and increase water intake. Monitor blood pressure at home. Goal is to get blood pressure back to 140/80 - losartan (COZAAR) 25 MG tablet; Take 1 tablet (25 mg total) by mouth daily.  Dispense: 30 tablet; Refill: 1  4. Acquired hypothyroidism Check thyroid panel. Adjust levothyroxine as indicated  5. Dysuria - Urinalysis, Routine w reflex microscopic  General Counseling:  Bravlio verbalizes understanding of the findings of todays visit and agrees with plan of treatment. I have discussed any further diagnostic evaluation that may be needed or ordered today. We also reviewed his medications today. he has been encouraged to call the office with any questions or concerns that should arise related to todays visit.    Counseling:  Hypertension Counseling:   The following hypertensive lifestyle modification were recommended and discussed:  1. Limiting alcohol intake to less than 1 oz/day of ethanol:(24 oz of beer or 8 oz of wine or 2 oz of 100-proof whiskey). 2. Take baby ASA 81 mg daily. 3. Importance of regular aerobic exercise and losing weight. 4. Reduce dietary saturated fat and cholesterol intake for overall cardiovascular health. 5. Maintaining adequate dietary potassium, calcium, and magnesium intake. 6. Regular monitoring of the blood pressure. 7. Reduce sodium intake to less than 100 mmol/day (less than 2.3 gm of sodium or less than 6 gm of sodium choride)   This patient was seen by [place] with Dr Lavera Guise as a part of collaborative care agreement  Orders Placed This Encounter  Procedures  . Urinalysis, Routine w reflex microscopic  . Ambulatory referral to Ophthalmology  . POCT HgB A1C    Meds ordered this encounter  Medications  . losartan (COZAAR) 25 MG tablet    Sig: Take 1 tablet (25 mg total) by mouth daily.    Dispense:  30 tablet    Refill:  1    Order Specific Question:   Supervising Provider    Answer:   Lavera Guise [[id number]]    Time spent: [place], MD  Internal Medicine

## 2019-01-28 ENCOUNTER — Other Ambulatory Visit
Admission: RE | Admit: 2019-01-28 | Discharge: 2019-01-28 | Disposition: A | Discharge status: Home / Self Care | Payer: Medicare Other | Source: Ambulatory Visit | Attending: Nurse Practitioner | Admitting: Nurse Practitioner

## 2019-01-28 ENCOUNTER — Other Ambulatory Visit: Payer: Self-pay

## 2019-01-28 DIAGNOSIS — R5381 Other malaise: Secondary | ICD-10-CM | POA: Diagnosis not present

## 2019-01-28 DIAGNOSIS — E538 Deficiency of other specified B group vitamins: Secondary | ICD-10-CM | POA: Diagnosis not present

## 2019-01-28 DIAGNOSIS — Z79899 Other long term (current) drug therapy: Secondary | ICD-10-CM | POA: Diagnosis not present

## 2019-01-28 DIAGNOSIS — E782 Mixed hyperlipidemia: Secondary | ICD-10-CM | POA: Insufficient documentation

## 2019-01-28 LAB — T4, FREE: Free T4: 1.04 ng/dL (ref 0.61–1.12)

## 2019-01-28 LAB — URINALYSIS, ROUTINE W REFLEX MICROSCOPIC
Bilirubin, UA: NEGATIVE
Glucose, UA: NEGATIVE
Ketones, UA: NEGATIVE
Leukocytes,UA: NEGATIVE
Nitrite, UA: NEGATIVE
Protein,UA: NEGATIVE
RBC, UA: NEGATIVE
Specific Gravity, UA: <=1.005 — AB (ref 1.005–1.030)
Urobilinogen, Ur: 0.2 mg/dL (ref 0.2–1.0)
pH, UA: 7.5 (ref 5.0–7.5)

## 2019-01-28 LAB — COMPREHENSIVE METABOLIC PANEL
ALT: 31 U/L (ref 0–44)
AST: 23 U/L (ref 15–41)
Albumin: 4.1 g/dL (ref 3.5–5.0)
Alkaline Phosphatase: 94 U/L (ref 38–126)
Anion gap: 7 (ref 5–15)
BUN: 11 mg/dL (ref 8–23)
CO2: 27 mmol/L (ref 22–32)
Calcium: 9.6 mg/dL (ref 8.9–10.3)
Chloride: 101 mmol/L (ref 98–111)
Creatinine, Ser: 0.8 mg/dL (ref 0.61–1.24)
GFR calc Af Amer: >60 mL/min (ref >60)
GFR calc non Af Amer: >60 mL/min (ref >60)
Glucose, Bld: 118 mg/dL — ABNORMAL HIGH (ref 70–99)
Potassium: 4.8 mmol/L (ref 3.5–5.1)
Sodium: 135 mmol/L (ref 135–145)
Total Bilirubin: 0.9 mg/dL (ref 0.3–1.2)
Total Protein: 7.1 g/dL (ref 6.5–8.1)

## 2019-01-28 LAB — VITAMIN B12: Vitamin B-12: 192 pg/mL (ref 180–914)

## 2019-01-28 LAB — LIPID PANEL
Cholesterol: 185 mg/dL (ref 0–200)
HDL: 47 mg/dL (ref >40)
LDL Cholesterol: 121 mg/dL — ABNORMAL HIGH (ref 0–99)
Total CHOL/HDL Ratio: 3.9 RATIO
Triglycerides: 84 mg/dL (ref <150)
VLDL: 17 mg/dL (ref 0–40)

## 2019-01-28 LAB — CBC
HCT: 45.9 % (ref 39.0–52.0)
Hemoglobin: 14.3 g/dL (ref 13.0–17.0)
MCH: 25.7 pg — ABNORMAL LOW (ref 26.0–34.0)
MCHC: 31.2 g/dL (ref 30.0–36.0)
MCV: 82.4 fL (ref 80.0–100.0)
Platelets: 197 10*3/uL (ref 150–400)
RBC: 5.57 MIL/uL (ref 4.22–5.81)
RDW: 15 % (ref 11.5–15.5)
WBC: 7.6 10*3/uL (ref 4.0–10.5)
nRBC: 0 % (ref 0.0–0.2)

## 2019-01-28 LAB — TSH: TSH: 6.553 u[IU]/mL — ABNORMAL HIGH (ref 0.350–4.500)

## 2019-01-28 LAB — FOLATE: Folate: 19.2 ng/mL (ref >5.9)

## 2019-01-28 LAB — PSA: Prostatic Specific Antigen: 0.37 ng/mL (ref 0.00–4.00)

## 2019-01-28 NOTE — Progress Notes (Signed)
I saw him yesterday for CPE. He is on synthroid. Blood pressure was very high. He is coming back 12/20/2018 to discuss lab results. Will also restart him on b12 injections.

## 2019-01-28 NOTE — Progress Notes (Signed)
Sorry, I will increase his dose of synthroid at that visit also.

## 2019-01-29 LAB — VITAMIN D 25 HYDROXY (VIT D DEFICIENCY, FRACTURES): Vit D, 25-Hydroxy: 25.7 ng/mL — ABNORMAL LOW (ref 30.0–100.0)

## 2019-02-10 DIAGNOSIS — M722 Plantar fascial fibromatosis: Secondary | ICD-10-CM | POA: Diagnosis not present

## 2019-02-17 NOTE — Progress Notes (Deleted)
Cardiology Office Note    Date:  02/17/2019   ID:  Travis Higgins, Travis Higgins 08-Oct-1950, MRN [id number]  PCP:  Lavera Guise, MD  Cardiologist:  Kathlyn Sacramento, MD  Electrophysiologist:  None   Chief Complaint: Follow up  History of Present Illness:   Travis Higgins is a 68 y.o. male with history of patient reported CAD, DM2, hypothyroidism, and B12 deficiency, prior tobacco abuse who presents for follow-up.  Patient has reported previously undergoing a cardiac cath in either 2014 or 2015 while living in [place] with nonobstructive disease in the mid LAD and distal LCx with normal EF.  He was seen and 2018 for exertional dyspnea and fatigue and underwent treadmill Myoview which was intermediate risk with evidence of prior anterior/anteroseptal infarct as well as apical inferior defect.  The distribution was consistent with a distal LAD as well as OM branch territory.  EF was 49%.  Follow-up echo showed an EF of 60 to 65% with no significant valvular abnormalities.  Cardiac cath was recommended though declined by the patient.  He was last seen by Dr. Fletcher Anon in 11/2017 for follow-up and denied any symptoms concerning for angina.  He did note bilateral leg aching at night that was not associated with walking with the patient feels like this was related to his Crestor.  He wanted to continue medical therapy at that time.  His Crestor was decreased to 10 mg daily.  ***   Labs: 01/2019 -TSH 6.553, free T4 normal, total cholesterol 185, triglyceride 84, HDL 47, LDL 121, potassium 4.8, serum creatinine 0.8, albumin 4.1, AST/ALT normal, Hgb 14.3, PLT 197   Past Medical History:  Diagnosis Date  . Anemia    in past  . CAD (coronary artery disease)    Cardiac catheterization done in July 2015 in [place] by Dr. Jori Moll fields showed mild nonobstructive disease in the mid LAD and distal left circumflex with normal ejection fraction.  . Diabetes mellitus without complication ([place])   . Thyroid  disease     Past Surgical History:  Procedure Laterality Date  . COLONOSCOPY    . COLONOSCOPY WITH PROPOFOL N/A 09/20/2015   Procedure: COLONOSCOPY WITH PROPOFOL;  Surgeon: Lucilla Lame, MD;  Location: [facility];  Service: Endoscopy;  Laterality: N/A;  . POLYPECTOMY  09/20/2015   Procedure: POLYPECTOMY INTESTINAL;  Surgeon: Lucilla Lame, MD;  Location: [facility];  Service: Endoscopy;;  Sigmoid colon polyp x 1 Rectal polyp x 2    Current Medications: No outpatient medications have been marked as taking for the 02/21/19 encounter (Appointment) with Rise Mu, PA-C.    Allergies:   Levothyroxine and Lisinopril   Social History   Socioeconomic History  . Marital status: Married    Spouse name: Not on file  . Number of children: Not on file  . Years of education: Not on file  . Highest education level: Not on file  Occupational History  . Not on file  Social Needs  . Financial resource strain: Not on file  . Food insecurity    Worry: Not on file    Inability: Not on file  . Transportation needs    Medical: Not on file    Non-medical: Not on file  Tobacco Use  . Smoking status: Former Research scientist (life sciences)  . Smokeless tobacco: Never Used  . Tobacco comment: quit approx 2013  Substance and Sexual Activity  . Alcohol use: No  . Drug use: No  . Sexual activity: Not on file  Lifestyle  .  Physical activity    Days per week: Not on file    Minutes per session: Not on file  . Stress: Not on file  Relationships  . Social Herbalist on phone: Not on file    Gets together: Not on file    Attends religious service: Not on file    Active member of club or organization: Not on file    Attends meetings of clubs or organizations: Not on file    Relationship status: Not on file  Other Topics Concern  . Not on file  Social History Narrative  . Not on file     Family History:  The patient's family history is not on file.  ROS:   ROS   EKGs/Labs/Other  Studies Reviewed:    Studies reviewed were summarized above. The additional studies were reviewed today:  2D Echo 05/2017: - Left ventricle: The cavity size was normal. There was moderate   concentric hypertrophy. Systolic function was normal. The   estimated ejection fraction was in the range of 60% to 65%. Wall   motion was normal; there were no regional wall motion   abnormalities. Doppler parameters are consistent with abnormal   left ventricular relaxation (grade 1 diastolic dysfunction). - Aortic root: The aortic root was mildly dilated 3.7 cm - Ascending aorta: The ascending aorta was mildly dilated, 3.7 cm - Left atrium: The atrium was normal in size. - Right ventricle: Systolic function was normal.  Impressions:  - Sinus bradycardia rate 52 bpm. __________  Myoview 05/2017:  Blood pressure demonstrated a normal response to exercise.  T wave inversion was noted during stress in the V2, V3, V4 and V5 leads.  There was no ST segment deviation noted during stress.  Defect 1: There is a medium defect of severe severity present in the mid anteroseptal, mid anterolateral, apical septal, apical inferior and apex location. This is suggestive of prior infarct with significant peri-infarct ischemia. This seems to be in the distribution of distal LAD as well as an OM branch.  This is an intermediate risk study.  Nuclear stress EF: 49%.   EKG:  EKG is ordered today.  The EKG ordered today demonstrates ***  Recent Labs: 01/28/2019: ALT 31; BUN 11; Creatinine, Ser 0.80; Hemoglobin 14.3; Platelets 197; Potassium 4.8; Sodium 135; TSH 6.553  Recent Lipid Panel    Component Value Date/Time   CHOL 185 01/28/2019 0736   TRIG 84 01/28/2019 0736   HDL 47 01/28/2019 0736   CHOLHDL 3.9 01/28/2019 0736   VLDL 17 01/28/2019 0736   LDLCALC 121 (H) 01/28/2019 0736    PHYSICAL EXAM:    VS:  There were no vitals taken for this visit.  BMI: There is no height or weight on file to  calculate BMI.  Physical Exam  Wt Readings from Last 3 Encounters:  01/27/19 176 lb (79.8 kg)  10/25/18 177 lb (80.3 kg)  08/15/18 173 lb 12.8 oz (78.8 kg)     ASSESSMENT & PLAN:   1. ***  Disposition: F/u with Dr. Fletcher Anon or an APP in ***.   Medication Adjustments/Labs and Tests Ordered: Current medicines are reviewed at length with the patient today.  Concerns regarding medicines are outlined above. Medication changes, Labs and Tests ordered today are summarized above and listed in the Patient Instructions accessible in Encounters.   Signed, Christell Faith, PA-C 02/17/2019 10:23 AM     [place] [address] [place] [address] [place], [place] [address] (  [phone] 

## 2019-02-20 ENCOUNTER — Encounter: Payer: Self-pay | Admitting: Nurse Practitioner

## 2019-02-20 ENCOUNTER — Other Ambulatory Visit: Payer: Self-pay

## 2019-02-20 ENCOUNTER — Ambulatory Visit (INDEPENDENT_AMBULATORY_CARE_PROVIDER_SITE_OTHER): Payer: Medicare Other | Admitting: Nurse Practitioner

## 2019-02-20 VITALS — BP 156/79 | HR 57 | Resp 16 | Ht 65.0 in | Wt 174.0 lb

## 2019-02-20 DIAGNOSIS — M722 Plantar fascial fibromatosis: Secondary | ICD-10-CM | POA: Diagnosis not present

## 2019-02-20 DIAGNOSIS — E1165 Type 2 diabetes mellitus with hyperglycemia: Secondary | ICD-10-CM

## 2019-02-20 DIAGNOSIS — I10 Essential (primary) hypertension: Secondary | ICD-10-CM

## 2019-02-20 DIAGNOSIS — E538 Deficiency of other specified B group vitamins: Secondary | ICD-10-CM | POA: Diagnosis not present

## 2019-02-20 DIAGNOSIS — M79671 Pain in right foot: Secondary | ICD-10-CM | POA: Diagnosis not present

## 2019-02-20 DIAGNOSIS — E039 Hypothyroidism, unspecified: Secondary | ICD-10-CM | POA: Diagnosis not present

## 2019-02-20 MED ORDER — LEVOTHYROXINE SODIUM 75 MCG PO TABS: 75.0000 ug | ORAL_TABLET | Freq: Every day | ORAL | 3 refills | Status: DC

## 2019-02-20 MED ORDER — LEVOTHYROXINE SODIUM 75 MCG PO TABS: 75.0000 ug | ORAL_TABLET | Freq: Every day | ORAL | 1 refills | Status: DC

## 2019-02-20 MED ORDER — LOSARTAN POTASSIUM-HCTZ 50-12.5 MG PO TABS: 1.0000 | ORAL_TABLET | Freq: Every day | ORAL | 1 refills | Status: DC

## 2019-02-20 MED ORDER — CYANOCOBALAMIN 1000 MCG/ML IJ SOLN
1000.0000 ug | Freq: Once | INTRAMUSCULAR | Status: AC
  Administered 2019-02-20: 1000 ug via INTRAMUSCULAR

## 2019-02-20 NOTE — Progress Notes (Signed)
[facility] [place], [place] [address]  Internal MEDICINE  Office Visit Note  Patient Name: Travis Higgins  [id number]  [id number]  Date of Service: 02/20/2019  Chief Complaint  Patient presents with  . Hypertension  . Hypothyroidism  . Labs Only    review labs   . B12 Injection    The patient is here for routine follow up. Blood pressure slightly elevated. He and his wife have also brought blood pressure log. In general, systolic blood pressure is running between 130 and 180. Diastolic pressures are between 70 and 80. Currently on losartan 36m every day. He also had routine, fasting labs done. Thyroid is leaning toward hypothyroid. He is taking levothyroxine 537m daily. He also has very low/normal B12 level.       Current Medication: Outpatient Encounter Medications as of 02/20/2019  Medication Sig  . acetaminophen (TYLENOL) 500 MG tablet Take 500 mg by mouth daily as needed for moderate pain or headache.  . Marland Kitchenspirin EC 81 MG tablet Take 1 tablet (81 mg total) by mouth daily.  . Marland KitchenlipiZIDE (GLUCOTROL) 5 MG tablet Take 1 tablet (5 mg total) by mouth 2 (two) times daily.  . Marland Kitchenlucose blood (ACCU-CHEK GUIDE) test strip Blood sugar testing daily  . Lancets Misc. (ACCU-CHEK FASTCLIX LANCET) KIT 1 each by Does not apply route daily.  . Marland Kitchenevothyroxine (SYNTHROID) 75 MCG tablet Take 1 tablet (75 mcg total) by mouth daily before breakfast.  . losartan-hydrochlorothiazide (HYZAAR) 50-12.5 MG tablet Take 1 tablet by mouth daily.  . meloxicam (MOBIC) 7.5 MG tablet Take 1 tablet (7.5 mg total) by mouth daily.  . rosuvastatin (CRESTOR) 10 MG tablet Take 1 tablet (10 mg total) by mouth daily.  . [DISCONTINUED] levothyroxine (SYNTHROID) 50 MCG tablet Take 1 tablet (50 mcg total) by mouth daily before breakfast.  . [DISCONTINUED] levothyroxine (SYNTHROID) 75 MCG tablet Take 1 tablet (75 mcg total) by mouth daily before breakfast.  . [DISCONTINUED] losartan (COZAAR) 25 MG  tablet Take 1 tablet (25 mg total) by mouth daily.  . [EXPIRED] cyanocobalamin ((VITAMIN B-12)) injection 1,000 mcg    No facility-administered encounter medications on file as of 02/20/2019.     Surgical History: Past Surgical History:  Procedure Laterality Date  . COLONOSCOPY    . COLONOSCOPY WITH PROPOFOL N/A 09/20/2015   Procedure: COLONOSCOPY WITH PROPOFOL;  Surgeon: DaLucilla LameMD;  [place];  Laterality: N/A;  . POLYPECTOMY  09/20/2015   Procedure: POLYPECTOMY INTESTINAL;  Surgeon: DaLucilla LameMD;  [place]: [place];;  Sigmoid colon polyp x 1 Rectal polyp x 2    Medical History: Past Medical History:  Diagnosis Date  . Anemia    in past  . CAD (coronary artery disease)    [place] Ro[place]ori Moll[place] showed mild nonobstructive disease in the mid LAD and distal left circumflex with normal ejection fraction.  . [place]  . Thyroid disease     Family History: History reviewed. No pertinent family history.  Social History   Socioeconomic History  . Marital status: Married    Spouse name: Not on file  . Number of children: Not on file  . Years of education: Not on file  . Highest education level: Not on file  Occupational History  . Not on file  Social Needs  . Financial resource strain: Not on file  . Food insecurity  Worry: Not on file    Inability: Not on file  . Transportation needs    Medical: Not on file    Non-medical: Not on file  Tobacco Use  . Smoking status: Former Research scientist (life sciences)  . Smokeless tobacco: Never Used  . Tobacco comment: quit approx 2013  Substance and Sexual Activity  . Alcohol use: No  . Drug use: No  . Sexual activity: Not on file  Lifestyle  . Physical activity    Days per week: Not on file    Minutes per session: Not on file  . Stress: Not on file  Relationships  . Social  Herbalist on phone: Not on file    Gets together: Not on file    Attends religious service: Not on file    Active member of club or organization: Not on file    Attends meetings of clubs or organizations: Not on file    Relationship status: Not on file  . Intimate partner violence    Fear of current or ex partner: Not on file    Emotionally abused: Not on file    Physically abused: Not on file    Forced sexual activity: Not on file  Other Topics Concern  . Not on file  Social History Narrative  . Not on file      Review of Systems  Constitutional: Negative for activity change, chills and unexpected weight change.  HENT: Negative for congestion, postnasal drip, rhinorrhea, sneezing and sore throat.   Respiratory: Negative for cough, chest tightness, shortness of breath and wheezing.   Cardiovascular: Negative for chest pain and palpitations.       Blood pressure elevated today, but improved from his last visit .  Gastrointestinal: Negative for abdominal pain, constipation, diarrhea, nausea and vomiting.  Endocrine: Negative for cold intolerance, heat intolerance, polydipsia and polyuria.       Labs indicate hypothyroid  Musculoskeletal: Negative for arthralgias, back pain, joint swelling and neck pain.  Skin: Negative for rash.  Neurological: Negative for dizziness, tremors, numbness and headaches.  Hematological: Negative for adenopathy. Does not bruise/bleed easily.  Psychiatric/Behavioral: Negative for behavioral problems (Depression), dysphoric mood, sleep disturbance and suicidal ideas. The patient is not nervous/anxious.     Today's Vitals   02/20/19 1506  BP: (!) 156/79  Pulse: (!) 57  Resp: 16  SpO2: 97%  Weight: 174 lb (78.9 kg)  Height: _0  (1.651 m)   Body mass index is 28.96 kg/m.  Physical Exam Vitals signs and nursing note reviewed.  Constitutional:      General: He is not in acute distress.    Appearance: Normal appearance. He is  well-developed. He is not diaphoretic.  HENT:     Head: Normocephalic and atraumatic.     Mouth/Throat:     Pharynx: No oropharyngeal exudate.  Eyes:     Pupils: Pupils are equal, round, and reactive to light.  Neck:     Musculoskeletal: Normal range of motion and neck supple.     Thyroid: No thyromegaly.     Vascular: No JVD.     Trachea: No tracheal deviation.  Cardiovascular:     Rate and Rhythm: Normal rate and regular rhythm.     Heart sounds: Normal heart sounds. No murmur. No friction rub. No gallop.   Pulmonary:     Effort: Pulmonary effort is normal. No respiratory distress.     Breath sounds: Normal breath sounds. No wheezing or rales.  Chest:  Chest wall: No tenderness.  Abdominal:     General: Bowel sounds are normal.     Palpations: Abdomen is soft.  Musculoskeletal: Normal range of motion.  Lymphadenopathy:     Cervical: No cervical adenopathy.  Skin:    General: Skin is warm and dry.  Neurological:     Mental Status: He is alert and oriented to person, place, and time.     Cranial Nerves: No cranial nerve deficit.  Psychiatric:        Behavior: Behavior normal.        Thought Content: Thought content normal.        Judgment: Judgment normal.    Assessment/Plan:  1. Essential hypertension Change losartan to losartan/HCTZ 50/12.21m tablets daily. Reviewed heart healthy diet. Encouraged him to conitnue monitoring blood pressure closely.  - losartan-hydrochlorothiazide (HYZAAR) 50-12.5 MG tablet; Take 1 tablet by mouth daily.  Dispense: 90 tablet; Refill: 1  2. Acquired hypothyroidism Increase levothyroxine to 757m daily. Recheck thyroid panel in 2 to 3 months - levothyroxine (SYNTHROID) 75 MCG tablet; Take 1 tablet (75 mcg total) by mouth daily before breakfast.  Dispense: 90 tablet; Refill: 1  3. B12 deficiency Start monthly b12 injections. First injection given today.  - cyanocobalamin ((VITAMIN B-12)) injection 1,000 mcg  4. Type 2 diabetes  mellitus with hyperglycemia, without long-term current use of insulin ([place] diabetic medication as prescribed .  General Counseling: BhSweetwatererbalizes understanding of the findings of todays visit and agrees with plan of treatment. I have discussed any further diagnostic evaluation that may be needed or ordered today. We also reviewed his medications today. he has been encouraged to call the office with any questions or concerns that should arise related to todays visit.   Hypertension Counseling:   The following hypertensive lifestyle modification were recommended and discussed:  1. Limiting alcohol intake to less than 1 oz/day of ethanol:(24 oz of beer or 8 oz of wine or 2 oz of 100-proof whiskey). 2. Take baby ASA 81 mg daily. 3. Importance of regular aerobic exercise and losing weight. 4. Reduce dietary saturated fat and cholesterol intake for overall cardiovascular health. 5. Maintaining adequate dietary potassium, calcium, and magnesium intake. 6. Regular monitoring of the blood pressure. 7. Reduce sodium intake to less than 100 mmol/day (less than 2.3 gm of sodium or less than 6 gm of sodium choride)   This patient was seen by [place] Dr FoLavera Guises a part of collaborative care agreement   Meds ordered this encounter  Medications  . DISCONTD: levothyroxine (SYNTHROID) 75 MCG tablet    Sig: Take 1 tablet (75 mcg total) by mouth daily before breakfast.    Dispense:  30 tablet    Refill:  3    Order Specific Question:   Supervising Provider    Answer:   KHLavera Guise1[place]. losartan-hydrochlorothiazide (HYZAAR) 50-12.5 MG tablet    Sig: Take 1 tablet by mouth daily.    Dispense:  90 tablet    Refill:  1    D/c losartan 2514m  Order Specific Question:   Supervising Provider    Answer:   KHALavera Guise4[[id number]] levothyroxine (SYNTHROID) 75 MCG tablet    Sig: Take 1 tablet (75 mcg total) by mouth daily before breakfast.     Dispense:  90 tablet    Refill:  1    Please fill as 90 day prescription    Order Specific Question:  Supervising Provider    Answer:   Lavera Guise [[id number]]  . cyanocobalamin ((VITAMIN B-12)) injection 1,000 mcg    Time spent: 25 Minutes      Dr Lavera Guise Internal medicine

## 2019-02-21 ENCOUNTER — Ambulatory Visit: Payer: Medicare Other | Admitting: Physician Assistant

## 2019-02-24 DIAGNOSIS — M722 Plantar fascial fibromatosis: Secondary | ICD-10-CM | POA: Diagnosis not present

## 2019-02-27 DIAGNOSIS — M722 Plantar fascial fibromatosis: Secondary | ICD-10-CM | POA: Diagnosis not present

## 2019-02-28 NOTE — Progress Notes (Deleted)
Cardiology Office Note    Date:  02/28/2019   ID:  Travis, Higgins 07/04/1950, MRN [id number]  PCP:  Travis Guise, MD  Cardiologist:  Travis Sacramento, MD  Electrophysiologist:  None   Chief Complaint: Follow-up  History of Present Illness:   Travis Higgins is a 68 y.o. male with history of patient reported CAD, DM2, hypothyroidism, and B12 deficiency, prior tobacco abuse who presents for follow-up.  Patient has reported previously undergoing a cardiac cath in either 2014 or 2015 while living in [place] with nonobstructive disease in the mid LAD and distal LCx with normal EF.  He was seen and 2018 for exertional dyspnea and fatigue and underwent treadmill Myoview which was intermediate risk with evidence of prior anterior/anteroseptal infarct as well as apical inferior defect.  The distribution was consistent with a distal LAD as well as OM branch territory.  EF was 49%.  Follow-up echo showed an EF of 60 to 65% with no significant valvular abnormalities.  Cardiac cath was recommended though declined by the patient.  He was last seen by Dr. Fletcher Anon in 11/2017 for follow-up and denied any symptoms concerning for angina.  He did note bilateral leg aching at night that was not associated with walking with the patient feels like this was related to his Crestor.  He wanted to continue medical therapy at that time.  His Crestor was decreased to 10 mg daily.  ***   Labs: 01/2019 -TSH 6.553, free T4 normal, total cholesterol 185, triglyceride 84, HDL 47, LDL 121, potassium 4.8, serum creatinine 0.8, albumin 4.1, AST/ALT normal, Hgb 14.3, PLT 197  Past Medical History:  Diagnosis Date  . Anemia    in past  . CAD (coronary artery disease)    Cardiac catheterization done in July 2015 in [place] by Dr. Jori Higgins fields showed mild nonobstructive disease in the mid LAD and distal left circumflex with normal ejection fraction.  . Diabetes mellitus without complication ([place])   . Thyroid  disease     Past Surgical History:  Procedure Laterality Date  . COLONOSCOPY    . COLONOSCOPY WITH PROPOFOL N/A 09/20/2015   Procedure: COLONOSCOPY WITH PROPOFOL;  Surgeon: Travis Lame, MD;  Location: [place];  Service: Endoscopy;  Laterality: N/A;  . POLYPECTOMY  09/20/2015   Procedure: POLYPECTOMY INTESTINAL;  Surgeon: Travis Lame, MD;  Location: [place];  Service: Endoscopy;;  Sigmoid colon polyp x 1 Rectal polyp x 2    Current Medications: No outpatient medications have been marked as taking for the 03/03/19 encounter (Appointment) with Travis Mu, PA-C.    Allergies:   Levothyroxine and Lisinopril   Social History   Socioeconomic History  . Marital status: Married    Spouse name: Not on file  . Number of children: Not on file  . Years of education: Not on file  . Highest education level: Not on file  Occupational History  . Not on file  Social Needs  . Financial resource strain: Not on file  . Food insecurity    Worry: Not on file    Inability: Not on file  . Transportation needs    Medical: Not on file    Non-medical: Not on file  Tobacco Use  . Smoking status: Former Research scientist (life sciences)  . Smokeless tobacco: Never Used  . Tobacco comment: quit approx 2013  Substance and Sexual Activity  . Alcohol use: No  . Drug use: No  . Sexual activity: Not on file  Lifestyle  .  Physical activity    Days per week: Not on file    Minutes per session: Not on file  . Stress: Not on file  Relationships  . Social Herbalist on phone: Not on file    Gets together: Not on file    Attends religious service: Not on file    Active member of club or organization: Not on file    Attends meetings of clubs or organizations: Not on file    Relationship status: Not on file  Other Topics Concern  . Not on file  Social History Narrative  . Not on file     Family History:  The patient's family history is not on file.  ROS:   ROS   EKGs/Labs/Other  Studies Reviewed:    Studies reviewed were summarized above. The additional studies were reviewed today:  2D Echo 05/2017: - Left ventricle: The cavity size was normal. There was moderate   concentric hypertrophy. Systolic function was normal. The   estimated ejection fraction was in the range of 60% to 65%. Wall   motion was normal; there were no regional wall motion   abnormalities. Doppler parameters are consistent with abnormal   left ventricular relaxation (grade 1 diastolic dysfunction). - Aortic root: The aortic root was mildly dilated 3.7 cm - Ascending aorta: The ascending aorta was mildly dilated, 3.7 cm - Left atrium: The atrium was normal in size. - Right ventricle: Systolic function was normal.  Impressions:  - Sinus bradycardia rate 52 bpm. __________  Myoview 05/2017:  Blood pressure demonstrated a normal response to exercise.  T wave inversion was noted during stress in the V2, V3, V4 and V5 leads.  There was no ST segment deviation noted during stress.  Defect 1: There is a medium defect of severe severity present in the mid anteroseptal, mid anterolateral, apical septal, apical inferior and apex location. This is suggestive of prior infarct with significant peri-infarct ischemia. This seems to be in the distribution of distal LAD as well as an OM branch.  This is an intermediate risk study.  Nuclear stress EF: 49%.   EKG:  EKG is ordered today.  The EKG ordered today demonstrates ***  Recent Labs: 01/28/2019: ALT 31; BUN 11; Creatinine, Ser 0.80; Hemoglobin 14.3; Platelets 197; Potassium 4.8; Sodium 135; TSH 6.553  Recent Lipid Panel    Component Value Date/Time   CHOL 185 01/28/2019 0736   TRIG 84 01/28/2019 0736   HDL 47 01/28/2019 0736   CHOLHDL 3.9 01/28/2019 0736   VLDL 17 01/28/2019 0736   LDLCALC 121 (H) 01/28/2019 0736    PHYSICAL EXAM:    VS:  There were no vitals taken for this visit.  BMI: There is no height or weight on file to  calculate BMI.  Physical Exam  Wt Readings from Last 3 Encounters:  02/20/19 174 lb (78.9 kg)  01/27/19 176 lb (79.8 kg)  10/25/18 177 lb (80.3 kg)     ASSESSMENT & PLAN:   1. ***  Disposition: F/u with Dr. Fletcher Anon or an APP in ***.   Medication Adjustments/Labs and Tests Ordered: Current medicines are reviewed at length with the patient today.  Concerns regarding medicines are outlined above. Medication changes, Labs and Tests ordered today are summarized above and listed in the Patient Instructions accessible in Encounters.   Signed, Christell Faith, PA-C 02/28/2019 8:14 AM     [address] [place], [place] [address] [phone]

## 2019-03-03 ENCOUNTER — Ambulatory Visit: Payer: Medicare Other | Admitting: Physician Assistant

## 2019-03-03 DIAGNOSIS — M722 Plantar fascial fibromatosis: Secondary | ICD-10-CM | POA: Diagnosis not present

## 2019-03-06 DIAGNOSIS — M722 Plantar fascial fibromatosis: Secondary | ICD-10-CM | POA: Diagnosis not present

## 2019-03-13 ENCOUNTER — Ambulatory Visit: Payer: Medicare Other | Admitting: Cardiovascular Disease

## 2019-03-24 ENCOUNTER — Other Ambulatory Visit: Payer: Self-pay

## 2019-03-24 ENCOUNTER — Ambulatory Visit (INDEPENDENT_AMBULATORY_CARE_PROVIDER_SITE_OTHER): Payer: Medicare Other

## 2019-03-24 DIAGNOSIS — E538 Deficiency of other specified B group vitamins: Secondary | ICD-10-CM

## 2019-03-24 MED ORDER — CYANOCOBALAMIN 1000 MCG/ML IJ SOLN
1000.0000 ug | Freq: Once | INTRAMUSCULAR | Status: AC
  Administered 2019-03-24: 1000 ug via INTRAMUSCULAR

## 2019-03-26 DIAGNOSIS — M722 Plantar fascial fibromatosis: Secondary | ICD-10-CM | POA: Diagnosis not present

## 2019-03-28 ENCOUNTER — Other Ambulatory Visit: Payer: Self-pay

## 2019-03-28 ENCOUNTER — Ambulatory Visit (INDEPENDENT_AMBULATORY_CARE_PROVIDER_SITE_OTHER): Payer: Medicare Other | Admitting: Cardiovascular Disease

## 2019-03-28 ENCOUNTER — Encounter: Payer: Self-pay | Admitting: Cardiovascular Disease

## 2019-03-28 VITALS — BP 150/86 | HR 56 | Ht 65.0 in | Wt 175.0 lb

## 2019-03-28 DIAGNOSIS — I10 Essential (primary) hypertension: Secondary | ICD-10-CM

## 2019-03-28 DIAGNOSIS — E785 Hyperlipidemia, unspecified: Secondary | ICD-10-CM

## 2019-03-28 DIAGNOSIS — I2510 Atherosclerotic heart disease of native coronary artery without angina pectoris: Secondary | ICD-10-CM | POA: Diagnosis not present

## 2019-03-28 NOTE — Progress Notes (Signed)
Cardiology Office Note   Date:  03/28/2019   ID:  [name], [name] 1951-06-04, MRN [id number]  PCP:  Lavera Guise, MD  Cardiologist:   Kathlyn Sacramento, MD   Chief Complaint  Patient presents with  . other    6 month follow up. Meds reviewed by the pt. verbally. "doing well."       History of Present Illness: Travis Higgins is a 68 y.o. male who is here today for follow-up visit regarding abnormal stress test and presumed coronary artery disease. He had previous cardiac catheterization in 2015 in [place] which showed mild nonobstructive disease affecting the LAD and left circumflex.  No revascularization was required.  He has prolonged history of type 2 diabetes, essential hypertension, hyperlipidemia and hypothyroidism.  He is a previous smoker with no family history of coronary artery disease.   He is a retired Radio producer.   He was evaluated in 2018 for exertional dyspnea. He underwent a treadmill nuclear stress test which was intermediate risk study with evidence of prior anterior/anteroseptal infarct as well as apical inferior defect.  The distribution was consistent with a distal LAD as well as OM branch territory.  EF was 49%. Echocardiogram showed an EF of 60 to 65% with no significant valvular abnormalities.  Cardiac catheterization was recommended but was declined by the patient given that his previous cath in 2015 did not show obstructive disease.  He has been doing well with no recent chest pain, shortness of breath or palpitations.  He stopped taking rosuvastatin and Hyzaar on his own.   Past Medical History:  Diagnosis Date  . Anemia    in past  . CAD (coronary artery disease)    Cardiac catheterization done in July 2015 in [place] by Dr. Jori Moll fields showed mild nonobstructive disease in the mid LAD and distal left circumflex with normal ejection fraction.  . Diabetes mellitus without complication ([place])   . Thyroid disease     Past Surgical  History:  Procedure Laterality Date  . COLONOSCOPY    . COLONOSCOPY WITH PROPOFOL N/A 09/20/2015   Procedure: COLONOSCOPY WITH PROPOFOL;  Surgeon: Lucilla Lame, MD;  Location: [facility];  Service: Endoscopy;  Laterality: N/A;  . POLYPECTOMY  09/20/2015   Procedure: POLYPECTOMY INTESTINAL;  Surgeon: Lucilla Lame, MD;  Location: [facility];  Service: Endoscopy;;  Sigmoid colon polyp x 1 Rectal polyp x 2     Current Outpatient Medications  Medication Sig Dispense Refill  . glipiZIDE (GLUCOTROL) 5 MG tablet Take 1 tablet (5 mg total) by mouth 2 (two) times daily. 180 tablet 1  . levothyroxine (SYNTHROID) 75 MCG tablet Take 1 tablet (75 mcg total) by mouth daily before breakfast. 90 tablet 1   No current facility-administered medications for this visit.     Allergies:   Levothyroxine and Lisinopril    Social History:  The patient  reports that he has quit smoking. He has never used smokeless tobacco. He reports that he does not drink alcohol or use drugs.   Family History:  The patient's family history is negative for coronary artery disease.   ROS:  Please see the history of present illness.   Otherwise, review of systems are positive for none.   All other systems are reviewed and negative.    PHYSICAL EXAM: VS:  BP (!) 150/86 (BP Location: Left Arm, Patient Position: Sitting, Cuff Size: Normal)   Pulse (!) 56   Ht 5\' 5"  (1.651 m)   Wt 175 lb (  79.4 kg)   BMI 29.12 kg/m  , BMI Body mass index is 29.12 kg/m. GEN: Well nourished, well developed, in no acute distress  HEENT: normal  Neck: no JVD, carotid bruits, or masses Cardiac: RRR; no murmurs, rubs, or gallops,no edema  Respiratory:  clear to auscultation bilaterally, normal work of breathing GI: soft, nontender, nondistended, + BS MS: no deformity or atrophy  Skin: warm and dry, no rash Neuro:  Strength and sensation are intact Psych: euthymic mood, full affect   EKG:  EKG is ordered today. The ekg  ordered today demonstrates sinus bradycardia with right bundle branch block, evidence of prior inferior and anteroseptal infarcts.  Recent Labs: 01/28/2019: ALT 31; BUN 11; Creatinine, Ser 0.80; Hemoglobin 14.3; Platelets 197; Potassium 4.8; Sodium 135; TSH 6.553    Lipid Panel    Component Value Date/Time   CHOL 185 01/28/2019 0736   TRIG 84 01/28/2019 0736   HDL 47 01/28/2019 0736   CHOLHDL 3.9 01/28/2019 0736   VLDL 17 01/28/2019 0736   LDLCALC 121 (H) 01/28/2019 0736      Wt Readings from Last 3 Encounters:  03/28/19 175 lb (79.4 kg)  02/20/19 174 lb (78.9 kg)  01/27/19 176 lb (79.8 kg)       PAD Screen 05/24/2017  Previous PAD dx? No  Previous surgical procedure? No  Pain with walking? No  Feet/toe relief with dangling? No  Painful, non-healing ulcers? No  Extremities discolored? No      ASSESSMENT AND PLAN:  1.  Coronary artery disease involving native coronary arteries without angina: The patient is doing well overall with no anginal symptoms.  Continue medical therapy.  2.  Hyperlipidemia: Patient's lipid profile was well controlled on rosuvastatin but he stopped taking the medication and most recent lipid profile showed an LDL of 121.  I discussed with him the importance of taking a statin given that he is diabetic and he has underlying coronary artery disease.  The patient does not want to start the medication at the present time.  He reports that he wants to try with continued lifestyle changes.  That he has some financial difficulties as well but I explained to him that these medications are generic and should not be expensive.  He wants to revisit the issue next year.  3.  Essential hypertension: Blood pressure is elevated.  I suggested that we resume at least losartan alone but again he prefers not to be on medications for this at the present time.    Disposition:   FU with me in 12 months.   Signed,  Kathlyn Sacramento, MD  03/28/2019 3:54 PM    [place]

## 2019-03-28 NOTE — Patient Instructions (Signed)
Medication Instructions:  Your physician recommends that you continue on your current medications as directed. Please refer to the Current Medication list given to you today.  *If you need a refill on your cardiac medications before your next appointment, please call your pharmacy*  Lab Work: None ordered  If you have labs (blood work) drawn today and your tests are completely normal, you will receive your results only by: Marland Kitchen MyChart Message (if you have MyChart) OR . A paper copy in the mail If you have any lab test that is abnormal or we need to change your treatment, we will call you to review the results.  Testing/Procedures: None ordered   Follow-Up: At [facility], you and your health needs are our priority.  As part of our continuing mission to provide you with exceptional heart care, we have created designated Provider Care Teams.  These Care Teams include your primary Cardiologist (physician) and Advanced Practice Providers (APPs -  Physician Assistants and Nurse Practitioners) who all work together to provide you with the care you need, when you need it.  Your next appointment:   12 months  The format for your next appointment:   In Person  Provider:    You may see Kathlyn Sacramento, MD or one of the following Advanced Practice Providers on your designated Care Team:    Murray Hodgkins, NP  Christell Faith, PA-C  Marrianne Mood, PA-C

## 2019-04-04 DIAGNOSIS — E119 Type 2 diabetes mellitus without complications: Secondary | ICD-10-CM | POA: Diagnosis not present

## 2019-04-08 DIAGNOSIS — Z23 Encounter for immunization: Secondary | ICD-10-CM | POA: Diagnosis not present

## 2019-04-22 ENCOUNTER — Ambulatory Visit (INDEPENDENT_AMBULATORY_CARE_PROVIDER_SITE_OTHER): Payer: Medicare Other | Admitting: Nurse Practitioner

## 2019-04-22 ENCOUNTER — Other Ambulatory Visit: Payer: Self-pay

## 2019-04-22 ENCOUNTER — Encounter: Payer: Self-pay | Admitting: Nurse Practitioner

## 2019-04-22 VITALS — BP 129/76 | HR 54 | Temp 97.4°F | Resp 16 | Ht 65.0 in | Wt 175.6 lb

## 2019-04-22 DIAGNOSIS — E1165 Type 2 diabetes mellitus with hyperglycemia: Secondary | ICD-10-CM

## 2019-04-22 DIAGNOSIS — E538 Deficiency of other specified B group vitamins: Secondary | ICD-10-CM

## 2019-04-22 DIAGNOSIS — E782 Mixed hyperlipidemia: Secondary | ICD-10-CM

## 2019-04-22 DIAGNOSIS — I10 Essential (primary) hypertension: Secondary | ICD-10-CM | POA: Diagnosis not present

## 2019-04-22 LAB — POCT GLYCOSYLATED HEMOGLOBIN (HGB A1C): Hemoglobin A1C: 7.9 % — AB (ref 4.0–5.6)

## 2019-04-22 MED ORDER — RAMIPRIL 2.5 MG PO CAPS: 2.5000 mg | ORAL_CAPSULE | Freq: Every day | ORAL | 1 refills | Status: DC

## 2019-04-22 MED ORDER — CYANOCOBALAMIN 1000 MCG/ML IJ SOLN
1000.0000 ug | Freq: Once | INTRAMUSCULAR | Status: AC
  Administered 2019-04-22: 1000 ug via INTRAMUSCULAR

## 2019-04-22 NOTE — Progress Notes (Signed)
[facility] [place], [place] [address]  Internal MEDICINE  Office Visit Note  Patient Name: Travis Higgins  [id number]  [id number]  Date of Service: 05/09/2019  Chief Complaint  Patient presents with  . Diabetes  . Hypertension    pt states he is not taking losartan     The patient is here for follow up visit of diabetes. Blood sugars have been more elevated recently. His HgbA1c is 7.9 today. He states that he has been eating a bit more sweets recently. He is also not exercising as he should.  Blood pressure is well controlled. He has not been taking his losartan. Should be taking eith ACE or ARB to help protect kidneys since he is diabetic. He states that he is doing well. He has no concerns or complaints today. He is due to have B12 injection.       Current Medication: Outpatient Encounter Medications as of 04/22/2019  Medication Sig  . glipiZIDE (GLUCOTROL) 5 MG tablet Take 1 tablet (5 mg total) by mouth 2 (two) times daily.  Marland Kitchen levothyroxine (SYNTHROID) 75 MCG tablet Take 1 tablet (75 mcg total) by mouth daily before breakfast.  . rosuvastatin (CRESTOR) 10 MG tablet Take by mouth.  . ramipril (ALTACE) 2.5 MG capsule Take 1 capsule (2.5 mg total) by mouth daily.  . [DISCONTINUED] losartan (COZAAR) 25 MG tablet Take by mouth.  . [EXPIRED] cyanocobalamin ((VITAMIN B-12)) injection 1,000 mcg    No facility-administered encounter medications on file as of 04/22/2019.     Surgical History: Past Surgical History:  Procedure Laterality Date  . COLONOSCOPY    . COLONOSCOPY WITH PROPOFOL N/A 09/20/2015   Procedure: COLONOSCOPY WITH PROPOFOL;  Surgeon: Lucilla Lame, MD;  Location: [place];  Service: Endoscopy;  Laterality: N/A;  . POLYPECTOMY  09/20/2015   Procedure: POLYPECTOMY INTESTINAL;  Surgeon: Lucilla Lame, MD;  Location: [place];  Service: Endoscopy;;  Sigmoid colon polyp x 1 Rectal polyp x 2    Medical History: Past  Medical History:  Diagnosis Date  . Anemia    in past  . CAD (coronary artery disease)    Cardiac catheterization done in July 2015 in [place] by Dr. Jori Moll fields showed mild nonobstructive disease in the mid LAD and distal left circumflex with normal ejection fraction.  . Diabetes mellitus without complication ([place])   . Thyroid disease     Family History: History reviewed. No pertinent family history.  Social History   Socioeconomic History  . Marital status: Married    Spouse name: Not on file  . Number of children: Not on file  . Years of education: Not on file  . Highest education level: Not on file  Occupational History  . Not on file  Social Needs  . Financial resource strain: Not on file  . Food insecurity    Worry: Not on file    Inability: Not on file  . Transportation needs    Medical: Not on file    Non-medical: Not on file  Tobacco Use  . Smoking status: Former Research scientist (life sciences)  . Smokeless tobacco: Never Used  . Tobacco comment: quit approx 2013  Substance and Sexual Activity  . Alcohol use: No  . Drug use: No  . Sexual activity: Not on file  Lifestyle  . Physical activity    Days per week: Not on file    Minutes per session: Not on file  . Stress: Not on file  Relationships  . Social  connections    Talks on phone: Not on file    Gets together: Not on file    Attends religious service: Not on file    Active member of club or organization: Not on file    Attends meetings of clubs or organizations: Not on file    Relationship status: Not on file  . Intimate partner violence    Fear of current or ex partner: Not on file    Emotionally abused: Not on file    Physically abused: Not on file    Forced sexual activity: Not on file  Other Topics Concern  . Not on file  Social History Narrative  . Not on file      Review of Systems  Constitutional: Negative for activity change, chills, fatigue and unexpected weight change.  HENT: Negative for  congestion, postnasal drip, rhinorrhea, sneezing and sore throat.   Respiratory: Negative for cough, chest tightness, shortness of breath and wheezing.   Cardiovascular: Negative for chest pain and palpitations.  Gastrointestinal: Negative for abdominal pain, constipation, diarrhea, nausea and vomiting.  Endocrine: Negative for cold intolerance, heat intolerance, polydipsia and polyuria.       Blood sugars have been slightly more elevated recently.   Musculoskeletal: Negative for arthralgias, back pain, joint swelling and neck pain.  Skin: Negative for rash.  Allergic/Immunologic: Negative for environmental allergies.  Neurological: Negative for dizziness, tremors, numbness and headaches.  Hematological: Negative for adenopathy. Does not bruise/bleed easily.  Psychiatric/Behavioral: Negative for agitation, behavioral problems (Depression), confusion, sleep disturbance and suicidal ideas. The patient is not nervous/anxious.     Today's Vitals   04/22/19 1545  BP: 129/76  Pulse: (!) 54  Resp: 16  Temp: (!) 97.4 F (36.3 C)  SpO2: 99%  Weight: 175 lb 9.6 oz (79.7 kg)  Height: 5\' 5"  (1.651 m)   Body mass index is 29.22 kg/m.  Physical Exam Vitals signs and nursing note reviewed.  Constitutional:      General: He is not in acute distress.    Appearance: Normal appearance. He is well-developed. He is not diaphoretic.  HENT:     Head: Normocephalic and atraumatic.     Nose: Nose normal.     Mouth/Throat:     Pharynx: No oropharyngeal exudate.  Eyes:     Extraocular Movements: Extraocular movements intact.     Pupils: Pupils are equal, round, and reactive to light.  Neck:     Musculoskeletal: Normal range of motion and neck supple.     Thyroid: No thyromegaly.     Vascular: No JVD.     Trachea: No tracheal deviation.  Cardiovascular:     Rate and Rhythm: Normal rate and regular rhythm.     Heart sounds: Normal heart sounds. No murmur. No friction rub. No gallop.   Pulmonary:      Effort: Pulmonary effort is normal. No respiratory distress.     Breath sounds: Normal breath sounds. No wheezing or rales.  Chest:     Chest wall: No tenderness.  Abdominal:     Palpations: Abdomen is soft.  Musculoskeletal: Normal range of motion.  Lymphadenopathy:     Cervical: No cervical adenopathy.  Skin:    General: Skin is warm and dry.  Neurological:     Mental Status: He is alert and oriented to person, place, and time.     Cranial Nerves: No cranial nerve deficit.  Psychiatric:        Behavior: Behavior normal.  Thought Content: Thought content normal.        Judgment: Judgment normal.    Assessment/Plan: 1. Type 2 diabetes mellitus with hyperglycemia, without long-term current use of insulin (HCC) - POCT HgB A1C 7.9 today. No medication changes made today. Discussed importance of limiting sweets and sugar in the diet and incorporating exercise into daily routine to help lower blood sugars without changing medication. He voiced understanding.  - ramipril (ALTACE) 2.5 MG capsule; Take 1 capsule (2.5 mg total) by mouth daily.  Dispense: 90 capsule; Refill: 1  2. Essential hypertension Add rimipril 2.5mg  daily. Encouraged him to monitor blood pressure routinely. - ramipril (ALTACE) 2.5 MG capsule; Take 1 capsule (2.5 mg total) by mouth daily.  Dispense: 90 capsule; Refill: 1  3. Mixed hyperlipidemia Continue crestor as prescribed   4. B12 deficiency b12 injection administered today.  - cyanocobalamin ((VITAMIN B-12)) injection 1,000 mcg  General Counseling: Travis Higgins verbalizes understanding of the findings of todays visit and agrees with plan of treatment. I have discussed any further diagnostic evaluation that may be needed or ordered today. We also reviewed his medications today. he has been encouraged to call the office with any questions or concerns that should arise related to todays visit.  Diabetes Counseling:  1. Addition of ACE inh/ ARB'S for  nephroprotection. Microalbumin is updated  2. Diabetic foot care, prevention of complications. Podiatry consult 3. Exercise and lose weight.  4. Diabetic eye examination, Diabetic eye exam is updated  5. Monitor blood sugar closlely. nutrition counseling.  6. Sign and symptoms of hypoglycemia including shaking sweating,confusion and headaches.  This patient was seen by Leretha Pol FNP Collaboration with Dr Lavera Guise as a part of collaborative care agreement  Orders Placed This Encounter  Procedures  . POCT HgB A1C    Meds ordered this encounter  Medications  . ramipril (ALTACE) 2.5 MG capsule    Sig: Take 1 capsule (2.5 mg total) by mouth daily.    Dispense:  90 capsule    Refill:  1    Please fill as $4 list medication. Thanks.    Order Specific Question:   Supervising Provider    Answer:   Lavera Guise [id number]  . cyanocobalamin ((VITAMIN B-12)) injection 1,000 mcg    Time spent: 25 Minutes      Dr Lavera Guise Internal medicine

## 2019-07-21 ENCOUNTER — Other Ambulatory Visit: Payer: Self-pay

## 2019-07-21 DIAGNOSIS — E1165 Type 2 diabetes mellitus with hyperglycemia: Secondary | ICD-10-CM

## 2019-07-21 DIAGNOSIS — E039 Hypothyroidism, unspecified: Secondary | ICD-10-CM

## 2019-07-21 MED ORDER — GLIPIZIDE 5 MG PO TABS: 5.0000 mg | ORAL_TABLET | Freq: Two times a day (BID) | ORAL | 1 refills | Status: DC

## 2019-07-21 MED ORDER — LEVOTHYROXINE SODIUM 75 MCG PO TABS: 75.0000 ug | ORAL_TABLET | Freq: Every day | ORAL | 1 refills | Status: DC

## 2019-07-22 ENCOUNTER — Telehealth: Payer: Self-pay

## 2019-07-22 NOTE — Telephone Encounter (Signed)
CONFIRMED AND SCREENED FOR 07-24-19 OV. 

## 2019-07-24 ENCOUNTER — Other Ambulatory Visit: Payer: Self-pay

## 2019-07-24 ENCOUNTER — Ambulatory Visit: Payer: Medicare Other | Admitting: Nurse Practitioner

## 2019-07-24 DIAGNOSIS — Z23 Encounter for immunization: Secondary | ICD-10-CM | POA: Diagnosis not present

## 2019-07-25 ENCOUNTER — Encounter: Payer: Self-pay | Admitting: Nurse Practitioner

## 2019-07-25 ENCOUNTER — Ambulatory Visit (INDEPENDENT_AMBULATORY_CARE_PROVIDER_SITE_OTHER): Payer: Medicare Other | Admitting: Nurse Practitioner

## 2019-07-25 VITALS — BP 146/80 | HR 62 | Temp 97.2°F | Resp 16 | Ht 65.0 in | Wt 175.6 lb

## 2019-07-25 DIAGNOSIS — E039 Hypothyroidism, unspecified: Secondary | ICD-10-CM | POA: Diagnosis not present

## 2019-07-25 DIAGNOSIS — E1165 Type 2 diabetes mellitus with hyperglycemia: Secondary | ICD-10-CM | POA: Diagnosis not present

## 2019-07-25 DIAGNOSIS — I10 Essential (primary) hypertension: Secondary | ICD-10-CM | POA: Diagnosis not present

## 2019-07-25 LAB — POCT GLYCOSYLATED HEMOGLOBIN (HGB A1C): Hemoglobin A1C: 7.7 % — AB (ref 4.0–5.6)

## 2019-07-25 NOTE — Progress Notes (Signed)
[facility] [place], [place] [address]  Internal MEDICINE  Office Visit Note  Patient Name: Travis Higgins  [id number]  [id number]  Date of Service: 07/27/2019  Chief Complaint  Patient presents with  . Diabetes  . Hypertension  . Hypothyroidism  . Hyperlipidemia  . Follow-up    scratchy throat, coughing    The patient is here for routine follow up visit. He c/o feeling of something getting caught in his throat. Has to clear his throat often. Denies sore throat, headache, congestion, or other acute symptoms of illness. Blood sugars are a little better than at last check. HgbA1c is 7.7 down from 7.9 at his last check. His blood pressure is well managed.       Current Medication: Outpatient Encounter Medications as of 07/25/2019  Medication Sig  . glipiZIDE (GLUCOTROL) 5 MG tablet Take 1 tablet (5 mg total) by mouth 2 (two) times daily.  Marland Kitchen levothyroxine (SYNTHROID) 75 MCG tablet Take 1 tablet (75 mcg total) by mouth daily before breakfast.  . ramipril (ALTACE) 2.5 MG capsule Take 1 capsule (2.5 mg total) by mouth daily.  . rosuvastatin (CRESTOR) 10 MG tablet Take by mouth.   No facility-administered encounter medications on file as of 07/25/2019.    Surgical History: Past Surgical History:  Procedure Laterality Date  . COLONOSCOPY    . COLONOSCOPY WITH PROPOFOL N/A 09/20/2015   Procedure: COLONOSCOPY WITH PROPOFOL;  Surgeon: Lucilla Lame, MD;  Location: [place];  Service: Endoscopy;  Laterality: N/A;  . POLYPECTOMY  09/20/2015   Procedure: POLYPECTOMY INTESTINAL;  Surgeon: Lucilla Lame, MD;  Location: [place];  Service: Endoscopy;;  Sigmoid colon polyp x 1 Rectal polyp x 2    Medical History: Past Medical History:  Diagnosis Date  . Anemia    in past  . CAD (coronary artery disease)    Cardiac catheterization done in July 2015 in [place] by Dr. Jori Moll fields showed mild nonobstructive disease in the mid LAD and  distal left circumflex with normal ejection fraction.  . Diabetes mellitus without complication ([place])   . Thyroid disease     Family History: History reviewed. No pertinent family history.  Social History   Socioeconomic History  . Marital status: Married    Spouse name: Not on file  . Number of children: Not on file  . Years of education: Not on file  . Highest education level: Not on file  Occupational History  . Not on file  Tobacco Use  . Smoking status: Former Research scientist (life sciences)  . Smokeless tobacco: Never Used  . Tobacco comment: quit approx 2013  Substance and Sexual Activity  . Alcohol use: No  . Drug use: No  . Sexual activity: Not on file  Other Topics Concern  . Not on file  Social History Narrative  . Not on file   Social Determinants of Health   Financial Resource Strain:   . Difficulty of Paying Living Expenses: Not on file  Food Insecurity:   . Worried About Charity fundraiser in the Last Year: Not on file  . Ran Out of Food in the Last Year: Not on file  Transportation Needs:   . Lack of Transportation (Medical): Not on file  . Lack of Transportation (Non-Medical): Not on file  Physical Activity:   . Days of Exercise per Week: Not on file  . Minutes of Exercise per Session: Not on file  Stress:   . Feeling of Stress : Not on  file  Social Connections:   . Frequency of Communication with Friends and Family: Not on file  . Frequency of Social Gatherings with Friends and Family: Not on file  . Attends Religious Services: Not on file  . Active Member of Clubs or Organizations: Not on file  . Attends Archivist Meetings: Not on file  . Marital Status: Not on file  Intimate Partner Violence:   . Fear of Current or Ex-Partner: Not on file  . Emotionally Abused: Not on file  . Physically Abused: Not on file  . Sexually Abused: Not on file      Review of Systems  Constitutional: Negative for activity change, chills, fatigue and unexpected weight  change.  HENT: Negative for congestion, postnasal drip, rhinorrhea, sneezing and sore throat.        Frequent throat clearing.   Respiratory: Negative for cough, chest tightness, shortness of breath and wheezing.   Cardiovascular: Negative for chest pain and palpitations.  Gastrointestinal: Negative for abdominal pain, constipation, diarrhea, nausea and vomiting.  Endocrine: Negative for cold intolerance, heat intolerance, polydipsia and polyuria.       Slightly improved blood sugars over past few months   Musculoskeletal: Negative for arthralgias, back pain, joint swelling and neck pain.  Skin: Negative for rash.  Allergic/Immunologic: Negative for environmental allergies.  Neurological: Negative for dizziness, tremors, numbness and headaches.  Hematological: Negative for adenopathy. Does not bruise/bleed easily.  Psychiatric/Behavioral: Negative for agitation, behavioral problems (Depression), confusion, sleep disturbance and suicidal ideas. The patient is not nervous/anxious.     Today's Vitals   07/25/19 1143  BP: (!) 146/80  Pulse: 62  Resp: 16  Temp: (!) 97.2 F (36.2 C)  SpO2: 100%  Weight: 175 lb 9.6 oz (79.7 kg)  Height: 5\' 5"  (1.651 m)   Body mass index is 29.22 kg/m.  Physical Exam Vitals and nursing note reviewed.  Constitutional:      General: He is not in acute distress.    Appearance: Normal appearance. He is well-developed. He is not diaphoretic.  HENT:     Head: Normocephalic and atraumatic.     Nose: Nose normal.     Mouth/Throat:     Pharynx: No oropharyngeal exudate.  Eyes:     Extraocular Movements: Extraocular movements intact.     Pupils: Pupils are equal, round, and reactive to light.  Neck:     Thyroid: No thyromegaly.     Vascular: No JVD.     Trachea: No tracheal deviation.  Cardiovascular:     Rate and Rhythm: Normal rate and regular rhythm.     Heart sounds: Normal heart sounds. No murmur. No friction rub. No gallop.   Pulmonary:      Effort: Pulmonary effort is normal. No respiratory distress.     Breath sounds: Normal breath sounds. No wheezing or rales.  Chest:     Chest wall: No tenderness.  Abdominal:     Palpations: Abdomen is soft.  Musculoskeletal:        General: Normal range of motion.     Cervical back: Normal range of motion and neck supple.  Lymphadenopathy:     Cervical: No cervical adenopathy.  Skin:    General: Skin is warm and dry.  Neurological:     Mental Status: He is alert and oriented to person, place, and time.     Cranial Nerves: No cranial nerve deficit.  Psychiatric:        Mood and Affect: Mood normal.  Behavior: Behavior normal.        Thought Content: Thought content normal.        Judgment: Judgment normal.    Assessment/Plan: 1. Type 2 diabetes mellitus with hyperglycemia, without long-term current use of insulin (HCC) - POCT HgB A1C 7.7 today. Continue diabetic medication as prescribed. Reviewed diet and lifestyle changes associated with bringing down blood sugars wihtout changes in medication.   2. Acquired hypothyroidism Stable. Continue levothyroxine as prescribed   3. Essential hypertension Stable. Continue bp medication as prescribed   General Counseling: Cleve verbalizes understanding of the findings of todays visit and agrees with plan of treatment. I have discussed any further diagnostic evaluation that may be needed or ordered today. We also reviewed his medications today. he has been encouraged to call the office with any questions or concerns that should arise related to todays visit.    Orders Placed This Encounter  Procedures  . POCT HgB A1C    Diabetes Counseling:  1. Addition of ACE inh/ ARB'S for nephroprotection. Microalbumin is updated  2. Diabetic foot care, prevention of complications. Podiatry consult 3. Exercise and lose weight.  4. Diabetic eye examination, Diabetic eye exam is updated  5. Monitor blood sugar closlely. nutrition  counseling.  6. Sign and symptoms of hypoglycemia including shaking sweating,confusion and headaches.  This patient was seen by Leretha Pol FNP Collaboration with Dr Lavera Guise as a part of collaborative care agreement  Total time spent: 20 Minutes  Time spent includes review of chart, medications, test results, and follow up plan with the patient.      Dr Lavera Guise Internal medicine

## 2019-08-21 ENCOUNTER — Ambulatory Visit: Payer: Medicare Other | Admitting: Nurse Practitioner

## 2019-08-27 DIAGNOSIS — Z23 Encounter for immunization: Secondary | ICD-10-CM | POA: Diagnosis not present

## 2019-10-13 ENCOUNTER — Other Ambulatory Visit: Payer: Self-pay

## 2019-10-13 DIAGNOSIS — I10 Essential (primary) hypertension: Secondary | ICD-10-CM

## 2019-10-13 DIAGNOSIS — E1165 Type 2 diabetes mellitus with hyperglycemia: Secondary | ICD-10-CM

## 2019-10-13 MED ORDER — RAMIPRIL 2.5 MG PO CAPS: 2.5000 mg | ORAL_CAPSULE | Freq: Every day | ORAL | 1 refills | Status: DC

## 2019-11-19 ENCOUNTER — Telehealth: Payer: Self-pay

## 2019-11-19 NOTE — Telephone Encounter (Signed)
Confirmed appointment on 11/21/2019 and screened for covid. klh

## 2019-11-19 NOTE — Telephone Encounter (Signed)
Lmom to confirm and screen for 11-21-19 ov.

## 2019-11-21 ENCOUNTER — Encounter: Payer: Self-pay | Admitting: Nurse Practitioner

## 2019-11-21 ENCOUNTER — Other Ambulatory Visit: Payer: Self-pay

## 2019-11-21 ENCOUNTER — Ambulatory Visit (INDEPENDENT_AMBULATORY_CARE_PROVIDER_SITE_OTHER): Payer: Medicare Other | Admitting: Nurse Practitioner

## 2019-11-21 VITALS — BP 128/81 | HR 63 | Temp 97.4°F | Resp 16 | Ht 65.0 in | Wt 175.0 lb

## 2019-11-21 DIAGNOSIS — E039 Hypothyroidism, unspecified: Secondary | ICD-10-CM

## 2019-11-21 DIAGNOSIS — I10 Essential (primary) hypertension: Secondary | ICD-10-CM | POA: Diagnosis not present

## 2019-11-21 DIAGNOSIS — E1165 Type 2 diabetes mellitus with hyperglycemia: Secondary | ICD-10-CM

## 2019-11-21 DIAGNOSIS — E538 Deficiency of other specified B group vitamins: Secondary | ICD-10-CM | POA: Diagnosis not present

## 2019-11-21 LAB — POCT GLYCOSYLATED HEMOGLOBIN (HGB A1C): Hemoglobin A1C: 7.7 % — AB (ref 4.0–5.6)

## 2019-11-21 MED ORDER — CYANOCOBALAMIN 1000 MCG/ML IJ SOLN
1000.0000 ug | Freq: Once | INTRAMUSCULAR | Status: AC
  Administered 2019-11-21: 1000 ug via INTRAMUSCULAR

## 2019-11-21 NOTE — Progress Notes (Signed)
[facility] [place], [place] [address]  Internal MEDICINE  Office Visit Note  Patient Name: Travis Higgins  [id number]  [id number]  Date of Service: 12/03/2019  Chief Complaint  Patient presents with   Follow-up    throat hurts when talking alot   Diabetes    The patient is here for routine follow up. Blood pressure continues to be well managed. Blood sugars are stable, though just a little too high. His HgbA1c is 7.7 today and is unchanged from prior checks. States that he is doing well.  He has no concerns or complaints today. He is due to have B12 injection.       Current Medication: Outpatient Encounter Medications as of 11/21/2019  Medication Sig   glipiZIDE (GLUCOTROL) 5 MG tablet Take 1 tablet (5 mg total) by mouth 2 (two) times daily.   levothyroxine (SYNTHROID) 75 MCG tablet Take 1 tablet (75 mcg total) by mouth daily before breakfast.   ramipril (ALTACE) 2.5 MG capsule Take 1 capsule (2.5 mg total) by mouth daily.   rosuvastatin (CRESTOR) 10 MG tablet Take by mouth.   [EXPIRED] cyanocobalamin ((VITAMIN B-12)) injection 1,000 mcg    No facility-administered encounter medications on file as of 11/21/2019.    Surgical History: Past Surgical History:  Procedure Laterality Date   COLONOSCOPY     COLONOSCOPY WITH PROPOFOL N/A 09/20/2015   Procedure: COLONOSCOPY WITH PROPOFOL;  Surgeon: Lucilla Lame, MD;  Location: [place];  Service: Endoscopy;  Laterality: N/A;   POLYPECTOMY  09/20/2015   Procedure: POLYPECTOMY INTESTINAL;  Surgeon: Lucilla Lame, MD;  Location: [place];  Service: Endoscopy;;  Sigmoid colon polyp x 1 Rectal polyp x 2    Medical History: Past Medical History:  Diagnosis Date   Anemia    in past   CAD (coronary artery disease)    Cardiac catheterization done in July 2015 in [place] by Dr. Jori Moll fields showed mild nonobstructive disease in the mid LAD and distal left circumflex with  normal ejection fraction.   Diabetes mellitus without complication ([place])    Thyroid disease     Family History: History reviewed. No pertinent family history.  Social History   Socioeconomic History   Marital status: Married    Spouse name: Not on file   Number of children: Not on file   Years of education: Not on file   Highest education level: Not on file  Occupational History   Not on file  Tobacco Use   Smoking status: Former Smoker   Smokeless tobacco: Never Used   Tobacco comment: quit approx 2013  Vaping Use   Vaping Use: Never used  Substance and Sexual Activity   Alcohol use: No   Drug use: No   Sexual activity: Not on file  Other Topics Concern   Not on file  Social History Narrative   Not on file   Social Determinants of Health   Financial Resource Strain:    Difficulty of Paying Living Expenses:   Food Insecurity:    Worried About Charity fundraiser in the Last Year:    Arboriculturist in the Last Year:   Transportation Needs:    Film/video editor (Medical):    Lack of Transportation (Non-Medical):   Physical Activity:    Days of Exercise per Week:    Minutes of Exercise per Session:   Stress:    Feeling of Stress :   Social Connections:    Frequency of  Communication with Friends and Family:    Frequency of Social Gatherings with Friends and Family:    Attends Religious Services:    Active Member of Clubs or Organizations:    Attends Music therapist:    Marital Status:   Intimate Partner Violence:    Fear of Current or Ex-Partner:    Emotionally Abused:    Physically Abused:    Sexually Abused:       Review of Systems  Constitutional: Negative for activity change, chills, fatigue and unexpected weight change.  HENT: Negative for congestion, postnasal drip, rhinorrhea, sneezing and sore throat.        Still having frequent throat clearing.   Respiratory: Negative for cough, chest  tightness, shortness of breath and wheezing.   Cardiovascular: Negative for chest pain and palpitations.  Gastrointestinal: Negative for abdominal pain, constipation, diarrhea, nausea and vomiting.  Endocrine: Negative for cold intolerance, heat intolerance, polydipsia and polyuria.       Blood sugars stable.   Musculoskeletal: Negative for arthralgias, back pain, joint swelling and neck pain.  Skin: Negative for rash.  Allergic/Immunologic: Negative for environmental allergies.  Neurological: Negative for dizziness, tremors, numbness and headaches.  Hematological: Negative for adenopathy. Does not bruise/bleed easily.  Psychiatric/Behavioral: Negative for agitation, behavioral problems (Depression), confusion, sleep disturbance and suicidal ideas. The patient is not nervous/anxious.    Today's Vitals   11/21/19 1517  BP: 128/81  Pulse: 63  Resp: 16  Temp: (!) 97.4 F (36.3 C)  SpO2: 98%  Weight: 175 lb (79.4 kg)  Height: 5\' 5"  (1.651 m)   Body mass index is 29.12 kg/m.  Physical Exam Vitals and nursing note reviewed.  Constitutional:      General: He is not in acute distress.    Appearance: Normal appearance. He is well-developed. He is not diaphoretic.  HENT:     Head: Normocephalic and atraumatic.     Nose: Nose normal.     Mouth/Throat:     Pharynx: No oropharyngeal exudate.  Eyes:     Extraocular Movements: Extraocular movements intact.     Pupils: Pupils are equal, round, and reactive to light.  Neck:     Thyroid: No thyromegaly.     Vascular: No JVD.     Trachea: No tracheal deviation.  Cardiovascular:     Rate and Rhythm: Normal rate and regular rhythm.     Heart sounds: Normal heart sounds. No murmur heard.  No friction rub. No gallop.   Pulmonary:     Effort: Pulmonary effort is normal. No respiratory distress.     Breath sounds: Normal breath sounds. No wheezing or rales.  Chest:     Chest wall: No tenderness.  Abdominal:     Palpations: Abdomen is  soft.  Musculoskeletal:        General: Normal range of motion.     Cervical back: Normal range of motion and neck supple.  Lymphadenopathy:     Cervical: No cervical adenopathy.  Skin:    General: Skin is warm and dry.  Neurological:     Mental Status: He is alert and oriented to person, place, and time.     Cranial Nerves: No cranial nerve deficit.  Psychiatric:        Mood and Affect: Mood normal.        Behavior: Behavior normal.        Thought Content: Thought content normal.        Judgment: Judgment normal.    Assessment/Plan:  1. Type 2 diabetes mellitus with hyperglycemia, without long-term current use of insulin (HCC) - POCT HgB A1C 7.7 today. Continue diabetic medication as prescribed. Advised he limit intake of carbohydrates and sweets and increase protein in the diet.   2. Essential hypertension Stable. Continue bp medication as prescribed   3. Acquired hypothyroidism Check thyroid panel prior to next visit and adjust levothyroxine as indicated.   4. B12 deficiency b12 injection administered today.  - cyanocobalamin ((VITAMIN B-12)) injection 1,000 mcg  General Counseling: Jovin verbalizes understanding of the findings of todays visit and agrees with plan of treatment. I have discussed any further diagnostic evaluation that may be needed or ordered today. We also reviewed his medications today. he has been encouraged to call the office with any questions or concerns that should arise related to todays visit.  Diabetes Counseling:  1. Addition of ACE inh/ ARB'S for nephroprotection. Microalbumin is updated  2. Diabetic foot care, prevention of complications. Podiatry consult 3. Exercise and lose weight.  4. Diabetic eye examination, Diabetic eye exam is updated  5. Monitor blood sugar closlely. nutrition counseling.  6. Sign and symptoms of hypoglycemia including shaking sweating,confusion and headaches.  This patient was seen by [place] with Dr Lavera Guise as a part of collaborative care agreement  Orders Placed This Encounter  Procedures   POCT HgB A1C    Meds ordered this encounter  Medications   cyanocobalamin ((VITAMIN B-12)) injection 1,000 mcg    Total time spent: 30 Minutes   Time spent includes review of chart, medications, test results, and follow up plan with the patient.      Dr Lavera Guise Internal medicine

## 2019-12-10 DIAGNOSIS — F458 Other somatoform disorders: Secondary | ICD-10-CM | POA: Diagnosis not present

## 2019-12-10 DIAGNOSIS — K219 Gastro-esophageal reflux disease without esophagitis: Secondary | ICD-10-CM | POA: Diagnosis not present

## 2020-01-07 ENCOUNTER — Other Ambulatory Visit: Payer: Self-pay

## 2020-01-07 ENCOUNTER — Telehealth: Payer: Self-pay | Admitting: Nurse Practitioner

## 2020-01-07 ENCOUNTER — Other Ambulatory Visit: Payer: Self-pay | Admitting: Nurse Practitioner

## 2020-01-07 DIAGNOSIS — E1165 Type 2 diabetes mellitus with hyperglycemia: Secondary | ICD-10-CM

## 2020-01-07 DIAGNOSIS — K219 Gastro-esophageal reflux disease without esophagitis: Secondary | ICD-10-CM | POA: Diagnosis not present

## 2020-01-07 DIAGNOSIS — R490 Dysphonia: Secondary | ICD-10-CM | POA: Diagnosis not present

## 2020-01-07 MED ORDER — GLIPIZIDE 5 MG PO TABS: 5.0000 mg | ORAL_TABLET | Freq: Two times a day (BID) | ORAL | 1 refills | Status: DC

## 2020-01-07 NOTE — Telephone Encounter (Signed)
I just sent his glipizide to [facility].

## 2020-02-04 ENCOUNTER — Other Ambulatory Visit: Payer: Self-pay | Admitting: Physician Assistant

## 2020-02-04 DIAGNOSIS — R490 Dysphonia: Secondary | ICD-10-CM | POA: Diagnosis not present

## 2020-02-04 DIAGNOSIS — K219 Gastro-esophageal reflux disease without esophagitis: Secondary | ICD-10-CM | POA: Diagnosis not present

## 2020-02-04 DIAGNOSIS — F458 Other somatoform disorders: Secondary | ICD-10-CM | POA: Diagnosis not present

## 2020-02-09 ENCOUNTER — Ambulatory Visit (INDEPENDENT_AMBULATORY_CARE_PROVIDER_SITE_OTHER): Payer: Medicare Other | Admitting: Nurse Practitioner

## 2020-02-09 ENCOUNTER — Other Ambulatory Visit: Payer: Self-pay

## 2020-02-09 ENCOUNTER — Encounter: Payer: Self-pay | Admitting: Nurse Practitioner

## 2020-02-09 ENCOUNTER — Telehealth: Payer: Self-pay | Admitting: General Practice

## 2020-02-09 VITALS — BP 138/80 | HR 68 | Temp 97.8°F | Resp 16 | Ht 65.0 in | Wt 168.6 lb

## 2020-02-09 DIAGNOSIS — E1165 Type 2 diabetes mellitus with hyperglycemia: Secondary | ICD-10-CM

## 2020-02-09 DIAGNOSIS — R300 Dysuria: Secondary | ICD-10-CM

## 2020-02-09 DIAGNOSIS — I10 Essential (primary) hypertension: Secondary | ICD-10-CM

## 2020-02-09 DIAGNOSIS — Z0001 Encounter for general adult medical examination with abnormal findings: Secondary | ICD-10-CM | POA: Diagnosis not present

## 2020-02-09 DIAGNOSIS — M21611 Bunion of right foot: Secondary | ICD-10-CM

## 2020-02-09 DIAGNOSIS — E039 Hypothyroidism, unspecified: Secondary | ICD-10-CM

## 2020-02-09 LAB — POCT GLYCOSYLATED HEMOGLOBIN (HGB A1C): Hemoglobin A1C: 6.6 % — AB (ref 4.0–5.6)

## 2020-02-09 MED ORDER — LEVOTHYROXINE SODIUM 75 MCG PO TABS: 75.0000 ug | ORAL_TABLET | Freq: Every day | ORAL | 1 refills | Status: DC

## 2020-02-09 NOTE — Progress Notes (Signed)
[facility], [place] [address]  Internal MEDICINE  Office Visit Note  Patient Name: Travis Higgins  [id number]  [id number]  Date of Service: 02/22/2020   Pt is here for routine health maintenance examination  Chief Complaint  Patient presents with  . Medicare Wellness  . Diabetes     The patient presents for health maintenance exam. He states that he is doing well. Blood sugars have improved. His HgbA1c is 6.6, down from 7.7 at his most recent visit. He states that he has really been watching the intake of sweets and carbohydrates in the diet. He has been exercising more often. His blood pressure is currently well managed. He is due to have check of routine, fasting labs, including thyroid panel. This has been well managed on current dose of levothyroxine. He has no new concerns or complaints. He would like to have a new prescription for diabetic shoes. He has brought the order form with him today.     Current Medication: Outpatient Encounter Medications as of 02/09/2020  Medication Sig  . glipiZIDE (GLUCOTROL) 5 MG tablet Take 1 tablet (5 mg total) by mouth 2 (two) times daily.  Marland Kitchen levothyroxine (SYNTHROID) 75 MCG tablet Take 1 tablet (75 mcg total) by mouth daily before breakfast.  . ramipril (ALTACE) 2.5 MG capsule Take 1 capsule (2.5 mg total) by mouth daily.  . [DISCONTINUED] levothyroxine (SYNTHROID) 75 MCG tablet Take 1 tablet (75 mcg total) by mouth daily before breakfast.   No facility-administered encounter medications on file as of 02/09/2020.    Surgical History: Past Surgical History:  Procedure Laterality Date  . COLONOSCOPY    . COLONOSCOPY WITH PROPOFOL N/A 09/20/2015   Procedure: COLONOSCOPY WITH PROPOFOL;  Surgeon: Lucilla Lame, MD;  Location: [place];  Service: Endoscopy;  Laterality: N/A;  . POLYPECTOMY  09/20/2015   Procedure: POLYPECTOMY INTESTINAL;  Surgeon: Lucilla Lame, MD;  Location: [place];   Service: Endoscopy;;  Sigmoid colon polyp x 1 Rectal polyp x 2    Medical History: Past Medical History:  Diagnosis Date  . Anemia    in past  . CAD (coronary artery disease)    Cardiac catheterization done in July 2015 in [place] by Dr. Jori Moll fields showed mild nonobstructive disease in the mid LAD and distal left circumflex with normal ejection fraction.  . Diabetes mellitus without complication ([place])   . Hypertension   . Thyroid disease     Family History: History reviewed. No pertinent family history.    Review of Systems  Constitutional: Negative for activity change, chills, fatigue and unexpected weight change.  HENT: Negative for congestion, postnasal drip, rhinorrhea, sneezing and sore throat.   Respiratory: Negative for cough, chest tightness, shortness of breath and wheezing.   Cardiovascular: Negative for chest pain and palpitations.  Gastrointestinal: Negative for abdominal pain, constipation, diarrhea, nausea and vomiting.  Endocrine: Negative for cold intolerance, heat intolerance, polydipsia and polyuria.       Blood sugars are improving.   Genitourinary: Negative for dysuria, flank pain, hematuria and urgency.  Musculoskeletal: Negative for arthralgias, back pain, joint swelling and neck pain.  Skin: Negative for rash.  Allergic/Immunologic: Negative for environmental allergies.  Neurological: Negative for dizziness, tremors, numbness and headaches.  Hematological: Negative for adenopathy. Does not bruise/bleed easily.  Psychiatric/Behavioral: Negative for agitation, behavioral problems (Depression), confusion, sleep disturbance and suicidal ideas. The patient is not nervous/anxious.      Today's Vitals   02/09/20 1603  BP: 138/80  Pulse: 68  Resp: 16  Temp: 97.8 F (36.6 C)  SpO2: 97%  Weight: 168 lb 9.6 oz (76.5 kg)  Height: 5\' 5"  (1.651 m)   Body mass index is 28.06 kg/m.  Physical Exam Vitals and nursing note reviewed.  Constitutional:       General: He is not in acute distress.    Appearance: Normal appearance. He is well-developed. He is not diaphoretic.  HENT:     Head: Normocephalic and atraumatic.     Nose: Nose normal.     Mouth/Throat:     Pharynx: No oropharyngeal exudate.  Eyes:     Extraocular Movements: Extraocular movements intact.     Pupils: Pupils are equal, round, and reactive to light.  Neck:     Thyroid: No thyromegaly.     Vascular: No carotid bruit or JVD.     Trachea: No tracheal deviation.  Cardiovascular:     Rate and Rhythm: Normal rate and regular rhythm.     Pulses: Normal pulses.          Dorsalis pedis pulses are 2+ on the right side and 2+ on the left side.       Posterior tibial pulses are 2+ on the right side and 2+ on the left side.     Heart sounds: Normal heart sounds. No murmur heard.  No friction rub. No gallop.   Pulmonary:     Effort: Pulmonary effort is normal. No respiratory distress.     Breath sounds: Normal breath sounds. No wheezing or rales.  Chest:     Chest wall: No tenderness.  Abdominal:     General: Bowel sounds are normal.     Palpations: Abdomen is soft.     Tenderness: There is no abdominal tenderness.  Musculoskeletal:        General: Normal range of motion.     Cervical back: Normal range of motion and neck supple.     Right foot: Normal range of motion. Bunion present. No deformity.     Left foot: Normal range of motion. Bunion present. No deformity.       Feet:  Feet:     Right foot:     Protective Sensation: 10 sites tested. 10 sites sensed.     Skin integrity: Skin integrity normal.     Toenail Condition: Right toenails are normal.     Left foot:     Protective Sensation: 10 sites tested. 10 sites sensed.     Skin integrity: Skin integrity normal.     Toenail Condition: Left toenails are normal.     Comments:  Small bunions located at proximal joint of the great toes of both feet. Non tender. Does have mild pain with light palpation of dorsal  surface of both feet. Lymphadenopathy:     Cervical: No cervical adenopathy.  Skin:    General: Skin is warm and dry.  Neurological:     General: No focal deficit present.     Mental Status: He is alert and oriented to person, place, and time.     Cranial Nerves: No cranial nerve deficit.  Psychiatric:        Mood and Affect: Mood normal.        Behavior: Behavior normal.        Thought Content: Thought content normal.        Judgment: Judgment normal.    Depression screen [facility] 2/9 02/09/2020 07/25/2019 04/22/2019 01/27/2019 10/25/2018  Decreased Interest 0 0 0 0 0  Down, Depressed, Hopeless 0 0 0 0 0  PHQ - 2 Score 0 0 0 0 0    Functional Status Survey: Is the patient deaf or have difficulty hearing?: No Does the patient have difficulty seeing, even when wearing glasses/contacts?: No Does the patient have difficulty concentrating, remembering, or making decisions?: No Does the patient have difficulty walking or climbing stairs?: No Does the patient have difficulty dressing or bathing?: No Does the patient have difficulty doing errands alone such as visiting a doctor's office or shopping?: No  MMSE - [place] [facility] Exam 02/09/2020 01/22/2018  Orientation to time 5 5  Orientation to Place 5 5  Registration 3 3  Attention/ Calculation 5 5  Recall 3 3  Language- name 2 objects 2 2  Language- repeat 1 1  Language- follow 3 step command 3 3  Language- read & follow direction 1 1  Write a sentence 1 1  Copy design 1 1  Total score 30 30    Fall Risk  02/09/2020 07/25/2019 04/22/2019 01/27/2019 10/25/2018  Falls in the past year? 0 0 0 0 0  Comment - - - - -  Number falls in past yr: - - - - 0      LABS: Recent Results (from the past 2160 hour(s))  UA/M w/rflx Culture, Routine     Status: Abnormal   Collection Time: 02/09/20  4:16 PM   Specimen: Urine   Urine  Result Value Ref Range   Specific Gravity, UA 1.006 1.005 - 1.030   pH, UA 5.5 5.0 - 7.5   Color, UA Yellow  Yellow   Appearance Ur Clear Clear   Leukocytes,UA Negative Negative   Protein,UA Negative Negative/Trace   Glucose, UA Trace (A) Negative   Ketones, UA Negative Negative   RBC, UA Negative Negative   Bilirubin, UA Negative Negative   Urobilinogen, Ur 0.2 0.2 - 1.0 mg/dL   Nitrite, UA Negative Negative   Microscopic Examination Comment     Comment: Microscopic follows if indicated.   Microscopic Examination See below:     Comment: Microscopic was indicated and was performed.   Urinalysis Reflex Comment     Comment: This specimen will not reflex to a Urine Culture.  Microscopic Examination     Status: None   Collection Time: 02/09/20  4:16 PM   Urine  Result Value Ref Range   WBC, UA None seen 0 - 5 /hpf   RBC None seen 0 - 2 /hpf   Epithelial Cells (non renal) None seen 0 - 10 /hpf   Casts None seen None seen /lpf   Bacteria, UA None seen None seen/Few  POCT HgB A1C     Status: Abnormal   Collection Time: 02/09/20  4:18 PM  Result Value Ref Range   Hemoglobin A1C 6.6 (A) 4.0 - 5.6 %   HbA1c POC (<> result, manual entry)     HbA1c, POC (prediabetic range)     HbA1c, POC (controlled diabetic range)    CBC     Status: None   Collection Time: 02/14/20  7:39 AM  Result Value Ref Range   WBC 4.6 4.0 - 10.5 K/uL   RBC 4.93 4.22 - 5.81 MIL/uL   Hemoglobin 13.2 13.0 - 17.0 g/dL   HCT 41.5 39 - 52 %   MCV 84.2 80.0 - 100.0 fL   MCH 26.8 26.0 - 34.0 pg   MCHC 31.8 30.0 - 36.0 g/dL   RDW 13.9 11.5 - 15.5 %  Platelets 205 150 - 400 K/uL   nRBC 0.0 0.0 - 0.2 %    Comment: Performed at [facility], [address] [place], [place] [address]  Comprehensive metabolic panel     Status: Abnormal   Collection Time: 02/14/20  7:39 AM  Result Value Ref Range   Sodium 136 135 - 145 mmol/L   Potassium 4.3 3.5 - 5.1 mmol/L   Chloride 103 98 - 111 mmol/L   CO2 27 22 - 32 mmol/L   Glucose, Bld 67 (L) 70 - 99 mg/dL    Comment: Glucose reference range applies only to samples  taken after fasting for at least 8 hours.   BUN 14 8 - 23 mg/dL   Creatinine, Ser 0.90 0.61 - 1.24 mg/dL   Calcium 9.1 8.9 - 10.3 mg/dL   Total Protein 7.1 6.5 - 8.1 g/dL   Albumin 4.3 3.5 - 5.0 g/dL   AST 25 15 - 41 U/L   ALT 22 0 - 44 U/L   Alkaline Phosphatase 78 38 - 126 U/L   Total Bilirubin 0.9 0.3 - 1.2 mg/dL   GFR calc non Af Amer >60 >60 mL/min   GFR calc Af Amer >60 >60 mL/min   Anion gap 6 5 - 15    Comment: Performed at [facility], [address] [place], [place] [address]  Lipid panel     Status: Abnormal   Collection Time: 02/14/20  7:39 AM  Result Value Ref Range   Cholesterol 169 0 - 200 mg/dL   Triglycerides 93 <150 mg/dL   HDL 34 (L) >40 mg/dL   Total CHOL/HDL Ratio 5.0 RATIO   VLDL 19 0 - 40 mg/dL   LDL Cholesterol 116 (H) 0 - 99 mg/dL    Comment:        Total Cholesterol/HDL:CHD Risk Coronary Heart Disease Risk Table                     Men   Women  1/2 Average Risk   3.4   3.3  Average Risk       5.0   4.4  2 X Average Risk   9.6   7.1  3 X Average Risk  23.4   11.0        Use the calculated Patient Ratio above and the CHD Risk Table to determine the patient's CHD Risk.        ATP III CLASSIFICATION (LDL):  <100     mg/dL   Optimal  100-129  mg/dL   Near or Above                    Optimal  130-159  mg/dL   Borderline  160-189  mg/dL   High  >190     mg/dL   Very High Performed at [facility], [address] [place], [place] [address]   TSH     Status: None   Collection Time: 02/14/20  7:39 AM  Result Value Ref Range   TSH 2.498 0.350 - 4.500 uIU/mL    Comment: Performed by a 3rd Generation assay with a functional sensitivity of <=0.01 uIU/mL. Performed at [facility], [address] [place], [place] [address]   T4, free     Status: None   Collection Time: 02/14/20  7:39 AM  Result Value Ref Range   Free T4 1.05 0.61 - 1.12 ng/dL    Comment: (NOTE) Biotin ingestion may interfere  with free T4 tests. If  the results are inconsistent with the TSH level, previous test results, or the clinical presentation, then consider biotin interference. If needed, order repeat testing after stopping biotin. Performed at [facility], [address] [place], [place] [address]   PSA     Status: None   Collection Time: 02/14/20  7:39 AM  Result Value Ref Range   Prostatic Specific Antigen 0.27 0.00 - 4.00 ng/mL    Comment: (NOTE) While PSA levels of <=4.0 ng/ml are reported as reference range, some men with levels below 4.0 ng/ml can have prostate cancer and many men with PSA above 4.0 ng/ml do not have prostate cancer.  Other tests such as free PSA, age specific reference ranges, PSA velocity and PSA doubling time may be helpful especially in men less than 87 years old. Performed at [facility], [place] [address], [place], [place] [address]   Vitamin B12     Status: Abnormal   Collection Time: 02/14/20  7:39 AM  Result Value Ref Range   Vitamin B-12 164 (L) 180 - 914 pg/mL    Comment: (NOTE) This assay is not validated for testing neonatal or myeloproliferative syndrome specimens for Vitamin B12 levels. Performed at [facility], [place] [address] [place], [place] [address]   Folate, serum, performed at [facility] lab     Status: None   Collection Time: 02/14/20  7:39 AM  Result Value Ref Range   Folate 21.3 >5.9 ng/mL    Comment: Performed at [facility], [address] [place], [place] [address]  Iron and TIBC     Status: Abnormal   Collection Time: 02/14/20  7:39 AM  Result Value Ref Range   Iron 64 45 - 182 ug/dL   TIBC 456 (H) 250 - 450 ug/dL   Saturation Ratios 14 (L) 17.9 - 39.5 %   UIBC 392 ug/dL    Comment: Performed at [facility], [address], [place], [place] [address]    Assessment/Plan: 1. Encounter for general adult medical examination with abnormal findings Annual health maintenance exam today. Orders given to have routine,  fasting labs done.   2. Type 2 diabetes mellitus with hyperglycemia, without long-term current use of insulin (HCC) - POCT HgB A1C 6.6 today. Continue diabetic medication as prescribed. Continue to monitor blood sugars closely.   3. Bunion of great toe of right foot Order for diabetic shoes given for patient to take to DME provider.   4. Acquired hypothyroidism Check thyroid panel and adjust levothyroxine as indicated.  - levothyroxine (SYNTHROID) 75 MCG tablet; Take 1 tablet (75 mcg total) by mouth daily before breakfast.  Dispense: 90 tablet; Refill: 1  5. Essential hypertension bp stable. Continue bp medication as prescribed   6. Dysuria - UA/M w/rflx Culture, Routine  General Counseling: Knoah verbalizes understanding of the findings of todays visit and agrees with plan of treatment. I have discussed any further diagnostic evaluation that may be needed or ordered today. We also reviewed his medications today. he has been encouraged to call the office with any questions or concerns that should arise related to todays visit.    Counseling:  Diabetes Counseling:  1. Addition of ACE inh/ ARB'S for nephroprotection. Microalbumin is updated  2. Diabetic foot care, prevention of complications. Podiatry consult 3. Exercise and lose weight.  4. Diabetic eye examination, Diabetic eye exam is updated  5. Monitor blood sugar closlely. nutrition counseling.  6. Sign and  symptoms of hypoglycemia including shaking sweating,confusion and headaches.  This patient was seen by Leretha Pol FNP Collaboration with Dr Lavera Guise as a part of collaborative care agreement  Orders Placed This Encounter  Procedures  . Microscopic Examination  . UA/M w/rflx Culture, Routine  . POCT HgB A1C    Meds ordered this encounter  Medications  . levothyroxine (SYNTHROID) 75 MCG tablet    Sig: Take 1 tablet (75 mcg total) by mouth daily before breakfast.    Dispense:  90 tablet    Refill:  1     Please fill as 90 day prescription    Order Specific Question:   Supervising Provider    Answer:   Lavera Guise [[id number]]    Total time spent: 25 Minutes  Time spent includes review of chart, medications, test results, and follow up plan with the patient.     Lavera Guise, MD  Internal Medicine

## 2020-02-09 NOTE — Telephone Encounter (Signed)
LVM for patient to call our Office to schedule an appt. per Ginger

## 2020-02-10 LAB — UA/M W/RFLX CULTURE, ROUTINE
Bilirubin, UA: NEGATIVE
Ketones, UA: NEGATIVE
Leukocytes,UA: NEGATIVE
Nitrite, UA: NEGATIVE
Protein,UA: NEGATIVE
RBC, UA: NEGATIVE
Specific Gravity, UA: 1.006 (ref 1.005–1.030)
Urobilinogen, Ur: 0.2 mg/dL (ref 0.2–1.0)
pH, UA: 5.5 (ref 5.0–7.5)

## 2020-02-10 LAB — MICROSCOPIC EXAMINATION
Bacteria, UA: NONE SEEN
Casts: NONE SEEN /lpf
Epithelial Cells (non renal): NONE SEEN /hpf (ref 0–10)
RBC, Urine: NONE SEEN /hpf (ref 0–2)
WBC, UA: NONE SEEN /hpf (ref 0–5)

## 2020-02-11 ENCOUNTER — Telehealth: Payer: Self-pay | Admitting: General Practice

## 2020-02-11 NOTE — Telephone Encounter (Signed)
Called 2x to schedule appt per Ginger..Unable to contact patient..A letter was mailed out.

## 2020-02-14 ENCOUNTER — Other Ambulatory Visit
Admission: RE | Admit: 2020-02-14 | Discharge: 2020-02-14 | Disposition: A | Discharge status: Home / Self Care | Payer: Medicare Other | Attending: Nurse Practitioner | Admitting: Nurse Practitioner

## 2020-02-14 DIAGNOSIS — E039 Hypothyroidism, unspecified: Secondary | ICD-10-CM | POA: Diagnosis not present

## 2020-02-14 DIAGNOSIS — I10 Essential (primary) hypertension: Secondary | ICD-10-CM | POA: Insufficient documentation

## 2020-02-14 DIAGNOSIS — Z0000 Encounter for general adult medical examination without abnormal findings: Secondary | ICD-10-CM | POA: Insufficient documentation

## 2020-02-14 DIAGNOSIS — E538 Deficiency of other specified B group vitamins: Secondary | ICD-10-CM | POA: Insufficient documentation

## 2020-02-14 DIAGNOSIS — Z125 Encounter for screening for malignant neoplasm of prostate: Secondary | ICD-10-CM | POA: Diagnosis not present

## 2020-02-14 DIAGNOSIS — E119 Type 2 diabetes mellitus without complications: Secondary | ICD-10-CM | POA: Insufficient documentation

## 2020-02-14 LAB — CBC
HCT: 41.5 % (ref 39.0–52.0)
Hemoglobin: 13.2 g/dL (ref 13.0–17.0)
MCH: 26.8 pg (ref 26.0–34.0)
MCHC: 31.8 g/dL (ref 30.0–36.0)
MCV: 84.2 fL (ref 80.0–100.0)
Platelets: 205 10*3/uL (ref 150–400)
RBC: 4.93 MIL/uL (ref 4.22–5.81)
RDW: 13.9 % (ref 11.5–15.5)
WBC: 4.6 10*3/uL (ref 4.0–10.5)
nRBC: 0 % (ref 0.0–0.2)

## 2020-02-14 LAB — COMPREHENSIVE METABOLIC PANEL
ALT: 22 U/L (ref 0–44)
AST: 25 U/L (ref 15–41)
Albumin: 4.3 g/dL (ref 3.5–5.0)
Alkaline Phosphatase: 78 U/L (ref 38–126)
Anion gap: 6 (ref 5–15)
BUN: 14 mg/dL (ref 8–23)
CO2: 27 mmol/L (ref 22–32)
Calcium: 9.1 mg/dL (ref 8.9–10.3)
Chloride: 103 mmol/L (ref 98–111)
Creatinine, Ser: 0.9 mg/dL (ref 0.61–1.24)
GFR calc Af Amer: >60 mL/min (ref >60)
GFR calc non Af Amer: >60 mL/min (ref >60)
Glucose, Bld: 67 mg/dL — ABNORMAL LOW (ref 70–99)
Potassium: 4.3 mmol/L (ref 3.5–5.1)
Sodium: 136 mmol/L (ref 135–145)
Total Bilirubin: 0.9 mg/dL (ref 0.3–1.2)
Total Protein: 7.1 g/dL (ref 6.5–8.1)

## 2020-02-14 LAB — IRON AND TIBC
Iron: 64 ug/dL (ref 45–182)
Saturation Ratios: 14 % — ABNORMAL LOW (ref 17.9–39.5)
TIBC: 456 ug/dL — ABNORMAL HIGH (ref 250–450)
UIBC: 392 ug/dL

## 2020-02-14 LAB — LIPID PANEL
Cholesterol: 169 mg/dL (ref 0–200)
HDL: 34 mg/dL — ABNORMAL LOW (ref >40)
LDL Cholesterol: 116 mg/dL — ABNORMAL HIGH (ref 0–99)
Total CHOL/HDL Ratio: 5 RATIO
Triglycerides: 93 mg/dL (ref <150)
VLDL: 19 mg/dL (ref 0–40)

## 2020-02-14 LAB — VITAMIN B12: Vitamin B-12: 164 pg/mL — ABNORMAL LOW (ref 180–914)

## 2020-02-14 LAB — TSH: TSH: 2.498 u[IU]/mL (ref 0.350–4.500)

## 2020-02-14 LAB — T4, FREE: Free T4: 1.05 ng/dL (ref 0.61–1.12)

## 2020-02-14 LAB — FOLATE: Folate: 21.3 ng/mL (ref >5.9)

## 2020-02-14 LAB — PSA: Prostatic Specific Antigen: 0.27 ng/mL (ref 0.00–4.00)

## 2020-02-17 ENCOUNTER — Telehealth: Payer: Self-pay

## 2020-02-17 NOTE — Telephone Encounter (Signed)
Pt wife advised need follow up for labs

## 2020-02-18 ENCOUNTER — Ambulatory Visit
Admission: RE | Admit: 2020-02-18 | Discharge: 2020-02-18 | Disposition: A | Discharge status: Home / Self Care | Payer: Medicare Other | Source: Ambulatory Visit | Attending: Physician Assistant | Admitting: Physician Assistant

## 2020-02-18 ENCOUNTER — Other Ambulatory Visit: Payer: Self-pay

## 2020-02-18 DIAGNOSIS — I7090 Unspecified atherosclerosis: Secondary | ICD-10-CM | POA: Diagnosis not present

## 2020-02-18 DIAGNOSIS — F458 Other somatoform disorders: Secondary | ICD-10-CM | POA: Diagnosis not present

## 2020-02-18 DIAGNOSIS — I6521 Occlusion and stenosis of right carotid artery: Secondary | ICD-10-CM | POA: Diagnosis not present

## 2020-02-18 HISTORY — DX: Essential (primary) hypertension: I10

## 2020-02-18 MED ORDER — IOHEXOL 300 MG/ML  SOLN
80.0000 mL | Freq: Once | INTRAMUSCULAR | Status: AC | PRN
  Administered 2020-02-18: 80 mL via INTRAVENOUS

## 2020-02-25 DIAGNOSIS — R0683 Snoring: Secondary | ICD-10-CM | POA: Diagnosis not present

## 2020-02-25 DIAGNOSIS — K219 Gastro-esophageal reflux disease without esophagitis: Secondary | ICD-10-CM | POA: Diagnosis not present

## 2020-02-25 DIAGNOSIS — R490 Dysphonia: Secondary | ICD-10-CM | POA: Diagnosis not present

## 2020-03-08 ENCOUNTER — Telehealth: Payer: Self-pay

## 2020-03-08 NOTE — Telephone Encounter (Signed)
Diabetic shoe supply order form faxed to [facility] medical supply and placed in scan.

## 2020-03-11 ENCOUNTER — Other Ambulatory Visit: Payer: Self-pay

## 2020-03-11 ENCOUNTER — Encounter: Payer: Self-pay | Admitting: Hospice and Palliative Medicine

## 2020-03-11 ENCOUNTER — Ambulatory Visit (INDEPENDENT_AMBULATORY_CARE_PROVIDER_SITE_OTHER): Payer: Medicare Other | Admitting: Hospice and Palliative Medicine

## 2020-03-11 DIAGNOSIS — E039 Hypothyroidism, unspecified: Secondary | ICD-10-CM | POA: Diagnosis not present

## 2020-03-11 DIAGNOSIS — R7879 Finding of abnormal level of heavy metals in blood: Secondary | ICD-10-CM | POA: Diagnosis not present

## 2020-03-11 DIAGNOSIS — E538 Deficiency of other specified B group vitamins: Secondary | ICD-10-CM | POA: Diagnosis not present

## 2020-03-11 DIAGNOSIS — D519 Vitamin B12 deficiency anemia, unspecified: Secondary | ICD-10-CM | POA: Diagnosis not present

## 2020-03-11 DIAGNOSIS — E1165 Type 2 diabetes mellitus with hyperglycemia: Secondary | ICD-10-CM | POA: Diagnosis not present

## 2020-03-11 DIAGNOSIS — R790 Abnormal level of blood mineral: Secondary | ICD-10-CM

## 2020-03-11 MED ORDER — CYANOCOBALAMIN 1000 MCG/ML IJ SOLN
1000.0000 ug | Freq: Once | INTRAMUSCULAR | Status: AC
  Administered 2020-03-11: 1000 ug via INTRAMUSCULAR

## 2020-03-11 NOTE — Progress Notes (Signed)
[facility] [place], [place] [address]  Internal MEDICINE  Office Visit Note  Patient Name: Travis Higgins  [id number]  [id number]  Date of Service: 03/12/2020  Chief Complaint  Patient presents with  . Follow-up    lab results, pt recieved policy update form  . Hypertension    HPI Patient is here for routine follow-up We discussed his recent labs today His glucose on the day he had labs was 95, he does not recall having symptoms of hypoglycemia Elevated LDL and low HDL TIBC elevated, low saturation ratios Low B12 He remains very active walks about 2 miles per day and has been eating healthy, following his wife and intermittent fasting   Current Medication: Outpatient Encounter Medications as of 03/11/2020  Medication Sig  . glipiZIDE (GLUCOTROL) 5 MG tablet Take 1 tablet (5 mg total) by mouth 2 (two) times daily.  Marland Kitchen levothyroxine (SYNTHROID) 75 MCG tablet Take 1 tablet (75 mcg total) by mouth daily before breakfast.  . ramipril (ALTACE) 2.5 MG capsule Take 1 capsule (2.5 mg total) by mouth daily.  . [EXPIRED] cyanocobalamin ((VITAMIN B-12)) injection 1,000 mcg    No facility-administered encounter medications on file as of 03/11/2020.    Surgical History: Past Surgical History:  Procedure Laterality Date  . COLONOSCOPY    . COLONOSCOPY WITH PROPOFOL N/A 09/20/2015   Procedure: COLONOSCOPY WITH PROPOFOL;  Surgeon: Lucilla Lame, MD;  Location: [place];  Service: Endoscopy;  Laterality: N/A;  . POLYPECTOMY  09/20/2015   Procedure: POLYPECTOMY INTESTINAL;  Surgeon: Lucilla Lame, MD;  Location: [place];  Service: Endoscopy;;  Sigmoid colon polyp x 1 Rectal polyp x 2    Medical History: Past Medical History:  Diagnosis Date  . Anemia    in past  . CAD (coronary artery disease)    Cardiac catheterization done in July 2015 in [place] by Dr. Jori Moll fields showed mild nonobstructive disease in the mid LAD and distal left  circumflex with normal ejection fraction.  . Diabetes mellitus without complication ([place])   . Hypertension   . Thyroid disease     Family History: History reviewed. No pertinent family history.  Social History   Socioeconomic History  . Marital status: Married    Spouse name: Not on file  . Number of children: Not on file  . Years of education: Not on file  . Highest education level: Not on file  Occupational History  . Not on file  Tobacco Use  . Smoking status: Former Research scientist (life sciences)  . Smokeless tobacco: Never Used  . Tobacco comment: quit approx 2013  Vaping Use  . Vaping Use: Never used  Substance and Sexual Activity  . Alcohol use: No  . Drug use: No  . Sexual activity: Not on file  Other Topics Concern  . Not on file  Social History Narrative  . Not on file   Social Determinants of Health   Financial Resource Strain:   . Difficulty of Paying Living Expenses: Not on file  Food Insecurity:   . Worried About Charity fundraiser in the Last Year: Not on file  . Ran Out of Food in the Last Year: Not on file  Transportation Needs:   . Lack of Transportation (Medical): Not on file  . Lack of Transportation (Non-Medical): Not on file  Physical Activity:   . Days of Exercise per Week: Not on file  . Minutes of Exercise per Session: Not on file  Stress:   .  Feeling of Stress : Not on file  Social Connections:   . Frequency of Communication with Friends and Family: Not on file  . Frequency of Social Gatherings with Friends and Family: Not on file  . Attends Religious Services: Not on file  . Active Member of Clubs or Organizations: Not on file  . Attends Archivist Meetings: Not on file  . Marital Status: Not on file  Intimate Partner Violence:   . Fear of Current or Ex-Partner: Not on file  . Emotionally Abused: Not on file  . Physically Abused: Not on file  . Sexually Abused: Not on file      Review of Systems  Constitutional: Negative for chills,  diaphoresis, fatigue and unexpected weight change.  HENT: Negative for congestion, postnasal drip, rhinorrhea, sneezing and sore throat.   Eyes: Negative for photophobia, redness and visual disturbance.  Respiratory: Negative for cough, chest tightness and shortness of breath.   Cardiovascular: Negative for chest pain, palpitations and leg swelling.  Gastrointestinal: Negative for abdominal pain, constipation, diarrhea, nausea and vomiting.  Genitourinary: Negative for dysuria and frequency.  Musculoskeletal: Negative for arthralgias, back pain, joint swelling and neck pain.  Skin: Negative for color change and rash.  Neurological: Negative for tremors, numbness and headaches.  Hematological: Negative for adenopathy. Does not bruise/bleed easily.  Psychiatric/Behavioral: Negative for behavioral problems (Depression), sleep disturbance and suicidal ideas. The patient is not nervous/anxious.     Vital Signs: BP 134/70   Pulse 63   Temp (!) 97.3 F (36.3 C)   Resp 16   Ht 5\' 5"  (1.651 m)   Wt 167 lb 12.8 oz (76.1 kg)   SpO2 94%   BMI 27.92 kg/m    Physical Exam Vitals reviewed.  Constitutional:      Appearance: Normal appearance. He is normal weight.  HENT:     Mouth/Throat:     Mouth: Mucous membranes are moist.     Pharynx: Oropharynx is clear.  Cardiovascular:     Rate and Rhythm: Normal rate and regular rhythm.     Pulses: Normal pulses.     Heart sounds: Normal heart sounds.  Pulmonary:     Effort: Pulmonary effort is normal.     Breath sounds: Normal breath sounds.  Abdominal:     General: Abdomen is flat.     Palpations: Abdomen is soft.  Musculoskeletal:        General: Normal range of motion.     Cervical back: Normal range of motion.  Skin:    General: Skin is warm.  Neurological:     General: No focal deficit present.     Mental Status: He is alert and oriented to person, place, and time. Mental status is at baseline.  Psychiatric:        Mood and Affect:  Mood normal.        Behavior: Behavior normal.        Thought Content: Thought content normal.    Assessment/Plan: 1. Abnormal result of iron profile testing At this time we will repeat testing in 3 months for review. At that time may require further work-up or appropriate referral. - Folate - Iron and TIBC - Ferritin  2. Vitamin B12 deficiency anemia, unspecified  Injection given today, will set up for weekly injections followed by monthly. - cyanocobalamin ((VITAMIN B-12)) injection 1,000 mcg  3. Type 2 diabetes mellitus with hyperglycemia, without long-term current use of insulin (HCC) Hypoglycemia on day of labs, advised to closely monitor  his glucose levels at home and to report persistent episodes of hypoglycemia, last A1C stable at 6.6 due for recheck in November.  5. Acquired hypothyroidism Thyroid labs stable, continue on current dose of levothyroxine and routine monitoring.  Lab slip given to have drawn at hospital  General Counseling: [place] understanding of the findings of todays visit and agrees with plan of treatment. I have discussed any further diagnostic evaluation that may be needed or ordered today. We also reviewed his medications today. he has been encouraged to call the office with any questions or concerns that should arise related to todays visit.    Orders Placed This Encounter  Procedures  . Vitamin B12  . Folate  . Iron and TIBC  . Ferritin    Meds ordered this encounter  Medications  . cyanocobalamin ((VITAMIN B-12)) injection 1,000 mcg    Time spent: 30 Minutes Time spent includes review of chart, medications, test results and follow-up plan with the patient.  This patient was seen by Theodoro Grist AGNP-C in Collaboration with Dr Lavera Guise as a part of collaborative care agreement     Tanna Furry. Blanca Carreon AGNP-C Internal medicine

## 2020-03-12 ENCOUNTER — Encounter: Payer: Self-pay | Admitting: Hospice and Palliative Medicine

## 2020-03-12 DIAGNOSIS — G4733 Obstructive sleep apnea (adult) (pediatric): Secondary | ICD-10-CM | POA: Diagnosis not present

## 2020-03-25 ENCOUNTER — Ambulatory Visit (INDEPENDENT_AMBULATORY_CARE_PROVIDER_SITE_OTHER): Payer: Medicare Other | Admitting: Vascular Surgery

## 2020-03-25 ENCOUNTER — Other Ambulatory Visit: Payer: Self-pay

## 2020-03-25 ENCOUNTER — Encounter (INDEPENDENT_AMBULATORY_CARE_PROVIDER_SITE_OTHER): Payer: Self-pay | Admitting: Vascular Surgery

## 2020-03-25 VITALS — BP 122/75 | HR 51 | Ht 65.0 in | Wt 165.0 lb

## 2020-03-25 DIAGNOSIS — M1711 Unilateral primary osteoarthritis, right knee: Secondary | ICD-10-CM | POA: Diagnosis not present

## 2020-03-25 DIAGNOSIS — I10 Essential (primary) hypertension: Secondary | ICD-10-CM

## 2020-03-25 DIAGNOSIS — I6523 Occlusion and stenosis of bilateral carotid arteries: Secondary | ICD-10-CM

## 2020-03-25 DIAGNOSIS — E782 Mixed hyperlipidemia: Secondary | ICD-10-CM | POA: Diagnosis not present

## 2020-03-25 DIAGNOSIS — E1142 Type 2 diabetes mellitus with diabetic polyneuropathy: Secondary | ICD-10-CM

## 2020-03-26 ENCOUNTER — Encounter (INDEPENDENT_AMBULATORY_CARE_PROVIDER_SITE_OTHER): Payer: Self-pay | Admitting: Vascular Surgery

## 2020-03-26 DIAGNOSIS — I6529 Occlusion and stenosis of unspecified carotid artery: Secondary | ICD-10-CM | POA: Insufficient documentation

## 2020-03-26 NOTE — Progress Notes (Signed)
MRN : [id number]  Travis Higgins is a 69 y.o. (23-Jul-1950) male who presents with chief complaint of  Chief Complaint  Patient presents with   New Patient (Initial Visit)    Carotid stenosis   .  History of Present Illness:   The patient is seen for evaluation of carotid stenosis. The carotid stenosis was identified after a CT scan of the neck was obtained by ENT.  The patient denies amaurosis fugax. There is no recent history of TIA symptoms or focal motor deficits. There is no prior documented CVA.  There is no history of migraine headaches. There is no history of seizures.  The patient is taking enteric-coated aspirin 81 mg daily.  The patient has a history of coronary artery disease, no recent episodes of angina or shortness of breath. The patient denies PAD or claudication symptoms. There is a history of hyperlipidemia which is being treated with a statin.   CT of the neck Dated February 18, 2020 is reviewed by me.  It is also shown to the patient and his wife.  Although the radiologist read is 70% for the right internal carotid artery by my measurement is closer to 55 to 60%.  Left side is less than 50%.    Current Meds  Medication Sig   glipiZIDE (GLUCOTROL) 5 MG tablet Take 1 tablet (5 mg total) by mouth 2 (two) times daily.   levothyroxine (SYNTHROID) 75 MCG tablet Take 1 tablet (75 mcg total) by mouth daily before breakfast.   omeprazole (PRILOSEC) 40 MG capsule Take 40 mg by mouth at bedtime.   ramipril (ALTACE) 2.5 MG capsule Take 1 capsule (2.5 mg total) by mouth daily.    Past Medical History:  Diagnosis Date   Anemia    in past   CAD (coronary artery disease)    Cardiac catheterization done in July 2015 in [place] by Dr. Jori Moll fields showed mild nonobstructive disease in the mid LAD and distal left circumflex with normal ejection fraction.   Diabetes mellitus without complication ([place])    Hypertension    Thyroid disease     Past  Surgical History:  Procedure Laterality Date   COLONOSCOPY     COLONOSCOPY WITH PROPOFOL N/A 09/20/2015   Procedure: COLONOSCOPY WITH PROPOFOL;  Surgeon: Lucilla Lame, MD;  Location: [place];  Service: Endoscopy;  Laterality: N/A;   POLYPECTOMY  09/20/2015   Procedure: POLYPECTOMY INTESTINAL;  Surgeon: Lucilla Lame, MD;  Location: [place];  Service: Endoscopy;;  Sigmoid colon polyp x 1 Rectal polyp x 2    Social History Social History   Tobacco Use   Smoking status: Former Smoker   Smokeless tobacco: Never Used   Tobacco comment: quit approx 2013  Vaping Use   Vaping Use: Never used  Substance Use Topics   Alcohol use: No   Drug use: No    Family History No family history of bleeding/clotting disorders, porphyria or autoimmune disease   Allergies  Allergen Reactions   Levothyroxine      REVIEW OF SYSTEMS (Negative unless checked)  Constitutional: [] Weight loss  [] Fever  [] Chills Cardiac: [] Chest pain   [] Chest pressure   [] Palpitations   [] Shortness of breath when laying flat   [] Shortness of breath with exertion. Vascular:  [] Pain in legs with walking   [] Pain in legs at rest  [] History of DVT   [] Phlebitis   [] Swelling in legs   [] Varicose veins   [] Non-healing ulcers Pulmonary:   [] Uses home oxygen   []   Productive cough   [] Hemoptysis   [] Wheeze  [] COPD   [] Asthma Neurologic:  [] Dizziness   [] Seizures   [] History of stroke   [] History of TIA  [] Aphasia   [] Vissual changes   [] Weakness or numbness in arm   [] Weakness or numbness in leg Musculoskeletal:   [] Joint swelling   [x] Joint pain   [] Low back pain Hematologic:  [] Easy bruising  [] Easy bleeding   [] Hypercoagulable state   [] Anemic Gastrointestinal:  [] Diarrhea   [] Vomiting  [] Gastroesophageal reflux/heartburn   [] Difficulty swallowing. Genitourinary:  [] Chronic kidney disease   [] Difficult urination  [] Frequent urination   [] Blood in urine Skin:  [] Rashes   [] Ulcers  Psychological:   [] History of anxiety   []  History of major depression.  Physical Examination  Vitals:   03/25/20 1545  BP: 122/75  Pulse: (!) 51  Weight: 165 lb (74.8 kg)  Height: 5\' 5"  (1.651 m)   Body mass index is 27.46 kg/m. Gen: WD/WN, NAD Head: [place]/AT, No temporalis wasting.  Ear/Nose/Throat: Hearing grossly intact, nares w/o erythema or drainage, poor dentition Eyes: PER, EOMI, sclera nonicteric.  Neck: Supple, no masses.  No bruit or JVD.  Pulmonary:  Good air movement, clear to auscultation bilaterally, no use of accessory muscles.  Cardiac: RRR, normal S1, S2, no Murmurs. Vascular: no carotid bruits noted Vessel Right Left  Radial Palpable Palpable  Brachial Palpable Palpable  Carotid Palpable Palpable  Gastrointestinal: soft, non-distended. No guarding/no peritoneal signs.  Musculoskeletal: M/S 5/5 throughout.  No deformity or atrophy.  Neurologic: CN 2-12 intact. Pain and light touch intact in extremities.  Symmetrical.  Speech is fluent. Motor exam as listed above. Psychiatric: Judgment intact, Mood & affect appropriate for pt's clinical situation. Dermatologic: No rashes or ulcers noted.  No changes consistent with cellulitis.  CBC Lab Results  Component Value Date   WBC 4.6 02/14/2020   HGB 13.2 02/14/2020   HCT 41.5 02/14/2020   MCV 84.2 02/14/2020   PLT 205 02/14/2020    BMET    Component Value Date/Time   NA 136 02/14/2020 0739   K 4.3 02/14/2020 0739   CL 103 02/14/2020 0739   CO2 27 02/14/2020 0739   GLUCOSE 67 (L) 02/14/2020 0739   BUN 14 02/14/2020 0739   CREATININE 0.90 02/14/2020 0739   CALCIUM 9.1 02/14/2020 0739   GFRNONAA >60 02/14/2020 0739   GFRAA >60 02/14/2020 0739   CrCl cannot be calculated (Patient's most recent lab result is older than the maximum 21 days allowed.).  COAG No results found for: INR, PROTIME  Radiology No results found.   Assessment/Plan 1. Bilateral carotid artery stenosis Recommend:  Given the patient's  asymptomatic subcritical stenosis no further invasive testing or surgery at this time.  CT of the neck shows about 60% right internal carotid artery stenosis and less than 50% left internal carotid artery stenosis bilaterally.  Continue antiplatelet therapy as prescribed Continue management of CAD, HTN and Hyperlipidemia Healthy heart diet,  encouraged exercise at least 4 times per week Follow up in 6 months with duplex ultrasound and physical exam  - VAS US CAROTID; Future  2. Essential hypertension Continue antihypertensive medications as already ordered, these medications have been reviewed and there are no changes at this time.   3. Type 2 diabetes, controlled, with peripheral neuropathy ([place]) Continue hypoglycemic medications as already ordered, these medications have been reviewed and there are no changes at this time.  Hgb A1C to be monitored as already arranged by primary service   4.  Mixed hyperlipidemia Continue statin as ordered and reviewed, no changes at this time   5. Primary osteoarthritis of right knee Continue NSAID medications as already ordered, these medications have been reviewed and there are no changes at this time.  Continued activity and therapy was stressed.     Hortencia Pilar, MD  03/26/2020 10:28 AM

## 2020-04-01 DIAGNOSIS — Z23 Encounter for immunization: Secondary | ICD-10-CM | POA: Diagnosis not present

## 2020-04-02 ENCOUNTER — Other Ambulatory Visit: Payer: Self-pay

## 2020-04-02 DIAGNOSIS — E1165 Type 2 diabetes mellitus with hyperglycemia: Secondary | ICD-10-CM

## 2020-04-02 DIAGNOSIS — I10 Essential (primary) hypertension: Secondary | ICD-10-CM

## 2020-04-02 MED ORDER — RAMIPRIL 2.5 MG PO CAPS: 2.5000 mg | ORAL_CAPSULE | Freq: Every day | ORAL | 1 refills | Status: DC

## 2020-04-10 DIAGNOSIS — Z23 Encounter for immunization: Secondary | ICD-10-CM | POA: Diagnosis not present

## 2020-04-12 DIAGNOSIS — K219 Gastro-esophageal reflux disease without esophagitis: Secondary | ICD-10-CM | POA: Diagnosis not present

## 2020-04-12 DIAGNOSIS — R490 Dysphonia: Secondary | ICD-10-CM | POA: Diagnosis not present

## 2020-04-12 DIAGNOSIS — G4733 Obstructive sleep apnea (adult) (pediatric): Secondary | ICD-10-CM | POA: Diagnosis not present

## 2020-04-20 ENCOUNTER — Other Ambulatory Visit: Payer: Self-pay

## 2020-04-20 ENCOUNTER — Ambulatory Visit (INDEPENDENT_AMBULATORY_CARE_PROVIDER_SITE_OTHER): Payer: Medicare Other | Admitting: Cardiovascular Disease

## 2020-04-20 ENCOUNTER — Encounter: Payer: Self-pay | Admitting: Cardiovascular Disease

## 2020-04-20 VITALS — BP 124/60 | HR 50 | Ht 65.0 in | Wt 166.1 lb

## 2020-04-20 DIAGNOSIS — I2510 Atherosclerotic heart disease of native coronary artery without angina pectoris: Secondary | ICD-10-CM

## 2020-04-20 DIAGNOSIS — E785 Hyperlipidemia, unspecified: Secondary | ICD-10-CM | POA: Diagnosis not present

## 2020-04-20 DIAGNOSIS — I779 Disorder of arteries and arterioles, unspecified: Secondary | ICD-10-CM | POA: Diagnosis not present

## 2020-04-20 DIAGNOSIS — I6523 Occlusion and stenosis of bilateral carotid arteries: Secondary | ICD-10-CM | POA: Diagnosis not present

## 2020-04-20 DIAGNOSIS — I10 Essential (primary) hypertension: Secondary | ICD-10-CM

## 2020-04-20 MED ORDER — EZETIMIBE 10 MG PO TABS: 10.0000 mg | ORAL_TABLET | Freq: Every day | ORAL | 3 refills | Status: DC

## 2020-04-20 NOTE — Patient Instructions (Signed)
Medication Instructions:  Your physician has recommended you make the following change in your medication:   START Zetia 10 mg daily. An Rx has been sent to [facility].  Please take the Good Rx print out with you to the pharmacy.  *If you need a refill on your cardiac medications before your next appointment, please call your pharmacy*   Lab Work: Your physician recommends that you return for a FASTING lipid profile and hepatic in 2 months:  Please have your labs drawn at the [facility] medical mall. You do not need an appointment. Lab hours are Mon-Fri 7am-pm If you have labs (blood work) drawn today and your tests are completely normal, you will receive your results only by: Marland Kitchen MyChart Message (if you have MyChart) OR . A paper copy in the mail If you have any lab test that is abnormal or we need to change your treatment, we will call you to review the results.   Testing/Procedures: None ordered   Follow-Up: At [facility], you and your health needs are our priority.  As part of our continuing mission to provide you with exceptional heart care, we have created designated Provider Care Teams.  These Care Teams include your primary Cardiologist (physician) and Advanced Practice Providers (APPs -  Physician Assistants and Nurse Practitioners) who all work together to provide you with the care you need, when you need it.  We recommend signing up for the patient portal called "MyChart".  Sign up information is provided on this After Visit Summary.  MyChart is used to connect with patients for Virtual Visits (Telemedicine).  Patients are able to view lab/test results, encounter notes, upcoming appointments, etc.  Non-urgent messages can be sent to your provider as well.   To learn more about what you can do with MyChart, go to [email or web address].    Your next appointment:   12 month(s)  The format for your next appointment:   In Person  Provider:   You may see Kathlyn Sacramento, MD or one of the following Advanced Practice Providers on your designated Care Team:    Murray Hodgkins, NP  Christell Faith, PA-C  Marrianne Mood, PA-C  Cadence Kathlen Mody, [place]    Other Instructions N/A

## 2020-04-20 NOTE — Progress Notes (Signed)
Cardiology Office Note   Date:  04/20/2020   ID:  Travis Higgins, Travis Higgins 12-29-1950, MRN [id number]  PCP:  Lavera Guise, MD  Cardiologist:   Kathlyn Sacramento, MD   Chief Complaint  Patient presents with  . other    12 month follow up. Meds reviewed by the pt. verbally. "doing well."      History of Present Illness: Travis Higgins is a 69 y.o. male who is here today for follow-up visit regarding abnormal stress test and presumed coronary artery disease. He had previous cardiac catheterization in 2015 in [place] which showed mild nonobstructive disease affecting the LAD and left circumflex.  No revascularization was required.  He has prolonged history of type 2 diabetes, essential hypertension, hyperlipidemia and hypothyroidism.  He is a previous smoker with no family history of coronary artery disease.   He is a retired Radio producer.   He was evaluated in 2018 for exertional dyspnea. He underwent a treadmill nuclear stress test which was intermediate risk study with evidence of prior anterior/anteroseptal infarct as well as apical inferior defect.  The distribution was consistent with a distal LAD as well as OM branch territory.  EF was 49%. Echocardiogram showed an EF of 60 to 65% with no significant valvular abnormalities.  Cardiac catheterization was recommended but was declined by the patient given that his previous cath in 2015 did not show obstructive disease.  Since his last visit, he was diagnosed with moderate carotid disease which is being followed by vascular surgery.  He does take aspirin 81 mg once daily.  He is still not on hyperlipidemia medication.  He had myalgia with statins in the past.  He denies chest pain or shortness of breath.  He tries to walk 2 to 3 miles for exercise when the weather allows.  Past Medical History:  Diagnosis Date  . Anemia    in past  . CAD (coronary artery disease)    Cardiac catheterization done in July 2015 in [place] by Dr.  Jori Moll fields showed mild nonobstructive disease in the mid LAD and distal left circumflex with normal ejection fraction.  . Diabetes mellitus without complication ([place])   . Hypertension   . Thyroid disease     Past Surgical History:  Procedure Laterality Date  . COLONOSCOPY    . COLONOSCOPY WITH PROPOFOL N/A 09/20/2015   Procedure: COLONOSCOPY WITH PROPOFOL;  Surgeon: Lucilla Lame, MD;  Location: [place];  Service: Endoscopy;  Laterality: N/A;  . POLYPECTOMY  09/20/2015   Procedure: POLYPECTOMY INTESTINAL;  Surgeon: Lucilla Lame, MD;  Location: [place];  Service: Endoscopy;;  Sigmoid colon polyp x 1 Rectal polyp x 2     Current Outpatient Medications  Medication Sig Dispense Refill  . glipiZIDE (GLUCOTROL) 5 MG tablet Take 1 tablet (5 mg total) by mouth 2 (two) times daily. 180 tablet 1  . levothyroxine (SYNTHROID) 75 MCG tablet Take 1 tablet (75 mcg total) by mouth daily before breakfast. 90 tablet 1  . omeprazole (PRILOSEC) 40 MG capsule Take 40 mg by mouth at bedtime.    . ramipril (ALTACE) 2.5 MG capsule Take 1 capsule (2.5 mg total) by mouth daily. 90 capsule 1   No current facility-administered medications for this visit.    Allergies:   Levothyroxine    Social History:  The patient  reports that he has quit smoking. He has never used smokeless tobacco. He reports that he does not drink alcohol and does not use drugs.   Family  History:  The patient's family history is negative for coronary artery disease.   ROS:  Please see the history of present illness.   Otherwise, review of systems are positive for none.   All other systems are reviewed and negative.    PHYSICAL EXAM: VS:  BP 124/60 (BP Location: Left Arm, Patient Position: Sitting, Cuff Size: Normal)   Pulse (!) 50   Ht 5\' 5"  (1.651 m)   Wt 166 lb 2 oz (75.4 kg)   SpO2 98%   BMI 27.64 kg/m  , BMI Body mass index is 27.64 kg/m. GEN: Well nourished, well developed, in no acute distress   HEENT: normal  Neck: no JVD, carotid bruits, or masses Cardiac: RRR; no murmurs, rubs, or gallops,no edema  Respiratory:  clear to auscultation bilaterally, normal work of breathing GI: soft, nontender, nondistended, + BS MS: no deformity or atrophy  Skin: warm and dry, no rash Neuro:  Strength and sensation are intact Psych: euthymic mood, full affect   EKG:  EKG is ordered today. The ekg ordered today demonstrates sinus bradycardia with right bundle branch block and possible old inferior infarct.   Recent Labs: 02/14/2020: ALT 22; BUN 14; Creatinine, Ser 0.90; Hemoglobin 13.2; Platelets 205; Potassium 4.3; Sodium 136; TSH 2.498    Lipid Panel    Component Value Date/Time   CHOL 169 02/14/2020 0739   TRIG 93 02/14/2020 0739   HDL 34 (L) 02/14/2020 0739   CHOLHDL 5.0 02/14/2020 0739   VLDL 19 02/14/2020 0739   LDLCALC 116 (H) 02/14/2020 0739      Wt Readings from Last 3 Encounters:  04/20/20 166 lb 2 oz (75.4 kg)  03/25/20 165 lb (74.8 kg)  03/11/20 167 lb 12.8 oz (76.1 kg)       PAD Screen 05/24/2017  Previous PAD dx? No  Previous surgical procedure? No  Pain with walking? No  Feet/toe relief with dangling? No  Painful, non-healing ulcers? No  Extremities discolored? No      ASSESSMENT AND PLAN:  1.  Coronary artery disease involving native coronary arteries without angina: The patient is doing well overall with no anginal symptoms.  Continue medical therapy.  Continue aspirin 81 mg once daily.  2.  Hyperlipidemia: The patient had myalgia with rosuvastatin and does not want to try another statin.  Given coronary artery disease, diabetes and recent diagnosis of carotid disease, recommend a target LDL of less than 70.  This was discussed with him today and I elected to start him on Zetia 10 mg daily.  He was concerned about the cost given that he does not have a prescription coverage but we provided him with a good Rx coupon so he can get 87-month supply for $17.   Repeat lipid and liver profile in 2 months.  3.  Essential hypertension: Blood pressure is controlled on current medications.  4.  Carotid disease: Moderate and followed by vascular surgery.   Disposition:   FU with me in 12 months.   Signed,  Kathlyn Sacramento, MD  04/20/2020 3:42 PM    [place]

## 2020-05-11 ENCOUNTER — Ambulatory Visit: Payer: Medicare Other | Admitting: Nurse Practitioner

## 2020-05-18 ENCOUNTER — Other Ambulatory Visit
Admission: RE | Admit: 2020-05-18 | Discharge: 2020-05-18 | Disposition: A | Discharge status: Home / Self Care | Payer: Medicare Other | Source: Ambulatory Visit | Attending: Hospice and Palliative Medicine | Admitting: Hospice and Palliative Medicine

## 2020-05-18 ENCOUNTER — Ambulatory Visit (INDEPENDENT_AMBULATORY_CARE_PROVIDER_SITE_OTHER): Payer: Medicare Other | Admitting: Gastroenterology

## 2020-05-18 ENCOUNTER — Encounter: Payer: Self-pay | Admitting: Gastroenterology

## 2020-05-18 ENCOUNTER — Ambulatory Visit: Payer: Medicare Other | Admitting: Hospice and Palliative Medicine

## 2020-05-18 ENCOUNTER — Other Ambulatory Visit: Payer: Self-pay

## 2020-05-18 VITALS — BP 112/58 | HR 50 | Ht 65.0 in | Wt 168.8 lb

## 2020-05-18 DIAGNOSIS — K219 Gastro-esophageal reflux disease without esophagitis: Secondary | ICD-10-CM

## 2020-05-18 DIAGNOSIS — R7879 Finding of abnormal level of heavy metals in blood: Secondary | ICD-10-CM | POA: Insufficient documentation

## 2020-05-18 DIAGNOSIS — I6523 Occlusion and stenosis of bilateral carotid arteries: Secondary | ICD-10-CM | POA: Diagnosis not present

## 2020-05-18 LAB — IRON AND TIBC
Iron: 33 ug/dL — ABNORMAL LOW (ref 45–182)
Saturation Ratios: 7 % — ABNORMAL LOW (ref 17.9–39.5)
TIBC: 454 ug/dL — ABNORMAL HIGH (ref 250–450)
UIBC: 421 ug/dL

## 2020-05-18 LAB — FERRITIN: Ferritin: 9 ng/mL — ABNORMAL LOW (ref 24–336)

## 2020-05-18 LAB — FOLATE: Folate: 20.8 ng/mL (ref >5.9)

## 2020-05-18 NOTE — Progress Notes (Signed)
Gastroenterology Consultation  Referring Provider:     Lavera Guise, MD Primary Care Physician:  Lavera Guise, MD Primary Gastroenterologist:  Dr. Allen Norris     Reason for Consultation:     Sore throat        HPI:   Travis Higgins is a 69 y.o. y/o male referred for consultation & management of The throat by Dr. Humphrey Rolls, Timoteo Gaul, MD.  This patient comes in today with a report of sore throat.  He states that his soreness in his throat have been usually after he has been speaking for some time.  He does not report that symptoms be worse any time of day.  He also denies any overt heartburn or dysphagia.  He denies that food does not seem to get stuck in his throat nor does he have to vomit any food up.  He will sometimes wash his teeth and his mouth and this will alleviate his symptoms for a few hours.  The patient denies any nausea vomiting fevers chills black stools or bloody stools.  He also denies any unexplained weight loss.  The patient had a colonoscopy in the past with polyps and was due for a repeat colonoscopy in April of next year. The patient is on omeprazole 40 mg a day and does not report this to help his throat issues.  The patient was seen by ENT and suggested that he may have his symptoms related to reflux.  Past Medical History:  Diagnosis Date  . Anemia    in past  . CAD (coronary artery disease)    Cardiac catheterization done in July 2015 in [place] by Dr. Jori Moll fields showed mild nonobstructive disease in the mid LAD and distal left circumflex with normal ejection fraction.  . Diabetes mellitus without complication ([place])   . Hypertension   . Thyroid disease     Past Surgical History:  Procedure Laterality Date  . COLONOSCOPY    . COLONOSCOPY WITH PROPOFOL N/A 09/20/2015   Procedure: COLONOSCOPY WITH PROPOFOL;  Surgeon: Lucilla Lame, MD;  Location: [place];  Service: Endoscopy;  Laterality: N/A;  . POLYPECTOMY  09/20/2015   Procedure: POLYPECTOMY  INTESTINAL;  Surgeon: Lucilla Lame, MD;  Location: [place];  Service: Endoscopy;;  Sigmoid colon polyp x 1 Rectal polyp x 2    Prior to Admission medications   Medication Sig Start Date End Date Taking? Authorizing Provider  ezetimibe (ZETIA) 10 MG tablet Take 1 tablet (10 mg total) by mouth daily. 04/20/20 07/19/20 Yes Wellington Hampshire, MD  glipiZIDE (GLUCOTROL) 5 MG tablet Take 1 tablet (5 mg total) by mouth 2 (two) times daily. 01/07/20  Yes Ronnell Freshwater, NP  levothyroxine (SYNTHROID) 75 MCG tablet Take 1 tablet (75 mcg total) by mouth daily before breakfast. 02/09/20  Yes Boscia, Heather E, NP  omeprazole (PRILOSEC) 40 MG capsule Take 40 mg by mouth at bedtime. 12/10/19  Yes [provider]  ramipril (ALTACE) 2.5 MG capsule Take 1 capsule (2.5 mg total) by mouth daily. 04/02/20  Yes Ronnell Freshwater, NP    History reviewed. No pertinent family history.   Social History   Tobacco Use  . Smoking status: Former Research scientist (life sciences)  . Smokeless tobacco: Never Used  . Tobacco comment: quit approx 2013  Vaping Use  . Vaping Use: Never used  Substance Use Topics  . Alcohol use: No  . Drug use: No    Allergies as of 05/18/2020 - Review Complete 05/18/2020  Allergen Reaction Noted  . Levothyroxine  06/14/2017    Review of Systems:    All systems reviewed and negative except where noted in HPI.   Physical Exam:  BP (!) 112/58   Pulse (!) 50   Ht 5\' 5"  (1.651 m)   Wt 168 lb 12.8 oz (76.6 kg)   BMI 28.09 kg/m  No LMP for male patient. General:   Alert,  Well-developed, well-nourished, pleasant and cooperative in NAD Head:  Normocephalic and atraumatic. Eyes:  Sclera clear, no icterus.   Conjunctiva pink. Ears:  Normal auditory acuity. Neck:  Supple; no masses or thyromegaly. Lungs:  Respirations even and unlabored.  Clear throughout to auscultation.   No wheezes, crackles, or rhonchi. No acute distress. Heart:  Regular rate and rhythm; no murmurs, clicks, rubs, or  gallops. Abdomen:  Normal bowel sounds.  No bruits.  Soft, non-tender and non-distended without masses, hepatosplenomegaly or hernias noted.  No guarding or rebound tenderness.  Negative Carnett sign.   Rectal:  Deferred.  Pulses:  Normal pulses noted. Extremities:  No clubbing or edema.  No cyanosis. Neurologic:  Alert and oriented x3;  grossly normal neurologically. Skin:  Intact without significant lesions or rashes.  No jaundice. Lymph Nodes:  No significant cervical adenopathy. Psych:  Alert and cooperative. Normal mood and affect.  Imaging Studies: No results found.  Assessment and Plan:   Travis Higgins is a 69 y.o. y/o male who comes in with a history of sore throat and and ENT visit that suggested he may have reflux symptoms causing his throat soreness. The patient has been given samples of Dexilant to see if his sore throat is relieved with a dual delayed release PPI with 24-hour acid suppression.  He has been told that if his symptoms do not improve that acid reflux is unlikely the cause of his symptoms.  The patient has also requested to be set up for his colonoscopy and his wife's colonoscopy next April.  The patient will be scheduled for that.  The patient will contact me if his symptoms do not improve.  The patient has been explained the plan and agrees with it.    Lucilla Lame, MD. Marval Regal    Note: This dictation was prepared with Dragon dictation along with smaller phrase technology. Any transcriptional errors that result from this process are unintentional.

## 2020-05-19 ENCOUNTER — Other Ambulatory Visit: Payer: Self-pay

## 2020-05-19 DIAGNOSIS — Z86010 Personal history of colonic polyps: Secondary | ICD-10-CM

## 2020-05-28 ENCOUNTER — Telehealth: Payer: Self-pay | Admitting: Gastroenterology

## 2020-05-28 NOTE — Telephone Encounter (Signed)
Pt needs to reschedule procedure please

## 2020-05-28 NOTE — Telephone Encounter (Signed)
LVM for pt to return my call to reschedule colonoscopy.

## 2020-05-28 NOTE — Telephone Encounter (Signed)
Pt's wife returned my call and requested to reschedule colonoscopy to 07/05/20.

## 2020-05-31 ENCOUNTER — Other Ambulatory Visit: Payer: Self-pay

## 2020-05-31 ENCOUNTER — Encounter: Payer: Self-pay | Admitting: Hospice and Palliative Medicine

## 2020-05-31 ENCOUNTER — Ambulatory Visit (INDEPENDENT_AMBULATORY_CARE_PROVIDER_SITE_OTHER): Payer: Medicare Other | Admitting: Hospice and Palliative Medicine

## 2020-05-31 VITALS — BP 127/76 | HR 62 | Temp 97.7°F | Resp 16 | Ht 65.0 in | Wt 167.8 lb

## 2020-05-31 DIAGNOSIS — E611 Iron deficiency: Secondary | ICD-10-CM | POA: Diagnosis not present

## 2020-05-31 DIAGNOSIS — K219 Gastro-esophageal reflux disease without esophagitis: Secondary | ICD-10-CM

## 2020-05-31 DIAGNOSIS — E782 Mixed hyperlipidemia: Secondary | ICD-10-CM

## 2020-05-31 DIAGNOSIS — E1165 Type 2 diabetes mellitus with hyperglycemia: Secondary | ICD-10-CM

## 2020-05-31 DIAGNOSIS — I6521 Occlusion and stenosis of right carotid artery: Secondary | ICD-10-CM

## 2020-05-31 LAB — POCT GLYCOSYLATED HEMOGLOBIN (HGB A1C): Hemoglobin A1C: 6.8 % — AB (ref 4.0–5.6)

## 2020-05-31 MED ORDER — GLIPIZIDE 5 MG PO TABS: 5.0000 mg | ORAL_TABLET | Freq: Two times a day (BID) | ORAL | 1 refills | Status: DC

## 2020-05-31 NOTE — Progress Notes (Signed)
[facility] [place], [place] [address]  Internal MEDICINE  Office Visit Note  Patient Name: Travis Higgins  [id number]  [id number]  Date of Service: 06/01/2020  Chief Complaint  Patient presents with  . Diabetes  . Hypertension  . Quality Metric Gaps    Opthamology Exam    HPI Patient is here for routine follow-up Wife is present during exam today Reviewed labs--low iron and ferritin levels Followed by vascular surgery for carotid artery steonsis-right ICA 60% stenosed and left ICA <50% stenosed, no further intervention warranted at this time, 6 month follow-up US Followed by cardiology for CAD-started on Zetia as he was unable to tolerate statin therapy in the past Sore throat of unclear etiology--has been evaluated by ENT who then referred to GI for possible reflux causing sore throat, was given samples of Dexilant to take while continuing omeprazole--symptoms have improved, scheduled for colonoscopy next year  He continues to walk about 2-3 miles per day, wife has made changes in her cooking to substitute for healthier options Needs eye exam for this year  Current Medication: Outpatient Encounter Medications as of 05/31/2020  Medication Sig  . ezetimibe (ZETIA) 10 MG tablet Take 1 tablet (10 mg total) by mouth daily.  Marland Kitchen levothyroxine (SYNTHROID) 75 MCG tablet Take 1 tablet (75 mcg total) by mouth daily before breakfast.  . ramipril (ALTACE) 2.5 MG capsule Take 1 capsule (2.5 mg total) by mouth daily.  . [DISCONTINUED] glipiZIDE (GLUCOTROL) 5 MG tablet Take 1 tablet (5 mg total) by mouth 2 (two) times daily.  . [DISCONTINUED] omeprazole (PRILOSEC) 40 MG capsule Take 40 mg by mouth at bedtime.  Marland Kitchen glipiZIDE (GLUCOTROL) 5 MG tablet Take 1 tablet (5 mg total) by mouth 2 (two) times daily.   No facility-administered encounter medications on file as of 05/31/2020.    Surgical History: Past Surgical History:  Procedure Laterality Date  . COLONOSCOPY     . COLONOSCOPY WITH PROPOFOL N/A 09/20/2015   Procedure: COLONOSCOPY WITH PROPOFOL;  Surgeon: Lucilla Lame, MD;  Location: [place];  Service: Endoscopy;  Laterality: N/A;  . POLYPECTOMY  09/20/2015   Procedure: POLYPECTOMY INTESTINAL;  Surgeon: Lucilla Lame, MD;  Location: [place];  Service: Endoscopy;;  Sigmoid colon polyp x 1 Rectal polyp x 2    Medical History: Past Medical History:  Diagnosis Date  . Anemia    in past  . CAD (coronary artery disease)    Cardiac catheterization done in July 2015 in [place] by Dr. Jori Moll fields showed mild nonobstructive disease in the mid LAD and distal left circumflex with normal ejection fraction.  . Diabetes mellitus without complication ([place])   . Hypertension   . Thyroid disease     Family History: History reviewed. No pertinent family history.  Social History   Socioeconomic History  . Marital status: Married    Spouse name: Not on file  . Number of children: Not on file  . Years of education: Not on file  . Highest education level: Not on file  Occupational History  . Not on file  Tobacco Use  . Smoking status: Former Research scientist (life sciences)  . Smokeless tobacco: Never Used  . Tobacco comment: quit approx 2013  Vaping Use  . Vaping Use: Never used  Substance and Sexual Activity  . Alcohol use: No  . Drug use: No  . Sexual activity: Not on file  Other Topics Concern  . Not on file  Social History Narrative  . Not on file  Social Determinants of Health   Financial Resource Strain: Not on file  Food Insecurity: Not on file  Transportation Needs: Not on file  Physical Activity: Not on file  Stress: Not on file  Social Connections: Not on file  Intimate Partner Violence: Not on file    Review of Systems  Constitutional: Negative for chills, fatigue and unexpected weight change.  HENT: Negative for congestion, postnasal drip, rhinorrhea, sneezing and sore throat.   Eyes: Negative for redness.  Respiratory:  Negative for cough, chest tightness and shortness of breath.   Cardiovascular: Negative for chest pain and palpitations.  Gastrointestinal: Negative for abdominal pain, constipation, diarrhea, nausea and vomiting.  Genitourinary: Negative for dysuria and frequency.  Musculoskeletal: Negative for arthralgias, back pain, joint swelling and neck pain.  Skin: Negative for rash.  Neurological: Negative for tremors and numbness.  Hematological: Negative for adenopathy. Does not bruise/bleed easily.  Psychiatric/Behavioral: Negative for behavioral problems (Depression), sleep disturbance and suicidal ideas. The patient is not nervous/anxious.     Vital Signs: BP 127/76   Pulse 62   Temp 97.7 F (36.5 C)   Resp 16   Ht 5\' 5"  (1.651 m)   Wt 167 lb 12.8 oz (76.1 kg)   SpO2 99%   BMI 27.92 kg/m    Physical Exam Vitals reviewed.  Constitutional:      Appearance: Normal appearance. He is normal weight.  Cardiovascular:     Rate and Rhythm: Normal rate and regular rhythm.     Pulses: Normal pulses.     Heart sounds: Normal heart sounds.  Pulmonary:     Effort: Pulmonary effort is normal.     Breath sounds: Normal breath sounds.  Abdominal:     General: Abdomen is flat.  Musculoskeletal:        General: Normal range of motion.  Skin:    General: Skin is warm.  Neurological:     General: No focal deficit present.     Mental Status: He is alert and oriented to person, place, and time. Mental status is at baseline.  Psychiatric:        Mood and Affect: Mood normal.        Behavior: Behavior normal.        Thought Content: Thought content normal.        Judgment: Judgment normal.    Assessment/Plan: 1. Type 2 diabetes mellitus with hyperglycemia, without long-term current use of insulin (HCC) A1C 6.8 today--discussed changing medications to discontinue Glipizide due to risk of hypoglycemia, glucose level on lab check on 67, wife concerned about changing medications due to finances  as they do not currently have prescription coverage Advised to closely monitor glucose levels--may need to consider tapering down dose Needs annual diabetic eye exam--referral placed, he may contact [facility] and schedule own appointment - POCT HgB A1C - glipiZIDE (GLUCOTROL) 5 MG tablet; Take 1 tablet (5 mg total) by mouth 2 (two) times daily.  Dispense: 180 tablet; Refill: 1 - Ambulatory referral to Ophthalmology  2. Stenosis of right carotid artery Followed by vascular surgery, schedule for follow-up US in 6 months, continue with Zetia at this time  3. Mixed hyperlipidemia Continue Zetia at this time and routine monitoring  4. Gastroesophageal reflux disease without esophagitis Symptoms more controlled with addition of Dexilant--continue omeprazole at this time  5. Iron Deficiency Most likely from vegetarian diet--discussed with wife vegetables high in iron to incorporate into their diet Again due to not having prescription coverage they would  prefer to buy OTC iron supplementation   General Counseling: Nollan verbalizes understanding of the findings of todays visit and agrees with plan of treatment. I have discussed any further diagnostic evaluation that may be needed or ordered today. We also reviewed his medications today. he has been encouraged to call the office with any questions or concerns that should arise related to todays visit.    Orders Placed This Encounter  Procedures  . Ambulatory referral to Ophthalmology  . POCT HgB A1C    Meds ordered this encounter  Medications  . glipiZIDE (GLUCOTROL) 5 MG tablet    Sig: Take 1 tablet (5 mg total) by mouth 2 (two) times daily.    Dispense:  180 tablet    Refill:  1    Update to BID dosing.    Time spent: 30 Minutes Time spent includes review of chart, medications, test results and follow-up plan with the patient.  This patient was seen by Theodoro Grist AGNP-C in Collaboration with Dr Lavera Guise as a  part of collaborative care agreement     Tanna Furry. Ahmad Vanwey AGNP-C Internal medicine

## 2020-06-01 ENCOUNTER — Encounter: Payer: Self-pay | Admitting: Hospice and Palliative Medicine

## 2020-06-01 ENCOUNTER — Other Ambulatory Visit: Payer: Self-pay

## 2020-06-02 ENCOUNTER — Ambulatory Visit: Payer: Medicare Other | Admitting: Cardiovascular Disease

## 2020-06-19 ENCOUNTER — Other Ambulatory Visit
Admission: RE | Admit: 2020-06-19 | Discharge: 2020-06-19 | Disposition: A | Discharge status: Home / Self Care | Payer: Medicare Other | Attending: Cardiovascular Disease | Admitting: Cardiovascular Disease

## 2020-06-19 DIAGNOSIS — E785 Hyperlipidemia, unspecified: Secondary | ICD-10-CM | POA: Diagnosis not present

## 2020-06-19 LAB — HEPATIC FUNCTION PANEL
ALT: 19 U/L (ref 0–44)
AST: 21 U/L (ref 15–41)
Albumin: 4.2 g/dL (ref 3.5–5.0)
Alkaline Phosphatase: 84 U/L (ref 38–126)
Bilirubin, Direct: 0.1 mg/dL (ref 0.0–0.2)
Indirect Bilirubin: 0.9 mg/dL (ref 0.3–0.9)
Total Bilirubin: 1 mg/dL (ref 0.3–1.2)
Total Protein: 7.3 g/dL (ref 6.5–8.1)

## 2020-06-19 LAB — LIPID PANEL
Cholesterol: 157 mg/dL (ref 0–200)
HDL: 36 mg/dL — ABNORMAL LOW (ref >40)
LDL Cholesterol: 98 mg/dL (ref 0–99)
Total CHOL/HDL Ratio: 4.4 RATIO
Triglycerides: 116 mg/dL (ref <150)
VLDL: 23 mg/dL (ref 0–40)

## 2020-06-28 ENCOUNTER — Encounter: Payer: Self-pay | Admitting: Gastroenterology

## 2020-06-28 ENCOUNTER — Other Ambulatory Visit: Payer: Self-pay

## 2020-07-01 ENCOUNTER — Other Ambulatory Visit
Admission: RE | Admit: 2020-07-01 | Discharge: 2020-07-01 | Disposition: A | Discharge status: Home / Self Care | Payer: Medicare Other | Source: Ambulatory Visit | Attending: Gastroenterology | Admitting: Gastroenterology

## 2020-07-01 ENCOUNTER — Other Ambulatory Visit: Payer: Self-pay

## 2020-07-01 DIAGNOSIS — Z01812 Encounter for preprocedural laboratory examination: Secondary | ICD-10-CM | POA: Insufficient documentation

## 2020-07-01 DIAGNOSIS — Z20822 Contact with and (suspected) exposure to covid-19: Secondary | ICD-10-CM | POA: Insufficient documentation

## 2020-07-01 LAB — SARS CORONAVIRUS 2 (TAT 6-24 HRS): SARS Coronavirus 2: NEGATIVE

## 2020-07-01 NOTE — Discharge Instructions (Signed)

## 2020-07-05 ENCOUNTER — Encounter
Admission: RE | Disposition: A | Discharge status: Home / Self Care | Payer: Self-pay | Source: Home / Self Care | Attending: Gastroenterology

## 2020-07-05 ENCOUNTER — Other Ambulatory Visit: Payer: Self-pay

## 2020-07-05 ENCOUNTER — Ambulatory Visit: Payer: Medicare Other | Admitting: Anesthesiology

## 2020-07-05 ENCOUNTER — Ambulatory Visit
Admission: RE | Admit: 2020-07-05 | Discharge: 2020-07-05 | Disposition: A | Discharge status: Home / Self Care | Payer: Medicare Other | Attending: Gastroenterology | Admitting: Gastroenterology

## 2020-07-05 ENCOUNTER — Encounter: Payer: Self-pay | Admitting: Gastroenterology

## 2020-07-05 DIAGNOSIS — Z7984 Long term (current) use of oral hypoglycemic drugs: Secondary | ICD-10-CM | POA: Insufficient documentation

## 2020-07-05 DIAGNOSIS — Z79899 Other long term (current) drug therapy: Secondary | ICD-10-CM | POA: Insufficient documentation

## 2020-07-05 DIAGNOSIS — Z1211 Encounter for screening for malignant neoplasm of colon: Secondary | ICD-10-CM | POA: Diagnosis not present

## 2020-07-05 DIAGNOSIS — Z79890 Hormone replacement therapy (postmenopausal): Secondary | ICD-10-CM | POA: Diagnosis not present

## 2020-07-05 DIAGNOSIS — Z86010 Personal history of colonic polyps: Secondary | ICD-10-CM

## 2020-07-05 DIAGNOSIS — K635 Polyp of colon: Secondary | ICD-10-CM

## 2020-07-05 DIAGNOSIS — Z881 Allergy status to other antibiotic agents status: Secondary | ICD-10-CM | POA: Diagnosis not present

## 2020-07-05 DIAGNOSIS — D122 Benign neoplasm of ascending colon: Secondary | ICD-10-CM | POA: Diagnosis not present

## 2020-07-05 DIAGNOSIS — K648 Other hemorrhoids: Secondary | ICD-10-CM | POA: Diagnosis not present

## 2020-07-05 DIAGNOSIS — Z87891 Personal history of nicotine dependence: Secondary | ICD-10-CM | POA: Insufficient documentation

## 2020-07-05 HISTORY — DX: Hypothyroidism, unspecified: E03.9

## 2020-07-05 HISTORY — PX: COLONOSCOPY WITH PROPOFOL: SHX5780

## 2020-07-05 HISTORY — PX: POLYPECTOMY: SHX5525

## 2020-07-05 LAB — GLUCOSE, CAPILLARY
Glucose-Capillary: 101 mg/dL — ABNORMAL HIGH (ref 70–99)
Glucose-Capillary: 84 mg/dL (ref 70–99)

## 2020-07-05 SURGERY — COLONOSCOPY WITH PROPOFOL
Anesthesia: General | Site: Rectum

## 2020-07-05 MED ORDER — ACETAMINOPHEN 160 MG/5ML PO SOLN: 325.0000 mg | Freq: Once | ORAL | Status: DC

## 2020-07-05 MED ORDER — PROPOFOL 10 MG/ML IV BOLUS
INTRAVENOUS | Status: DC | PRN
  Administered 2020-07-05: 60 mg via INTRAVENOUS
  Administered 2020-07-05 (×3): 20 mg via INTRAVENOUS

## 2020-07-05 MED ORDER — STERILE WATER FOR IRRIGATION IR SOLN
Status: DC | PRN
  Administered 2020-07-05: .05 mL

## 2020-07-05 MED ORDER — SODIUM CHLORIDE 0.9 % IV SOLN: INTRAVENOUS | Status: DC

## 2020-07-05 MED ORDER — LIDOCAINE HCL (CARDIAC) PF 100 MG/5ML IV SOSY
PREFILLED_SYRINGE | INTRAVENOUS | Status: DC | PRN
  Administered 2020-07-05: 60 mg via INTRAVENOUS

## 2020-07-05 MED ORDER — LACTATED RINGERS IV SOLN: INTRAVENOUS | Status: DC

## 2020-07-05 MED ORDER — ACETAMINOPHEN 325 MG PO TABS: 325.0000 mg | ORAL_TABLET | Freq: Once | ORAL | Status: DC

## 2020-07-05 SURGICAL SUPPLY — 7 items
FORCEPS BIOP RAD 4 LRG CAP 4 (CUTTING FORCEPS) ×3 IMPLANT
GOWN CVR UNV OPN BCK APRN NK (MISCELLANEOUS) ×4 IMPLANT
GOWN ISOL THUMB LOOP REG UNIV (MISCELLANEOUS) ×6
KIT PRC NS LF DISP ENDO (KITS) ×2 IMPLANT
KIT PROCEDURE OLYMPUS (KITS) ×3
MANIFOLD NEPTUNE II (INSTRUMENTS) ×3 IMPLANT
WATER STERILE IRR 250ML POUR (IV SOLUTION) ×3 IMPLANT

## 2020-07-05 NOTE — Anesthesia Preprocedure Evaluation (Signed)
Anesthesia Evaluation  Patient identified by MRN, date of birth, ID band Patient awake    Reviewed: Allergy & Precautions, H&P , NPO status , Patient's Chart, lab work & pertinent test results  Airway Mallampati: III  TM Distance: >3 FB Neck ROM: full    Dental  (+) Edentulous Upper, Edentulous Lower   Pulmonary former smoker,    Pulmonary exam normal breath sounds clear to auscultation       Cardiovascular hypertension, + CAD  Normal cardiovascular exam Rhythm:regular Rate:Normal     Neuro/Psych    GI/Hepatic   Endo/Other  diabetes, Type 2Hypothyroidism   Renal/GU      Musculoskeletal   Abdominal   Peds  Hematology   Anesthesia Other Findings   Reproductive/Obstetrics                             Anesthesia Physical Anesthesia Plan  ASA: II  Anesthesia Plan: General   Post-op Pain Management:    Induction: Intravenous  PONV Risk Score and Plan: 2 and Treatment may vary due to age or medical condition, Propofol infusion and TIVA  Airway Management Planned: Natural Airway  Additional Equipment:   Intra-op Plan:   Post-operative Plan:   Informed Consent: I have reviewed the patients History and Physical, chart, labs and discussed the procedure including the risks, benefits and alternatives for the proposed anesthesia with the patient or authorized representative who has indicated his/her understanding and acceptance.     Dental Advisory Given  Plan Discussed with: CRNA  Anesthesia Plan Comments:         Anesthesia Quick Evaluation

## 2020-07-05 NOTE — Anesthesia Postprocedure Evaluation (Signed)
Anesthesia Post Note  Patient: Travis Higgins  Procedure(s) Performed: COLONOSCOPY WITH PROPOFOL (N/A Rectum) POLYPECTOMY (Rectum)     Patient location during evaluation: PACU Anesthesia Type: General Level of consciousness: awake and alert and oriented Pain management: satisfactory to patient Vital Signs Assessment: post-procedure vital signs reviewed and stable Respiratory status: spontaneous breathing, nonlabored ventilation and respiratory function stable Cardiovascular status: blood pressure returned to baseline and stable Postop Assessment: Adequate PO intake and No signs of nausea or vomiting Anesthetic complications: no   No complications documented.  Raliegh Ip

## 2020-07-05 NOTE — Op Note (Addendum)
[facility] Gastroenterology Patient Name: Travis Higgins Procedure Date: 07/05/2020 7:55 AM MRN: [id number] Account #: [id number] Date of Birth: January 27, 1951 Admit Type: Outpatient Age: 70 Room: [facility] OR ROOM 01 Gender: Male Note Status: Supervisor Override Procedure:             Colonoscopy Indications:           Screening for colorectal malignant neoplasm Providers:             Lucilla Lame MD, MD Referring MD:          Lavera Guise, MD (Referring MD) Medicines:             Propofol per Anesthesia Complications:         No immediate complications. Procedure:             Pre-Anesthesia Assessment:                        - Prior to the procedure, a History and Physical was                         performed, and patient medications and allergies were                         reviewed. The patient's tolerance of previous                         anesthesia was also reviewed. The risks and benefits                         of the procedure and the sedation options and risks                         were discussed with the patient. All questions were                         answered, and informed consent was obtained. Prior                         Anticoagulants: The patient has taken no previous                         anticoagulant or antiplatelet agents. ASA Grade                         Assessment: II - A patient with mild systemic disease.                         After reviewing the risks and benefits, the patient                         was deemed in satisfactory condition to undergo the                         procedure.                        After obtaining informed consent, the colonoscope was  passed under direct vision. Throughout the procedure,                         the patient's blood pressure, pulse, and oxygen                         saturations were monitored continuously. The                         Colonoscope was introduced  through the anus and                         advanced to the the cecum, identified by appendiceal                         orifice and ileocecal valve. The colonoscopy was                         performed without difficulty. The patient tolerated                         the procedure well. The quality of the bowel                         preparation was excellent. Findings:      The perianal and digital rectal examinations were normal.      Two sessile polyps were found in the ascending colon. The polyps were 2       to 3 mm in size. These polyps were removed with a cold biopsy forceps.       Resection and retrieval were complete.      Non-bleeding internal hemorrhoids were found during retroflexion. The       hemorrhoids were Grade I (internal hemorrhoids that do not prolapse). Impression:            - Two 2 to 3 mm polyps in the ascending colon, removed                         with a cold biopsy forceps. Resected and retrieved.                        - Non-bleeding internal hemorrhoids. Recommendation:        - Discharge patient to home.                        - Resume previous diet.                        - Continue present medications.                        - Await pathology results.                        - No repeat colonoscopy due to current age (49 years                         or older). Procedure Code(s):     --- Professional ---  45380, Colonoscopy, flexible; with biopsy, single or                         multiple Diagnosis Code(s):     --- Professional ---                        Z12.11, Encounter for screening for malignant neoplasm                         of colon                        K63.5, Polyp of colon CPT copyright 2019 American Medical Association. All rights reserved. The codes documented in this report are preliminary and upon coder review may  be revised to meet current compliance requirements. Lucilla Lame MD, MD 07/05/2020 8:12:48  AM This report has been signed electronically. Number of Addenda: 0 Note Initiated On: 07/05/2020 7:55 AM Scope Withdrawal Time: 0 hours 6 minutes 59 seconds  Total Procedure Duration: 0 hours 9 minutes 8 seconds  Estimated Blood Loss:  Estimated blood loss: none.      [facility]

## 2020-07-05 NOTE — H&P (Signed)
Lucilla Lame, MD [address] [place], [place] [address] Phone: [phone] Fax : [phone]  Primary Care Physician:  Lavera Guise, MD Primary Gastroenterologist:  Dr. Allen Norris  Pre-Procedure History & Physical: HPI:  Travis Higgins is a 70 y.o. male is here for a screening colonoscopy.   Past Medical History:  Diagnosis Date  . Anemia    in past  . CAD (coronary artery disease)    Cardiac catheterization done in July 2015 in [place] by Dr. Jori Moll fields showed mild nonobstructive disease in the mid LAD and distal left circumflex with normal ejection fraction.  . Diabetes mellitus without complication ([place])   . Hypertension   . Hypothyroidism   . Thyroid disease     Past Surgical History:  Procedure Laterality Date  . COLONOSCOPY    . COLONOSCOPY WITH PROPOFOL N/A 09/20/2015   Procedure: COLONOSCOPY WITH PROPOFOL;  Surgeon: Lucilla Lame, MD;  Location: [place];  Service: Endoscopy;  Laterality: N/A;  . POLYPECTOMY  09/20/2015   Procedure: POLYPECTOMY INTESTINAL;  Surgeon: Lucilla Lame, MD;  Location: [place];  Service: Endoscopy;;  Sigmoid colon polyp x 1 Rectal polyp x 2    Prior to Admission medications   Medication Sig Start Date End Date Taking? Authorizing Provider  ezetimibe (ZETIA) 10 MG tablet Take 1 tablet (10 mg total) by mouth daily. 04/20/20 07/19/20 Yes Wellington Hampshire, MD  glipiZIDE (GLUCOTROL) 5 MG tablet Take 1 tablet (5 mg total) by mouth 2 (two) times daily. 05/31/20  Yes Luiz Ochoa, NP  levothyroxine (SYNTHROID) 75 MCG tablet Take 1 tablet (75 mcg total) by mouth daily before breakfast. 02/09/20  Yes Boscia, Heather E, NP  ramipril (ALTACE) 2.5 MG capsule Take 1 capsule (2.5 mg total) by mouth daily. 04/02/20  Yes Ronnell Freshwater, NP    Allergies as of 05/19/2020 - Review Complete 05/18/2020  Allergen Reaction Noted  . Levothyroxine  06/14/2017    History reviewed. No pertinent family  history.  Social History   Socioeconomic History  . Marital status: Married    Spouse name: Not on file  . Number of children: Not on file  . Years of education: Not on file  . Highest education level: Not on file  Occupational History  . Not on file  Tobacco Use  . Smoking status: Former Research scientist (life sciences)  . Smokeless tobacco: Never Used  . Tobacco comment: quit approx 2013  Vaping Use  . Vaping Use: Never used  Substance and Sexual Activity  . Alcohol use: No  . Drug use: No  . Sexual activity: Not on file  Other Topics Concern  . Not on file  Social History Narrative  . Not on file   Social Determinants of Health   Financial Resource Strain: Not on file  Food Insecurity: Not on file  Transportation Needs: Not on file  Physical Activity: Not on file  Stress: Not on file  Social Connections: Not on file  Intimate Partner Violence: Not on file    Review of Systems: See HPI, otherwise negative ROS  Physical Exam: BP (!) 150/80   Pulse (!) 52   Temp (!) 97.3 F (36.3 C) (Temporal)   Resp 16   Ht 5\' 5"  (1.651 m)   Wt 73.5 kg   SpO2 99%   BMI 26.96 kg/m  General:   Alert,  pleasant and cooperative in NAD Head:  Normocephalic and atraumatic. Neck:  Supple; no masses or thyromegaly. Lungs:  Clear throughout  to auscultation.    Heart:  Regular rate and rhythm. Abdomen:  Soft, nontender and nondistended. Normal bowel sounds, without guarding, and without rebound.   Neurologic:  Alert and  oriented x4;  grossly normal neurologically.  Impression/Plan: Travis Higgins is now here to undergo a screening colonoscopy.  Risks, benefits, and alternatives regarding colonoscopy have been reviewed with the patient.  Questions have been answered.  All parties agreeable.

## 2020-07-05 NOTE — Anesthesia Procedure Notes (Signed)
Date/Time: 07/05/2020 7:57 AM Performed by: Dionne Bucy, CRNA Pre-anesthesia Checklist: Patient identified, Emergency Drugs available, Suction available, Patient being monitored and Timeout performed Oxygen Delivery Method: Nasal cannula Placement Confirmation: positive ETCO2

## 2020-07-05 NOTE — Transfer of Care (Signed)
Immediate Anesthesia Transfer of Care Note  Patient: Travis Higgins  Procedure(s) Performed: COLONOSCOPY WITH PROPOFOL (N/A Rectum) POLYPECTOMY (Rectum)  Patient Location: PACU  Anesthesia Type: General  Level of Consciousness: awake, alert  and patient cooperative  Airway and Oxygen Therapy: Patient Spontanous Breathing and Patient connected to supplemental oxygen  Post-op Assessment: Post-op Vital signs reviewed, Patient's Cardiovascular Status Stable, Respiratory Function Stable, Patent Airway and No signs of Nausea or vomiting  Post-op Vital Signs: Reviewed and stable  Complications: No complications documented.

## 2020-07-06 ENCOUNTER — Encounter: Payer: Self-pay | Admitting: Gastroenterology

## 2020-07-06 LAB — SURGICAL PATHOLOGY

## 2020-08-30 ENCOUNTER — Ambulatory Visit: Payer: Medicare Other | Admitting: Hospice and Palliative Medicine

## 2020-08-31 ENCOUNTER — Ambulatory Visit: Payer: Medicare Other | Admitting: Hospice and Palliative Medicine

## 2020-09-03 NOTE — Progress Notes (Signed)
MRN : [id number]  Travis Higgins is a 70 y.o. (August 17, 1950) male who presents with chief complaint of No chief complaint on file. Marland Kitchen  History of Present Illness:   The patient is seen for follow up evaluation of carotid stenosis. The carotid stenosis followed by ultrasound.   The patient denies amaurosis fugax. There is no recent history of TIA symptoms or focal motor deficits. There is no prior documented CVA.  The patient is taking enteric-coated aspirin 81 mg daily.  There is no history of migraine headaches. There is no history of seizures.  The patient has a history of coronary artery disease, no recent episodes of angina or shortness of breath. The patient denies PAD or claudication symptoms. There is a history of hyperlipidemia which is being treated with a statin.   Previous CT of the neck Dated February 18, 2020 is reviewed by me.  It is also shown to the patient and his wife.  Although the radiologist read is 70% for the right internal carotid artery by my measurement is closer to 55 to 60%.  Left side is less than 50%.   Carotid Duplex done today shows RICA 40-98% and LICA <11%.  No change compared to last study.  No outpatient medications have been marked as taking for the 09/06/20 encounter (Appointment) with Delana Meyer, Dolores Lory, MD.    Past Medical History:  Diagnosis Date  . Anemia    in past  . CAD (coronary artery disease)    Cardiac catheterization done in July 2015 in [place] by Dr. Jori Moll fields showed mild nonobstructive disease in the mid LAD and distal left circumflex with normal ejection fraction.  . Diabetes mellitus without complication ([place])   . Hypertension   . Hypothyroidism   . Thyroid disease     Past Surgical History:  Procedure Laterality Date  . COLONOSCOPY    . COLONOSCOPY WITH PROPOFOL N/A 09/20/2015   Procedure: COLONOSCOPY WITH PROPOFOL;  Surgeon: Lucilla Lame, MD;  Location: [place];  Service: Endoscopy;  Laterality:  N/A;  . COLONOSCOPY WITH PROPOFOL N/A 07/05/2020   Procedure: COLONOSCOPY WITH PROPOFOL;  Surgeon: Lucilla Lame, MD;  Location: [place];  Service: Endoscopy;  Laterality: N/A;  Diabetic - oral meds  . POLYPECTOMY  09/20/2015   Procedure: POLYPECTOMY INTESTINAL;  Surgeon: Lucilla Lame, MD;  Location: [place];  Service: Endoscopy;;  Sigmoid colon polyp x 1 Rectal polyp x 2  . POLYPECTOMY  07/05/2020   Procedure: POLYPECTOMY;  Surgeon: Lucilla Lame, MD;  Location: [facility];  Service: Endoscopy;;    Social History Social History   Tobacco Use  . Smoking status: Former Research scientist (life sciences)  . Smokeless tobacco: Never Used  . Tobacco comment: quit approx 2013  Vaping Use  . Vaping Use: Never used  Substance Use Topics  . Alcohol use: No  . Drug use: No    Family History No family history on file.  Allergies  Allergen Reactions  . Levothyroxine      REVIEW OF SYSTEMS (Negative unless checked)  Constitutional: [] Weight loss  [] Fever  [] Chills Cardiac: [] Chest pain   [] Chest pressure   [] Palpitations   [] Shortness of breath when laying flat   [] Shortness of breath with exertion. Vascular:  [] Pain in legs with walking   [] Pain in legs at rest  [] History of DVT   [] Phlebitis   [] Swelling in legs   [] Varicose veins   [] Non-healing ulcers Pulmonary:   [] Uses home oxygen   [] Productive cough   []   Hemoptysis   [] Wheeze  [] COPD   [] Asthma Neurologic:  [] Dizziness   [] Seizures   [] History of stroke   [] History of TIA  [] Aphasia   [] Vissual changes   [] Weakness or numbness in arm   [] Weakness or numbness in leg Musculoskeletal:   [] Joint swelling   [] Joint pain   [] Low back pain Hematologic:  [] Easy bruising  [] Easy bleeding   [] Hypercoagulable state   [] Anemic Gastrointestinal:  [] Diarrhea   [] Vomiting  [] Gastroesophageal reflux/heartburn   [] Difficulty swallowing. Genitourinary:  [] Chronic kidney disease   [] Difficult urination  [] Frequent urination   [] Blood in  urine Skin:  [] Rashes   [] Ulcers  Psychological:  [] History of anxiety   []  History of major depression.  Physical Examination  There were no vitals filed for this visit. There is no height or weight on file to calculate BMI. Gen: WD/WN, NAD Head: [place]/AT, No temporalis wasting.  Ear/Nose/Throat: Hearing grossly intact, nares w/o erythema or drainage Eyes: PER, EOMI, sclera nonicteric.  Neck: Supple, no large masses.   Pulmonary:  Good air movement, no audible wheezing bilaterally, no use of accessory muscles.  Cardiac: RRR, no JVD Vascular:  Vessel Right Left  Radial Palpable Palpable  Carotid Palpable Palpable  Gastrointestinal: Non-distended. No guarding/no peritoneal signs.  Musculoskeletal: M/S 5/5 throughout.  No deformity or atrophy.  Neurologic: CN 2-12 intact. Symmetrical.  Speech is fluent. Motor exam as listed above. Psychiatric: Judgment intact, Mood & affect appropriate for pt's clinical situation. Dermatologic: No rashes or ulcers noted.  No changes consistent with cellulitis.   CBC Lab Results  Component Value Date   WBC 4.6 02/14/2020   HGB 13.2 02/14/2020   HCT 41.5 02/14/2020   MCV 84.2 02/14/2020   PLT 205 02/14/2020    BMET    Component Value Date/Time   NA 136 02/14/2020 0739   K 4.3 02/14/2020 0739   CL 103 02/14/2020 0739   CO2 27 02/14/2020 0739   GLUCOSE 67 (L) 02/14/2020 0739   BUN 14 02/14/2020 0739   CREATININE 0.90 02/14/2020 0739   CALCIUM 9.1 02/14/2020 0739   GFRNONAA >60 02/14/2020 0739   GFRAA >60 02/14/2020 0739   CrCl cannot be calculated (Patient's most recent lab result is older than the maximum 21 days allowed.).  COAG No results found for: INR, PROTIME  Radiology No results found.   Assessment/Plan 1. Bilateral carotid artery stenosis Recommend:  Given the patient's asymptomatic subcritical stenosis no further invasive testing or surgery at this time.  Carotid Duplex done today shows RICA 78-29% and LICA <56%.  No  change compared to last study.  Continue antiplatelet therapy as prescribed Continue management of CAD, HTN and Hyperlipidemia Healthy heart diet,  encouraged exercise at least 4 times per week Follow up in 6 months with duplex ultrasound and physical exam.   - VAS US CAROTID; Future  2. Essential hypertension Continue antihypertensive medications as already ordered, these medications have been reviewed and there are no changes at this time.   3. Type 2 diabetes mellitus with diabetic neuropathy, unspecified whether long term insulin use ([place]) Continue hypoglycemic medications as already ordered, these medications have been reviewed and there are no changes at this time.  Hgb A1C to be monitored as already arranged by primary service   4. Mixed hyperlipidemia Continue statin as ordered and reviewed, no changes at this time    Hortencia Pilar, MD  09/03/2020 4:42 PM

## 2020-09-06 ENCOUNTER — Other Ambulatory Visit: Payer: Self-pay

## 2020-09-06 ENCOUNTER — Encounter: Payer: Self-pay | Admitting: Hospice and Palliative Medicine

## 2020-09-06 ENCOUNTER — Ambulatory Visit (INDEPENDENT_AMBULATORY_CARE_PROVIDER_SITE_OTHER): Payer: Medicare Other

## 2020-09-06 ENCOUNTER — Ambulatory Visit (INDEPENDENT_AMBULATORY_CARE_PROVIDER_SITE_OTHER): Payer: Medicare Other | Admitting: Hospice and Palliative Medicine

## 2020-09-06 ENCOUNTER — Ambulatory Visit (INDEPENDENT_AMBULATORY_CARE_PROVIDER_SITE_OTHER): Payer: Medicare Other | Admitting: Vascular Surgery

## 2020-09-06 ENCOUNTER — Encounter (INDEPENDENT_AMBULATORY_CARE_PROVIDER_SITE_OTHER): Payer: Self-pay | Admitting: Vascular Surgery

## 2020-09-06 VITALS — BP 133/78 | HR 60 | Ht 65.0 in | Wt 169.0 lb

## 2020-09-06 VITALS — BP 138/82 | HR 60 | Temp 97.4°F | Resp 16 | Ht 65.0 in | Wt 171.0 lb

## 2020-09-06 DIAGNOSIS — E1165 Type 2 diabetes mellitus with hyperglycemia: Secondary | ICD-10-CM

## 2020-09-06 DIAGNOSIS — I10 Essential (primary) hypertension: Secondary | ICD-10-CM

## 2020-09-06 DIAGNOSIS — E1140 Type 2 diabetes mellitus with diabetic neuropathy, unspecified: Secondary | ICD-10-CM | POA: Diagnosis not present

## 2020-09-06 DIAGNOSIS — K219 Gastro-esophageal reflux disease without esophagitis: Secondary | ICD-10-CM | POA: Diagnosis not present

## 2020-09-06 DIAGNOSIS — I6523 Occlusion and stenosis of bilateral carotid arteries: Secondary | ICD-10-CM

## 2020-09-06 DIAGNOSIS — J302 Other seasonal allergic rhinitis: Secondary | ICD-10-CM | POA: Diagnosis not present

## 2020-09-06 DIAGNOSIS — E782 Mixed hyperlipidemia: Secondary | ICD-10-CM

## 2020-09-06 LAB — POCT GLYCOSYLATED HEMOGLOBIN (HGB A1C): Hemoglobin A1C: 7.2 % — AB (ref 4.0–5.6)

## 2020-09-06 MED ORDER — OMEPRAZOLE 40 MG PO CPDR: 40.0000 mg | DELAYED_RELEASE_CAPSULE | Freq: Two times a day (BID) | ORAL | 1 refills | Status: DC

## 2020-09-06 NOTE — Progress Notes (Signed)
[facility] [address], [place] [address]  Internal MEDICINE  Office Visit Note  Patient Name: Travis Higgins  [id number]  [id number]  Date of Service: 09/06/2020  Chief Complaint  Patient presents with  . Follow-up    Discuss meds  . Diabetes  . Hypertension  . Anemia  . Quality Metric Gaps    Eye exam scheduled     HPI Patient is here for routine follow-up DM-aware that A1C will be elevated today as his diet recently has been poor, his wife whom is responsible for cooking has had recent wrist surgery and has been unable to cook the last several weeks, relying on freezer and bought meals  Requesting refills of omeprazole, was originally prescribed by GI for reflux, was discontinued and trial of Dexilant was given, unable to afford out of pocket cost of Dexilant and feels his symptoms were well controlled on PPI  Complaining of rhinorrhea and PND today, not currently taking any medications for allergies   Current Medication: Outpatient Encounter Medications as of 09/06/2020  Medication Sig  . glipiZIDE (GLUCOTROL) 5 MG tablet Take 1 tablet (5 mg total) by mouth 2 (two) times daily.  Marland Kitchen levothyroxine (SYNTHROID) 75 MCG tablet Take 1 tablet (75 mcg total) by mouth daily before breakfast.  . omeprazole (PRILOSEC) 40 MG capsule Take 1 capsule (40 mg total) by mouth in the morning and at bedtime.  . ramipril (ALTACE) 2.5 MG capsule Take 1 capsule (2.5 mg total) by mouth daily.  Marland Kitchen ezetimibe (ZETIA) 10 MG tablet Take 1 tablet (10 mg total) by mouth daily.   No facility-administered encounter medications on file as of 09/06/2020.    Surgical History: Past Surgical History:  Procedure Laterality Date  . COLONOSCOPY    . COLONOSCOPY WITH PROPOFOL N/A 09/20/2015   Procedure: COLONOSCOPY WITH PROPOFOL;  Surgeon: Lucilla Lame, MD;  Location: [place];  Service: Endoscopy;  Laterality: N/A;  . COLONOSCOPY WITH PROPOFOL N/A 07/05/2020   Procedure:  COLONOSCOPY WITH PROPOFOL;  Surgeon: Lucilla Lame, MD;  Location: [place];  Service: Endoscopy;  Laterality: N/A;  Diabetic - oral meds  . POLYPECTOMY  09/20/2015   Procedure: POLYPECTOMY INTESTINAL;  Surgeon: Lucilla Lame, MD;  Location: [place];  Service: Endoscopy;;  Sigmoid colon polyp x 1 Rectal polyp x 2  . POLYPECTOMY  07/05/2020   Procedure: POLYPECTOMY;  Surgeon: Lucilla Lame, MD;  Location: [place];  Service: Endoscopy;;    Medical History: Past Medical History:  Diagnosis Date  . Anemia    in past  . CAD (coronary artery disease)    Cardiac catheterization done in July 2015 in [place] by Dr. Jori Moll fields showed mild nonobstructive disease in the mid LAD and distal left circumflex with normal ejection fraction.  . Diabetes mellitus without complication ([place])   . Hypertension   . Hypothyroidism   . Thyroid disease     Family History: Family History  Family history unknown: Yes    Social History   Socioeconomic History  . Marital status: Married    Spouse name: Not on file  . Number of children: Not on file  . Years of education: Not on file  . Highest education level: Not on file  Occupational History  . Not on file  Tobacco Use  . Smoking status: Former Research scientist (life sciences)  . Smokeless tobacco: Never Used  . Tobacco comment: quit approx 2013  Vaping Use  . Vaping Use: Never used  Substance and Sexual Activity  .  Alcohol use: No  . Drug use: No  . Sexual activity: Not on file  Other Topics Concern  . Not on file  Social History Narrative  . Not on file   Social Determinants of Health   Financial Resource Strain: Not on file  Food Insecurity: Not on file  Transportation Needs: Not on file  Physical Activity: Not on file  Stress: Not on file  Social Connections: Not on file  Intimate Partner Violence: Not on file      Review of Systems  Constitutional: Negative for chills, fatigue and unexpected weight change.  HENT:  Positive for postnasal drip and rhinorrhea. Negative for congestion, sneezing and sore throat.   Eyes: Negative for redness.  Respiratory: Negative for cough, chest tightness and shortness of breath.   Cardiovascular: Negative for chest pain and palpitations.  Gastrointestinal: Negative for abdominal pain, constipation, diarrhea, nausea and vomiting.  Genitourinary: Negative for dysuria and frequency.  Musculoskeletal: Negative for arthralgias, back pain, joint swelling and neck pain.  Skin: Negative for rash.  Neurological: Negative for tremors and numbness.  Hematological: Negative for adenopathy. Does not bruise/bleed easily.  Psychiatric/Behavioral: Negative for behavioral problems (Depression), sleep disturbance and suicidal ideas. The patient is not nervous/anxious.     Vital Signs: BP 138/82   Pulse 60   Temp (!) 97.4 F (36.3 C)   Resp 16   Ht 5\' 5"  (1.651 m)   Wt 171 lb (77.6 kg)   SpO2 99%   BMI 28.46 kg/m    Physical Exam Vitals reviewed.  Constitutional:      Appearance: Normal appearance. He is normal weight.  HENT:     Right Ear: Tympanic membrane normal.     Left Ear: Tympanic membrane normal.     Nose: Rhinorrhea present.     Mouth/Throat:     Mouth: Mucous membranes are moist.     Pharynx: Oropharynx is clear.  Cardiovascular:     Rate and Rhythm: Normal rate and regular rhythm.     Pulses: Normal pulses.     Heart sounds: Normal heart sounds.  Pulmonary:     Effort: Pulmonary effort is normal.     Breath sounds: Normal breath sounds.  Abdominal:     General: Abdomen is flat.     Palpations: Abdomen is soft.  Musculoskeletal:        General: Normal range of motion.     Cervical back: Normal range of motion.  Skin:    General: Skin is warm.  Neurological:     General: No focal deficit present.     Mental Status: He is alert and oriented to person, place, and time. Mental status is at baseline.  Psychiatric:        Mood and Affect: Mood normal.         Behavior: Behavior normal.        Thought Content: Thought content normal.        Judgment: Judgment normal.    Assessment/Plan: 1. Type 2 diabetes mellitus with hyperglycemia, without long-term current use of insulin (HCC) A1C 7.2--elevated from last visit Discussed additional medication therapies to help lower A1C--he defers at this time as he feels his levels will improve once he is able to get his diet back on track - POCT HgB A1C  2. Gastroesophageal reflux disease without esophagitis Requesting refills of omeprazole, continue to monitor - omeprazole (PRILOSEC) 40 MG capsule; Take 1 capsule (40 mg total) by mouth in the morning and at bedtime.  Dispense: 180 capsule; Refill: 1  3. Essential hypertension BP and HR well controlled today, continue with routine monitoring  4. Seasonal allergies Sample of Zyrtec given in office today, advised to buy generic OTC and take daily through spring season, if symptoms persist or worsen will optimize therapy  General Counseling: Finn verbalizes understanding of the findings of todays visit and agrees with plan of treatment. I have discussed any further diagnostic evaluation that may be needed or ordered today. We also reviewed his medications today. he has been encouraged to call the office with any questions or concerns that should arise related to todays visit.    Orders Placed This Encounter  Procedures  . POCT HgB A1C    Meds ordered this encounter  Medications  . omeprazole (PRILOSEC) 40 MG capsule    Sig: Take 1 capsule (40 mg total) by mouth in the morning and at bedtime.    Dispense:  180 capsule    Refill:  1    Time spent: 30 Minutes Time spent includes review of chart, medications, test results and follow-up plan with the patient.  This patient was seen by Theodoro Grist AGNP-C in Collaboration with Dr Lavera Guise as a part of collaborative care agreement     Tanna Furry. Alessandria Henken AGNP-C Internal medicine

## 2020-09-07 ENCOUNTER — Encounter: Payer: Self-pay | Admitting: Hospice and Palliative Medicine

## 2020-10-04 ENCOUNTER — Other Ambulatory Visit: Payer: Self-pay | Admitting: Nurse Practitioner

## 2020-10-04 DIAGNOSIS — E1165 Type 2 diabetes mellitus with hyperglycemia: Secondary | ICD-10-CM

## 2020-10-04 DIAGNOSIS — I10 Essential (primary) hypertension: Secondary | ICD-10-CM

## 2020-10-12 ENCOUNTER — Other Ambulatory Visit: Payer: Self-pay

## 2020-10-12 DIAGNOSIS — I10 Essential (primary) hypertension: Secondary | ICD-10-CM

## 2020-10-12 DIAGNOSIS — E1165 Type 2 diabetes mellitus with hyperglycemia: Secondary | ICD-10-CM

## 2020-10-12 MED ORDER — RAMIPRIL 2.5 MG PO CAPS: 2.5000 mg | ORAL_CAPSULE | Freq: Every day | ORAL | 1 refills | Status: DC

## 2020-10-27 ENCOUNTER — Telehealth: Payer: Self-pay | Admitting: Internal Medicine

## 2020-10-27 NOTE — Progress Notes (Signed)
  Chronic Care Management   Outreach Note  10/27/2020 Name: Travis Higgins MRN: [id number] DOB: Oct 31, 1950  Referred by: Lavera Guise, MD Reason for referral : No chief complaint on file.   A second unsuccessful telephone outreach was attempted today. The patient was referred to pharmacist for assistance with care management and care coordination.  Follow Up Plan:   [place]

## 2020-11-03 ENCOUNTER — Telehealth: Payer: Self-pay | Admitting: Internal Medicine

## 2020-11-03 NOTE — Progress Notes (Signed)
  Chronic Care Management   Outreach Note  11/03/2020 Name: Travis Higgins MRN: [id number] DOB: 05-Jan-1951  Referred by: Lavera Guise, MD Reason for referral : No chief complaint on file.   A second unsuccessful telephone outreach was attempted today. The patient was referred to pharmacist for assistance with care management and care coordination.  Follow Up Plan:   Tatjana Dellinger [employer] scheduler

## 2020-11-23 ENCOUNTER — Ambulatory Visit: Payer: Self-pay | Admitting: Internal Medicine

## 2020-11-30 DIAGNOSIS — E119 Type 2 diabetes mellitus without complications: Secondary | ICD-10-CM | POA: Diagnosis not present

## 2020-12-10 ENCOUNTER — Ambulatory Visit (INDEPENDENT_AMBULATORY_CARE_PROVIDER_SITE_OTHER): Payer: Medicare Other | Admitting: Physician Assistant

## 2020-12-10 ENCOUNTER — Encounter: Payer: Self-pay | Admitting: Physician Assistant

## 2020-12-10 ENCOUNTER — Other Ambulatory Visit: Payer: Self-pay

## 2020-12-10 DIAGNOSIS — E1165 Type 2 diabetes mellitus with hyperglycemia: Secondary | ICD-10-CM

## 2020-12-10 DIAGNOSIS — I10 Essential (primary) hypertension: Secondary | ICD-10-CM

## 2020-12-10 DIAGNOSIS — K219 Gastro-esophageal reflux disease without esophagitis: Secondary | ICD-10-CM

## 2020-12-10 DIAGNOSIS — E782 Mixed hyperlipidemia: Secondary | ICD-10-CM

## 2020-12-10 LAB — POCT GLYCOSYLATED HEMOGLOBIN (HGB A1C): Hemoglobin A1C: 7 % — AB (ref 4.0–5.6)

## 2020-12-10 MED ORDER — GLIPIZIDE 5 MG PO TABS: 5.0000 mg | ORAL_TABLET | Freq: Two times a day (BID) | ORAL | 1 refills | Status: DC

## 2020-12-10 NOTE — Progress Notes (Signed)
[facility], [place] [address]  Internal MEDICINE  Office Visit Note  Patient Name: Travis Higgins  [id number]  [id number]  Date of Service: 12/14/2020  Chief Complaint  Patient presents with   Follow-up   Hypertension   Diabetes   Anemia   Quality Metric Gaps    Covid booster, shingrix    HPI Pt is here for routine follow up and has no complaints today. His wife is with him today. -He does not check his BP or BG at home and discussed that I would like him to start monitoring these at home. -Admits to not eating the best lately, but will work on getting back on track -Walking 1.5-37miles every day -Continues to take all of his medications as prescribed  Current Medication: Outpatient Encounter Medications as of 12/10/2020  Medication Sig   levothyroxine (SYNTHROID) 75 MCG tablet Take 1 tablet (75 mcg total) by mouth daily before breakfast.   omeprazole (PRILOSEC) 40 MG capsule Take 1 capsule (40 mg total) by mouth in the morning and at bedtime.   ramipril (ALTACE) 2.5 MG capsule Take 1 capsule (2.5 mg total) by mouth daily.   [DISCONTINUED] glipiZIDE (GLUCOTROL) 5 MG tablet Take 1 tablet (5 mg total) by mouth 2 (two) times daily.   ezetimibe (ZETIA) 10 MG tablet Take 1 tablet (10 mg total) by mouth daily.   glipiZIDE (GLUCOTROL) 5 MG tablet Take 1 tablet (5 mg total) by mouth 2 (two) times daily.   No facility-administered encounter medications on file as of 12/10/2020.    Surgical History: Past Surgical History:  Procedure Laterality Date   COLONOSCOPY     COLONOSCOPY WITH PROPOFOL N/A 09/20/2015   Procedure: COLONOSCOPY WITH PROPOFOL;  Surgeon: Lucilla Lame, MD;  Location: [place];  Service: Endoscopy;  Laterality: N/A;   COLONOSCOPY WITH PROPOFOL N/A 07/05/2020   Procedure: COLONOSCOPY WITH PROPOFOL;  Surgeon: Lucilla Lame, MD;  Location: [place];  Service: Endoscopy;  Laterality: N/A;  Diabetic - oral meds    POLYPECTOMY  09/20/2015   Procedure: POLYPECTOMY INTESTINAL;  Surgeon: Lucilla Lame, MD;  Location: [place];  Service: Endoscopy;;  Sigmoid colon polyp x 1 Rectal polyp x 2   POLYPECTOMY  07/05/2020   Procedure: POLYPECTOMY;  Surgeon: Lucilla Lame, MD;  Location: [place];  Service: Endoscopy;;    Medical History: Past Medical History:  Diagnosis Date   Anemia    in past   CAD (coronary artery disease)    Cardiac catheterization done in July 2015 in [place] by Dr. Jori Moll fields showed mild nonobstructive disease in the mid LAD and distal left circumflex with normal ejection fraction.   Diabetes mellitus without complication ([place])    Hypertension    Hypothyroidism    Thyroid disease     Family History: Family History  Family history unknown: Yes    Social History   Socioeconomic History   Marital status: Married    Spouse name: Not on file   Number of children: Not on file   Years of education: Not on file   Highest education level: Not on file  Occupational History   Not on file  Tobacco Use   Smoking status: Former    Pack years: 0.00   Smokeless tobacco: Never   Tobacco comments:    quit approx 2013  Vaping Use   Vaping Use: Never used  Substance and Sexual Activity   Alcohol use: No   Drug use: No  Sexual activity: Not on file  Other Topics Concern   Not on file  Social History Narrative   Not on file   Social Determinants of Health   Financial Resource Strain: Not on file  Food Insecurity: Not on file  Transportation Needs: Not on file  Physical Activity: Not on file  Stress: Not on file  Social Connections: Not on file  Intimate Partner Violence: Not on file      Review of Systems  Constitutional:  Negative for chills, fatigue and unexpected weight change.  HENT:  Negative for congestion, postnasal drip, rhinorrhea, sneezing and sore throat.   Eyes:  Negative for redness.  Respiratory:  Negative for cough, chest  tightness and shortness of breath.   Cardiovascular:  Negative for chest pain and palpitations.  Gastrointestinal:  Negative for abdominal pain, constipation, diarrhea, nausea and vomiting.  Genitourinary:  Negative for dysuria and frequency.  Musculoskeletal:  Negative for arthralgias, back pain, joint swelling and neck pain.  Skin:  Negative for rash.  Neurological: Negative.  Negative for tremors and numbness.  Hematological:  Negative for adenopathy. Does not bruise/bleed easily.  Psychiatric/Behavioral:  Negative for behavioral problems (Depression), sleep disturbance and suicidal ideas. The patient is not nervous/anxious.    Vital Signs: BP (!) 142/78   Pulse 60   Temp (!) 97.3 F (36.3 C)   Resp 16   Ht 5\' 5"  (1.651 m)   Wt 170 lb 12.8 oz (77.5 kg)   SpO2 98%   BMI 28.42 kg/m    Physical Exam Vitals and nursing note reviewed.  Constitutional:      General: He is not in acute distress.    Appearance: He is well-developed. He is not diaphoretic.  HENT:     Head: Normocephalic and atraumatic.     Mouth/Throat:     Pharynx: No oropharyngeal exudate.  Eyes:     Pupils: Pupils are equal, round, and reactive to light.  Neck:     Thyroid: No thyromegaly.     Vascular: No JVD.     Trachea: No tracheal deviation.  Cardiovascular:     Rate and Rhythm: Normal rate and regular rhythm.     Heart sounds: Normal heart sounds. No murmur heard.   No friction rub. No gallop.  Pulmonary:     Effort: Pulmonary effort is normal. No respiratory distress.     Breath sounds: No wheezing or rales.  Chest:     Chest wall: No tenderness.  Abdominal:     General: Bowel sounds are normal.     Palpations: Abdomen is soft.  Musculoskeletal:        General: Normal range of motion.     Cervical back: Normal range of motion and neck supple.  Lymphadenopathy:     Cervical: No cervical adenopathy.  Skin:    General: Skin is warm and dry.  Neurological:     Mental Status: He is alert and  oriented to person, place, and time.     Cranial Nerves: No cranial nerve deficit.  Psychiatric:        Behavior: Behavior normal.        Thought Content: Thought content normal.        Judgment: Judgment normal.       Assessment/Plan: 1. Type 2 diabetes mellitus with hyperglycemia, without long-term current use of insulin (HCC) - POCT HgB A1C is 7.0 which is improving from 7.2 last visit. Will continue glipizide and have him continue to work on improving diet  and exercise. Will also have him start monitoring BG at home. - glipiZIDE (GLUCOTROL) 5 MG tablet; Take 1 tablet (5 mg total) by mouth 2 (two) times daily.  Dispense: 180 tablet; Refill: 1  2. Essential hypertension Stable, continue ramipril  3. Mixed hyperlipidemia Continue zetia  4. Gastroesophageal reflux disease without esophagitis Stable, continue omeprazole   General Counseling: Curties verbalizes understanding of the findings of todays visit and agrees with plan of treatment. I have discussed any further diagnostic evaluation that may be needed or ordered today. We also reviewed his medications today. he has been encouraged to call the office with any questions or concerns that should arise related to todays visit.    Orders Placed This Encounter  Procedures   POCT HgB A1C    Meds ordered this encounter  Medications   glipiZIDE (GLUCOTROL) 5 MG tablet    Sig: Take 1 tablet (5 mg total) by mouth 2 (two) times daily.    Dispense:  180 tablet    Refill:  1    Update to BID dosing.     This patient was seen by Drema Dallas, PA-C in collaboration with Dr. Clayborn Bigness as a part of collaborative care agreement.   Total time spent:30 Minutes Time spent includes review of chart, medications, test results, and follow up plan with the patient.      Dr Lavera Guise Internal medicine

## 2020-12-18 IMAGING — CR DG LUMBAR SPINE COMPLETE 4+V
1 series · 5 of 5 positions shown · non-contrast
Comparison: None.

CLINICAL DATA: 67-year-old presenting with 1 week history of pain
involving the entire spine. Cervical neck pain, especially when
turning his head. Interscapular UPPER back pain. Chronic low back
pain which has acutely worsened in the past week.

EXAM:
LUMBAR SPINE - COMPLETE 4+ VIEW

[Series 1: dg lumbar spine complete 4 +v · 0.14mm/px · 5 of 5 slices shown]
[im 1/5]
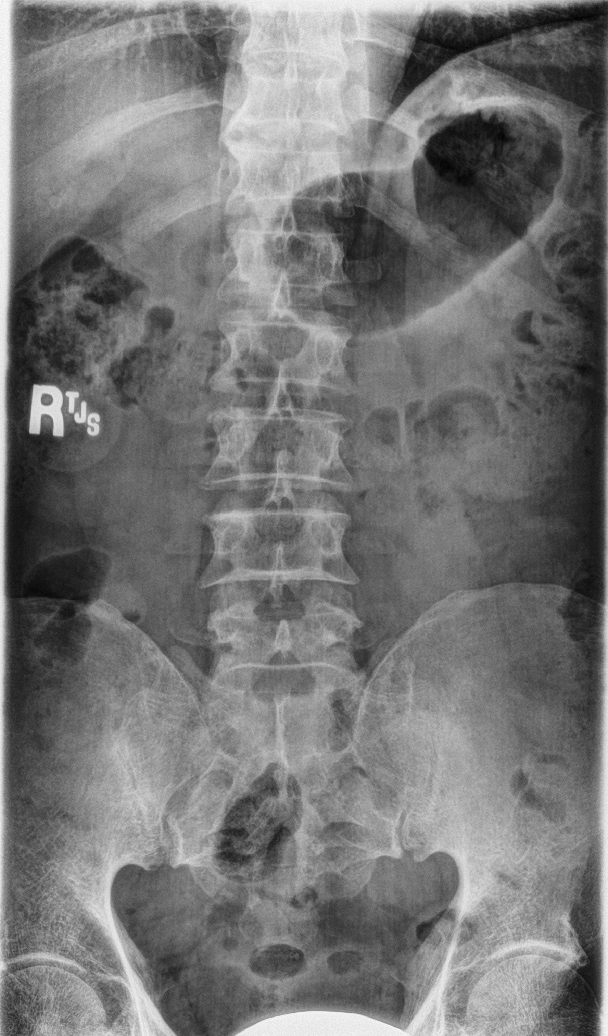
[im 2/5]
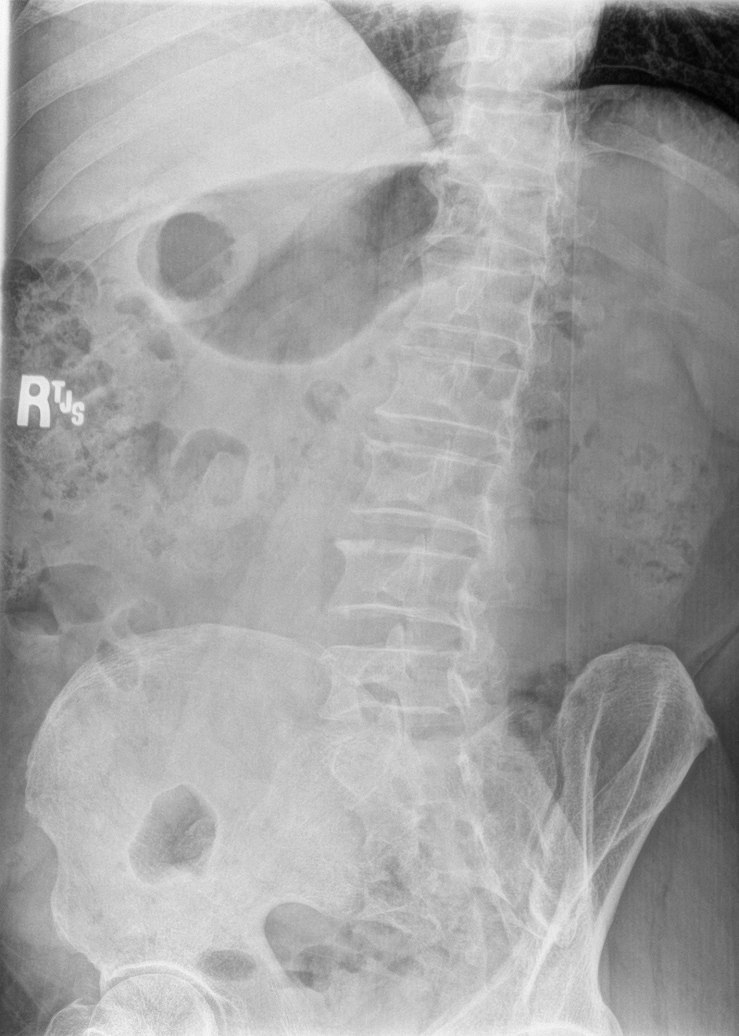
[im 3/5]
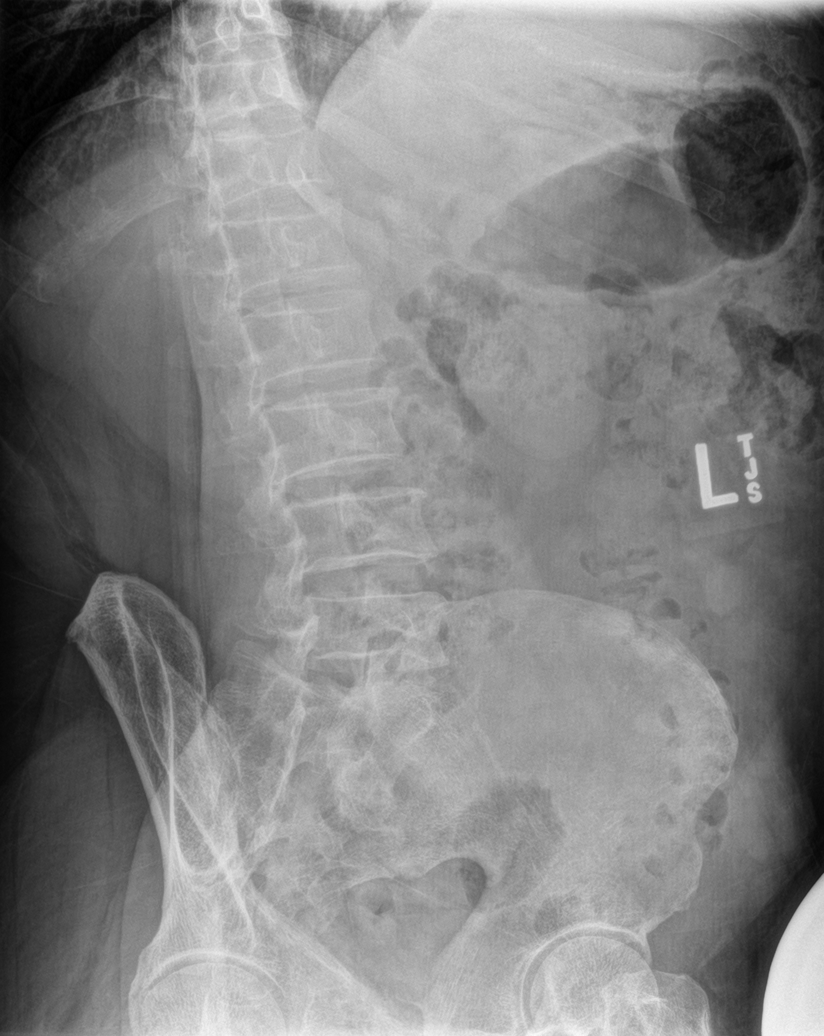
[im 4/5]
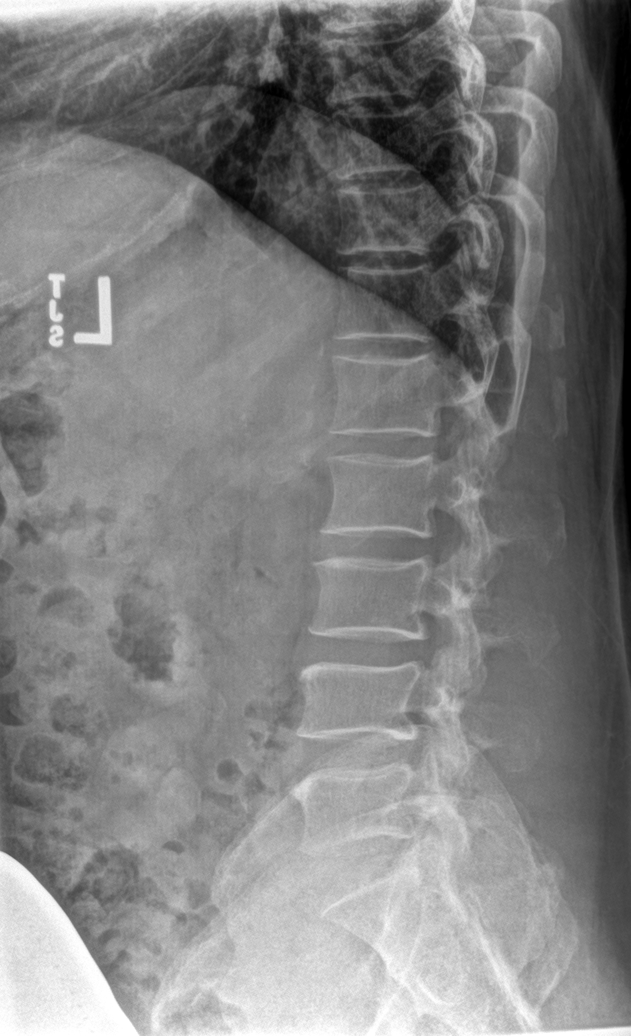
[im 5/5]
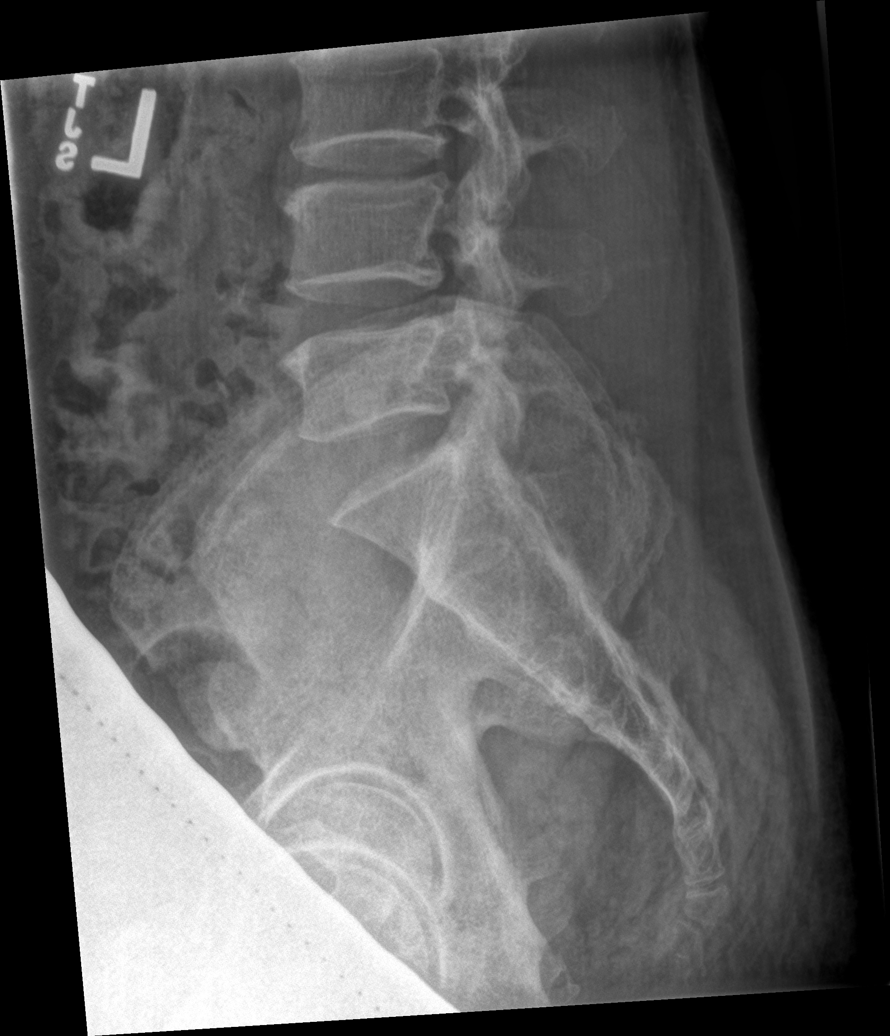

[5 of 5 positions shown; findings below may reference images not displayed]

FINDINGS: Five non-rib-bearing lumbar vertebrae with anatomic alignment.
Straightening of the usual lumbar lordosis. No fractures. Minimal
spondylosis involving the endplates adjacent to the L3-4 and L4-5
discs. No pars defects. No significant facet arthropathy. Sacroiliac
joints intact.
IMPRESSION: Straightening of the usual lordosis which may reflect positioning
and/or spasm. No significant abnormality otherwise.

## 2021-01-10 ENCOUNTER — Other Ambulatory Visit: Payer: Self-pay | Admitting: Nurse Practitioner

## 2021-01-10 ENCOUNTER — Other Ambulatory Visit: Payer: Self-pay

## 2021-01-10 DIAGNOSIS — E039 Hypothyroidism, unspecified: Secondary | ICD-10-CM

## 2021-01-10 MED ORDER — LEVOTHYROXINE SODIUM 75 MCG PO TABS: 75.0000 ug | ORAL_TABLET | Freq: Every day | ORAL | 1 refills | Status: DC

## 2021-02-10 ENCOUNTER — Ambulatory Visit: Payer: Medicare Other | Admitting: Physician Assistant

## 2021-04-08 ENCOUNTER — Other Ambulatory Visit (INDEPENDENT_AMBULATORY_CARE_PROVIDER_SITE_OTHER): Payer: Self-pay | Admitting: Vascular Surgery

## 2021-04-08 DIAGNOSIS — I779 Disorder of arteries and arterioles, unspecified: Secondary | ICD-10-CM

## 2021-04-11 ENCOUNTER — Encounter (INDEPENDENT_AMBULATORY_CARE_PROVIDER_SITE_OTHER): Payer: Self-pay | Admitting: Vascular Surgery

## 2021-04-11 ENCOUNTER — Ambulatory Visit (INDEPENDENT_AMBULATORY_CARE_PROVIDER_SITE_OTHER): Payer: Medicare Other | Admitting: Physician Assistant

## 2021-04-11 ENCOUNTER — Ambulatory Visit (INDEPENDENT_AMBULATORY_CARE_PROVIDER_SITE_OTHER): Payer: Medicare Other

## 2021-04-11 ENCOUNTER — Encounter: Payer: Self-pay | Admitting: Physician Assistant

## 2021-04-11 ENCOUNTER — Ambulatory Visit (INDEPENDENT_AMBULATORY_CARE_PROVIDER_SITE_OTHER): Payer: Medicare Other | Admitting: Vascular Surgery

## 2021-04-11 ENCOUNTER — Other Ambulatory Visit: Payer: Self-pay

## 2021-04-11 ENCOUNTER — Other Ambulatory Visit: Payer: Self-pay | Admitting: Cardiovascular Disease

## 2021-04-11 VITALS — BP 132/72 | HR 44 | Ht 65.0 in | Wt 170.0 lb

## 2021-04-11 DIAGNOSIS — Z0001 Encounter for general adult medical examination with abnormal findings: Secondary | ICD-10-CM

## 2021-04-11 DIAGNOSIS — Z125 Encounter for screening for malignant neoplasm of prostate: Secondary | ICD-10-CM | POA: Diagnosis not present

## 2021-04-11 DIAGNOSIS — I6523 Occlusion and stenosis of bilateral carotid arteries: Secondary | ICD-10-CM | POA: Diagnosis not present

## 2021-04-11 DIAGNOSIS — E039 Hypothyroidism, unspecified: Secondary | ICD-10-CM | POA: Diagnosis not present

## 2021-04-11 DIAGNOSIS — E1165 Type 2 diabetes mellitus with hyperglycemia: Secondary | ICD-10-CM

## 2021-04-11 DIAGNOSIS — E1140 Type 2 diabetes mellitus with diabetic neuropathy, unspecified: Secondary | ICD-10-CM

## 2021-04-11 DIAGNOSIS — Z23 Encounter for immunization: Secondary | ICD-10-CM

## 2021-04-11 DIAGNOSIS — R300 Dysuria: Secondary | ICD-10-CM

## 2021-04-11 DIAGNOSIS — E782 Mixed hyperlipidemia: Secondary | ICD-10-CM

## 2021-04-11 DIAGNOSIS — R001 Bradycardia, unspecified: Secondary | ICD-10-CM

## 2021-04-11 DIAGNOSIS — I779 Disorder of arteries and arterioles, unspecified: Secondary | ICD-10-CM

## 2021-04-11 DIAGNOSIS — I10 Essential (primary) hypertension: Secondary | ICD-10-CM | POA: Diagnosis not present

## 2021-04-11 DIAGNOSIS — R5383 Other fatigue: Secondary | ICD-10-CM

## 2021-04-11 LAB — POCT GLYCOSYLATED HEMOGLOBIN (HGB A1C): Hemoglobin A1C: 7.3 % — AB (ref 4.0–5.6)

## 2021-04-11 MED ORDER — LEVOTHYROXINE SODIUM 75 MCG PO TABS: 75.0000 ug | ORAL_TABLET | Freq: Every day | ORAL | 1 refills | Status: DC

## 2021-04-11 MED ORDER — RAMIPRIL 2.5 MG PO CAPS
2.5000 mg | ORAL_CAPSULE | Freq: Every day | ORAL | 1 refills | Status: DC
Start: 2021-04-11 — End: 2021-10-03

## 2021-04-11 MED ORDER — GLIPIZIDE 5 MG PO TABS: 5.0000 mg | ORAL_TABLET | Freq: Two times a day (BID) | ORAL | 1 refills | Status: DC

## 2021-04-11 NOTE — Progress Notes (Signed)
[facility] [place], [place] [address]  Internal MEDICINE  Office Visit Note  Patient Name: Travis Higgins  [id number]  [id number]  Date of Service: 04/12/2021  Chief Complaint  Patient presents with   Medicare Wellness   Diabetes   Hypertension     HPI Pt is here for routine health maintenance examination with his wife and has no complaints today -Does not check BG at home. Continues 5mg  glipizide BID -he is vegetarian and states he eats well -Sleeps well -Bp not really checked at home either -requests med refills today -has a form with him today for diabetic shoes---peripheral neuropathy with callous along big toes and dry skin, needs [place] to fax form over for supplies. -colonoscopy done already this year -Due for routine labs  Current Medication: Outpatient Encounter Medications as of 04/11/2021  Medication Sig   ezetimibe (ZETIA) 10 MG tablet TAKE ONE TABLET BY MOUTH DAILY   omeprazole (PRILOSEC) 40 MG capsule Take 1 capsule (40 mg total) by mouth in the morning and at bedtime.   [DISCONTINUED] glipiZIDE (GLUCOTROL) 5 MG tablet Take 1 tablet (5 mg total) by mouth 2 (two) times daily.   [DISCONTINUED] levothyroxine (SYNTHROID) 75 MCG tablet Take 1 tablet (75 mcg total) by mouth daily before breakfast.   [DISCONTINUED] ramipril (ALTACE) 2.5 MG capsule Take 1 capsule (2.5 mg total) by mouth daily.   glipiZIDE (GLUCOTROL) 5 MG tablet Take 1 tablet (5 mg total) by mouth 2 (two) times daily.   levothyroxine (SYNTHROID) 75 MCG tablet Take 1 tablet (75 mcg total) by mouth daily before breakfast.   ramipril (ALTACE) 2.5 MG capsule Take 1 capsule (2.5 mg total) by mouth daily.   No facility-administered encounter medications on file as of 04/11/2021.    Surgical History: Past Surgical History:  Procedure Laterality Date   COLONOSCOPY     COLONOSCOPY WITH PROPOFOL N/A 09/20/2015   Procedure: COLONOSCOPY WITH PROPOFOL;  Surgeon: Lucilla Lame, MD;   Location: [place];  Service: Endoscopy;  Laterality: N/A;   COLONOSCOPY WITH PROPOFOL N/A 07/05/2020   Procedure: COLONOSCOPY WITH PROPOFOL;  Surgeon: Lucilla Lame, MD;  Location: [place];  Service: Endoscopy;  Laterality: N/A;  Diabetic - oral meds   POLYPECTOMY  09/20/2015   Procedure: POLYPECTOMY INTESTINAL;  Surgeon: Lucilla Lame, MD;  Location: [place];  Service: Endoscopy;;  Sigmoid colon polyp x 1 Rectal polyp x 2   POLYPECTOMY  07/05/2020   Procedure: POLYPECTOMY;  Surgeon: Lucilla Lame, MD;  Location: [place];  Service: Endoscopy;;    Medical History: Past Medical History:  Diagnosis Date   Anemia    in past   CAD (coronary artery disease)    Cardiac catheterization done in July 2015 in [place] by Dr. Jori Moll fields showed mild nonobstructive disease in the mid LAD and distal left circumflex with normal ejection fraction.   Diabetes mellitus without complication ([place])    Hypertension    Hypothyroidism    Thyroid disease     Family History: Family History  Family history unknown: Yes      Review of Systems  Constitutional:  Negative for chills, fatigue and unexpected weight change.  HENT:  Negative for congestion, postnasal drip, rhinorrhea, sneezing and sore throat.   Eyes:  Negative for redness.  Respiratory:  Negative for cough, chest tightness and shortness of breath.   Cardiovascular:  Negative for chest pain and palpitations.  Gastrointestinal:  Negative for abdominal pain, constipation, diarrhea, nausea and vomiting.  Genitourinary:  Negative for dysuria and frequency.  Musculoskeletal:  Negative for arthralgias, back pain, joint swelling and neck pain.  Skin:  Negative for rash.  Neurological: Negative.  Negative for tremors and numbness.  Hematological:  Negative for adenopathy. Does not bruise/bleed easily.  Psychiatric/Behavioral:  Negative for behavioral problems (Depression), sleep disturbance and suicidal  ideas. The patient is not nervous/anxious.     Vital Signs: BP 135/73   Pulse (!) 46   Temp 98.3 F (36.8 C)   Resp 16   Ht 5\' 5"  (1.651 m)   Wt 171 lb (77.6 kg)   SpO2 97%   BMI 28.46 kg/m    Physical Exam Vitals and nursing note reviewed.  Constitutional:      General: He is not in acute distress.    Appearance: He is well-developed. He is not diaphoretic.  HENT:     Head: Normocephalic and atraumatic.     Mouth/Throat:     Pharynx: No oropharyngeal exudate.  Eyes:     Pupils: Pupils are equal, round, and reactive to light.  Neck:     Thyroid: No thyromegaly.     Vascular: No JVD.     Trachea: No tracheal deviation.  Cardiovascular:     Rate and Rhythm: Normal rate and regular rhythm.     Heart sounds: Normal heart sounds. No murmur heard.   No friction rub. No gallop.  Pulmonary:     Effort: Pulmonary effort is normal. No respiratory distress.     Breath sounds: No wheezing or rales.  Chest:     Chest wall: No tenderness.  Abdominal:     General: Bowel sounds are normal.     Palpations: Abdomen is soft.  Musculoskeletal:        General: Normal range of motion.     Cervical back: Normal range of motion and neck supple.  Feet:     Right foot:     Protective Sensation: 3 sites tested.  2 sites sensed.     Skin integrity: Callus and dry skin present.     Toenail Condition: Right toenails are abnormally thick.     Left foot:     Protective Sensation: 3 sites tested.  2 sites sensed.     Skin integrity: Callus and dry skin present.     Toenail Condition: Left toenails are abnormally thick.  Lymphadenopathy:     Cervical: No cervical adenopathy.  Skin:    General: Skin is warm and dry.  Neurological:     Mental Status: He is alert and oriented to person, place, and time.     Cranial Nerves: No cranial nerve deficit.  Psychiatric:        Behavior: Behavior normal.        Thought Content: Thought content normal.        Judgment: Judgment normal.      LABS: Recent Results (from the past 2160 hour(s))  POCT glycosylated hemoglobin (Hb A1C)     Status: Abnormal   Collection Time: 04/11/21  2:45 PM  Result Value Ref Range   Hemoglobin A1C 7.3 (A) 4.0 - 5.6 %   HbA1c POC (<> result, manual entry)     HbA1c, POC (prediabetic range)     HbA1c, POC (controlled diabetic range)    UA/M w/rflx Culture, Routine     Status: Abnormal   Collection Time: 04/11/21  4:18 PM   Specimen: Urine   Urine  Result Value Ref Range   Specific Gravity, UA 1.028  1.005 - 1.030   pH, UA 5.0 5.0 - 7.5   Color, UA Yellow Yellow   Appearance Ur Clear Clear   Leukocytes,UA Negative Negative   Protein,UA Negative Negative/Trace   Glucose, UA 3+ (A) Negative   Ketones, UA Negative Negative   RBC, UA Negative Negative   Bilirubin, UA Negative Negative   Urobilinogen, Ur 0.2 0.2 - 1.0 mg/dL   Nitrite, UA Negative Negative   Microscopic Examination Comment     Comment: Microscopic follows if indicated.   Microscopic Examination See below:     Comment: Microscopic was indicated and was performed.   Urinalysis Reflex Comment     Comment: This specimen will not reflex to a Urine Culture.  Microscopic Examination     Status: None   Collection Time: 04/11/21  4:18 PM   Urine  Result Value Ref Range   WBC, UA None seen 0 - 5 /hpf   RBC None seen 0 - 2 /hpf   Epithelial Cells (non renal) None seen 0 - 10 /hpf   Casts None seen None seen /lpf   Bacteria, UA None seen None seen/Few        Assessment/Plan: 1. Encounter for general adult medical examination with abnormal findings CPE performed, routine labs ordered, UTD on colon cancer screening  2. Type 2 diabetes mellitus with hyperglycemia, without long-term current use of insulin (HCC) - POCT glycosylated hemoglobin (Hb A1C) is 7.3 which is increased from 7.0 last visit. Patient was advised to start checking sugars at home and to improve diet and exercise. If not improving next visit may need  additional medication though patient is resistant to this currently. Does have some polyneuropathy with callus formation along big toes. Will complete order form for diabetic shoes and fax as requested - ramipril (ALTACE) 2.5 MG capsule; Take 1 capsule (2.5 mg total) by mouth daily.  Dispense: 90 capsule; Refill: 1 - glipiZIDE (GLUCOTROL) 5 MG tablet; Take 1 tablet (5 mg total) by mouth 2 (two) times daily.  Dispense: 180 tablet; Refill: 1  3. Essential hypertension Stable, continue current medication - ramipril (ALTACE) 2.5 MG capsule; Take 1 capsule (2.5 mg total) by mouth daily.  Dispense: 90 capsule; Refill: 1  4. Bradycardia Asymptomatic, will continue to monitor  5. Acquired hypothyroidism Will recheck labs and adjust as needed - levothyroxine (SYNTHROID) 75 MCG tablet; Take 1 tablet (75 mcg total) by mouth daily before breakfast.  Dispense: 90 tablet; Refill: 1 - TSH + free T4  6. Special screening for malignant neoplasm of prostate - PSA Total (Reflex To Free)  7. Other fatigue - CBC w/Diff/Platelet - Comprehensive metabolic panel - Lipid Panel With LDL/HDL Ratio  8. Need for influenza vaccination - Flu Vaccine MDCK QUAD PF  9. Dysuria - UA/M w/rflx Culture, Routine - Microscopic Examination  10. Mixed hyperlipidemia Will recheck labs, continue zetia  General Counseling: Akiva verbalizes understanding of the findings of todays visit and agrees with plan of treatment. I have discussed any further diagnostic evaluation that may be needed or ordered today. We also reviewed his medications today. he has been encouraged to call the office with any questions or concerns that should arise related to todays visit.    Counseling:    Orders Placed This Encounter  Procedures   Microscopic Examination   Flu Vaccine MDCK QUAD PF   CBC w/Diff/Platelet   Comprehensive metabolic panel   Lipid Panel With LDL/HDL Ratio   TSH + free T4   PSA Total (Reflex  To Free)   UA/M  w/rflx Culture, Routine   POCT glycosylated hemoglobin (Hb A1C)    Meds ordered this encounter  Medications   ramipril (ALTACE) 2.5 MG capsule    Sig: Take 1 capsule (2.5 mg total) by mouth daily.    Dispense:  90 capsule    Refill:  1    Please fill as $4 list medication. Thanks.   levothyroxine (SYNTHROID) 75 MCG tablet    Sig: Take 1 tablet (75 mcg total) by mouth daily before breakfast.    Dispense:  90 tablet    Refill:  1    Please fill as 90 day prescription   glipiZIDE (GLUCOTROL) 5 MG tablet    Sig: Take 1 tablet (5 mg total) by mouth 2 (two) times daily.    Dispense:  180 tablet    Refill:  1    Update to BID dosing.    This patient was seen by Drema Dallas, PA-C in collaboration with Dr. Clayborn Bigness as a part of collaborative care agreement.  Total time spent:35 Minutes  Time spent includes review of chart, medications, test results, and follow up plan with the patient.     Lavera Guise, MD  Internal Medicine

## 2021-04-11 NOTE — Progress Notes (Signed)
MRN : [id number]  Travis Higgins is a 70 y.o. (08-Feb-1951) male who presents with chief complaint of check my carotid arteries.  History of Present Illness:   The patient is seen for follow up evaluation of carotid stenosis. The carotid stenosis followed by ultrasound.    The patient denies amaurosis fugax. There is no recent history of TIA symptoms or focal motor deficits. There is no prior documented CVA.   The patient is taking enteric-coated aspirin 81 mg daily.   There is no history of migraine headaches. There is no history of seizures.   The patient does not have a history of coronary artery disease, no recent episodes of angina or shortness of breath. The patient denies PAD or claudication symptoms. There is a history of hyperlipidemia which is being treated with a statin.    Previous CT of the neck Dated February 18, 2020 is reviewed by me.  It is also shown to the patient and his wife.  Although the radiologist read is 70% for the right internal carotid artery by my measurement is closer to 55 to 60%.  Left side is less than 50%.    Carotid Duplex done today shows RICA 99-37% and LICA <16%.  No change compared to last study. Very Stable.  Current Meds  Medication Sig   glipiZIDE (GLUCOTROL) 5 MG tablet Take 1 tablet (5 mg total) by mouth 2 (two) times daily.   levothyroxine (SYNTHROID) 75 MCG tablet Take 1 tablet (75 mcg total) by mouth daily before breakfast.   omeprazole (PRILOSEC) 40 MG capsule Take 1 capsule (40 mg total) by mouth in the morning and at bedtime.   ramipril (ALTACE) 2.5 MG capsule Take 1 capsule (2.5 mg total) by mouth daily.    Past Medical History:  Diagnosis Date   Anemia    in past   CAD (coronary artery disease)    Cardiac catheterization done in July 2015 in [place] by Dr. Jori Moll fields showed mild nonobstructive disease in the mid LAD and distal left circumflex with normal ejection fraction.   Diabetes mellitus without complication  ([place])    Hypertension    Hypothyroidism    Thyroid disease     Past Surgical History:  Procedure Laterality Date   COLONOSCOPY     COLONOSCOPY WITH PROPOFOL N/A 09/20/2015   Procedure: COLONOSCOPY WITH PROPOFOL;  Surgeon: Lucilla Lame, MD;  Location: [place];  Service: Endoscopy;  Laterality: N/A;   COLONOSCOPY WITH PROPOFOL N/A 07/05/2020   Procedure: COLONOSCOPY WITH PROPOFOL;  Surgeon: Lucilla Lame, MD;  Location: [place];  Service: Endoscopy;  Laterality: N/A;  Diabetic - oral meds   POLYPECTOMY  09/20/2015   Procedure: POLYPECTOMY INTESTINAL;  Surgeon: Lucilla Lame, MD;  Location: [place];  Service: Endoscopy;;  Sigmoid colon polyp x 1 Rectal polyp x 2   POLYPECTOMY  07/05/2020   Procedure: POLYPECTOMY;  Surgeon: Lucilla Lame, MD;  Location: [place];  Service: Endoscopy;;    Social History Social History   Tobacco Use   Smoking status: Former   Smokeless tobacco: Never   Tobacco comments:    quit approx 2013  Vaping Use   Vaping Use: Never used  Substance Use Topics   Alcohol use: No   Drug use: No    Family History Family History  Family history unknown: Yes    Allergies  Allergen Reactions   Levothyroxine      REVIEW OF SYSTEMS (Negative unless checked)  Constitutional: [] Weight loss  []   Fever  [] Chills Cardiac: [] Chest pain   [] Chest pressure   [] Palpitations   [] Shortness of breath when laying flat   [] Shortness of breath with exertion. Vascular:  [] Pain in legs with walking   [] Pain in legs at rest  [] History of DVT   [] Phlebitis   [] Swelling in legs   [] Varicose veins   [] Non-healing ulcers Pulmonary:   [] Uses home oxygen   [] Productive cough   [] Hemoptysis   [] Wheeze  [] COPD   [] Asthma Neurologic:  [] Dizziness   [] Seizures   [] History of stroke   [] History of TIA  [] Aphasia   [] Vissual changes   [] Weakness or numbness in arm   [] Weakness or numbness in leg Musculoskeletal:   [] Joint swelling   [] Joint pain    [] Low back pain Hematologic:  [] Easy bruising  [] Easy bleeding   [] Hypercoagulable state   [] Anemic Gastrointestinal:  [] Diarrhea   [] Vomiting  [] Gastroesophageal reflux/heartburn   [] Difficulty swallowing. Genitourinary:  [] Chronic kidney disease   [] Difficult urination  [] Frequent urination   [] Blood in urine Skin:  [] Rashes   [] Ulcers  Psychological:  [] History of anxiety   []  History of major depression.  Physical Examination  Vitals:   04/11/21 1009  BP: 132/72  Pulse: (!) 44  Weight: 170 lb (77.1 kg)  Height: 5\' 5"  (1.651 m)   Body mass index is 28.29 kg/m. Gen: WD/WN, NAD Head: [place]/AT, No temporalis wasting.  Ear/Nose/Throat: Hearing grossly intact, nares w/o erythema or drainage Eyes: PER, EOMI, sclera nonicteric.  Neck: Supple, no masses.  No bruit or JVD.  Pulmonary:  Good air movement, no audible wheezing, no use of accessory muscles.  Cardiac: RRR, normal S1, S2, no Murmurs. Vascular:   no carotid bruit Vessel Right Left  Radial Palpable Palpable  Carotid Palpable Palpable  Gastrointestinal: soft, non-distended. No guarding/no peritoneal signs.  Musculoskeletal: M/S 5/5 throughout.  No visible deformity.  Neurologic: CN 2-12 intact. Pain and light touch intact in extremities.  Symmetrical.  Speech is fluent. Motor exam as listed above. Psychiatric: Judgment intact, Mood & affect appropriate for pt's clinical situation. Dermatologic: No rashes or ulcers noted.  No changes consistent with cellulitis.   CBC Lab Results  Component Value Date   WBC 4.6 02/14/2020   HGB 13.2 02/14/2020   HCT 41.5 02/14/2020   MCV 84.2 02/14/2020   PLT 205 02/14/2020    BMET    Component Value Date/Time   NA 136 02/14/2020 0739   K 4.3 02/14/2020 0739   CL 103 02/14/2020 0739   CO2 27 02/14/2020 0739   GLUCOSE 67 (L) 02/14/2020 0739   BUN 14 02/14/2020 0739   CREATININE 0.90 02/14/2020 0739   CALCIUM 9.1 02/14/2020 0739   GFRNONAA >60 02/14/2020 0739   GFRAA >60  02/14/2020 0739   CrCl cannot be calculated (Patient's most recent lab result is older than the maximum 21 days allowed.).  COAG No results found for: INR, PROTIME  Radiology No results found.   Assessment/Plan 1. Bilateral carotid artery stenosis Recommend:   Given the patient's asymptomatic subcritical stenosis no further invasive testing or surgery at this time.   Carotid Duplex done today shows RICA 01-09% and LICA <32%.  No change compared to last study.   Continue antiplatelet therapy as prescribed Continue management of CAD, HTN and Hyperlipidemia Healthy heart diet,  encouraged exercise at least 4 times per week Follow up in 12 months with duplex ultrasound and physical exam.  - VAS US CAROTID; Future  2. Essential hypertension Continue antihypertensive  medications as already ordered, these medications have been reviewed and there are no changes at this time.   3. Type 2 diabetes mellitus with diabetic neuropathy, unspecified whether long term insulin use ([place]) Continue hypoglycemic medications as already ordered, these medications have been reviewed and there are no changes at this time.  Hgb A1C to be monitored as already arranged by primary service   4. Mixed hyperlipidemia Continue statin as ordered and reviewed, no changes at this time     Hortencia Pilar, MD  04/11/2021 10:18 AM

## 2021-04-12 LAB — UA/M W/RFLX CULTURE, ROUTINE
Bilirubin, UA: NEGATIVE
Ketones, UA: NEGATIVE
Leukocytes,UA: NEGATIVE
Nitrite, UA: NEGATIVE
Protein,UA: NEGATIVE
RBC, UA: NEGATIVE
Specific Gravity, UA: 1.028 (ref 1.005–1.030)
Urobilinogen, Ur: 0.2 mg/dL (ref 0.2–1.0)
pH, UA: 5 (ref 5.0–7.5)

## 2021-04-12 LAB — MICROSCOPIC EXAMINATION
Bacteria, UA: NONE SEEN
Casts: NONE SEEN /lpf
Epithelial Cells (non renal): NONE SEEN /hpf (ref 0–10)
RBC, Urine: NONE SEEN /hpf (ref 0–2)
WBC, UA: NONE SEEN /hpf (ref 0–5)

## 2021-04-18 ENCOUNTER — Telehealth: Payer: Self-pay

## 2021-04-18 NOTE — Telephone Encounter (Signed)
Faxed diabetic shoe to [facility] and desha called pt that paper work ready for pickup

## 2021-04-19 ENCOUNTER — Other Ambulatory Visit: Payer: Self-pay

## 2021-04-19 ENCOUNTER — Ambulatory Visit (INDEPENDENT_AMBULATORY_CARE_PROVIDER_SITE_OTHER): Payer: Medicare Other | Admitting: Cardiovascular Disease

## 2021-04-19 ENCOUNTER — Encounter: Payer: Self-pay | Admitting: Cardiovascular Disease

## 2021-04-19 VITALS — BP 140/70 | HR 52 | Ht 65.0 in | Wt 170.5 lb

## 2021-04-19 DIAGNOSIS — I10 Essential (primary) hypertension: Secondary | ICD-10-CM | POA: Diagnosis not present

## 2021-04-19 DIAGNOSIS — I2510 Atherosclerotic heart disease of native coronary artery without angina pectoris: Secondary | ICD-10-CM

## 2021-04-19 DIAGNOSIS — I779 Disorder of arteries and arterioles, unspecified: Secondary | ICD-10-CM | POA: Diagnosis not present

## 2021-04-19 DIAGNOSIS — E785 Hyperlipidemia, unspecified: Secondary | ICD-10-CM

## 2021-04-19 DIAGNOSIS — I6523 Occlusion and stenosis of bilateral carotid arteries: Secondary | ICD-10-CM

## 2021-04-19 NOTE — Patient Instructions (Signed)
Medication Instructions:  Your physician recommends that you continue on your current medications as directed. Please refer to the Current Medication list given to you today.  *If you need a refill on your cardiac medications before your next appointment, please call your pharmacy*   Lab Work: None ordered If you have labs (blood work) drawn today and your tests are completely normal, you will receive your results only by: [place] (if you have MyChart) OR A paper copy in the mail If you have any lab test that is abnormal or we need to change your treatment, we will call you to review the results.   Testing/Procedures: None ordered   Follow-Up: At [facility], you and your health needs are our priority.  As part of our continuing mission to provide you with exceptional heart care, we have created designated Provider Care Teams.  These Care Teams include your primary Cardiologist (physician) and Advanced Practice Providers (APPs -  Physician Assistants and Nurse Practitioners) who all work together to provide you with the care you need, when you need it.  We recommend signing up for the patient portal called "MyChart".  Sign up information is provided on this After Visit Summary.  MyChart is used to connect with patients for Virtual Visits (Telemedicine).  Patients are able to view lab/test results, encounter notes, upcoming appointments, etc.  Non-urgent messages can be sent to your provider as well.   To learn more about what you can do with MyChart, go to [email or web address].    Your next appointment:   Your physician wants you to follow-up in: 1 year You will receive a reminder letter in the mail two months in advance. If you don't receive a letter, please call our office to schedule the follow-up appointment.   The format for your next appointment:   In Person  Provider:   You may see Kathlyn Sacramento, MD or one of the following Advanced Practice Providers on your  designated Care Team:   Murray Hodgkins, NP Christell Faith, PA-C Cadence Kathlen Mody, [place]    Other Instructions   Heart-Healthy Eating Plan Heart-healthy meal planning includes: Eating less unhealthy fats. Eating more healthy fats. Making other changes in your diet. Talk with your doctor or a diet specialist (dietitian) to create an eating plan that is right for you. What are tips for following this plan?  Cooking Avoid frying your food. Try to bake, boil, grill, or broil it instead. You can also reduce fat by: Removing the skin from poultry. Removing all visible fats from meats. Steaming vegetables in water or broth. Meal planning  At meals, divide your plate into four equal parts: Fill one-half of your plate with vegetables and green salads. Fill one-fourth of your plate with whole grains. Fill one-fourth of your plate with lean protein foods. Eat 4-5 servings of vegetables per day. A serving of vegetables is: 1 cup of raw or cooked vegetables. 2 cups of raw leafy greens. Eat 4-5 servings of fruit per day. A serving of fruit is: 1 medium whole fruit.  cup of dried fruit.  cup of fresh, frozen, or canned fruit.  cup of 100% fruit juice. Eat more foods that have soluble fiber. These are apples, broccoli, carrots, beans, peas, and barley. Try to get 20-30 g of fiber per day. Eat 4-5 servings of nuts, legumes, and seeds per week: 1 serving of dried beans or legumes equals  cup after being cooked. 1 serving of nuts is  cup. 1 serving of seeds  equals 1 tablespoon. General information Eat more home-cooked food. Eat less restaurant, buffet, and fast food. Limit or avoid alcohol. Limit foods that are high in starch and sugar. Avoid fried foods. Lose weight if you are overweight. Keep track of how much salt (sodium) you eat. This is important if you have high blood pressure. Ask your doctor to tell you more about this. Try to add vegetarian meals each week. Fats Choose healthy  fats. These include olive oil and canola oil, flaxseeds, walnuts, almonds, and seeds. Eat more omega-3 fats. These include salmon, mackerel, sardines, tuna, flaxseed oil, and ground flaxseeds. Try to eat fish at least 2 times each week. Check food labels. Avoid foods with trans fats or high amounts of saturated fat. Limit saturated fats. These are often found in animal products, such as meats, butter, and cream. These are also found in plant foods, such as palm oil, palm kernel oil, and coconut oil. Avoid foods with partially hydrogenated oils in them. These have trans fats. Examples are stick margarine, some tub margarines, cookies, crackers, and other baked goods. What foods can I eat? Fruits All fresh, canned (in natural juice), or frozen fruits. Vegetables Fresh or frozen vegetables (raw, steamed, roasted, or grilled). Green salads. Grains Most grains. Choose whole wheat and whole grains most of the time. Rice and pasta, including brown rice and pastas made with whole wheat. Meats and other proteins Lean, well-trimmed beef, veal, pork, and lamb. Chicken and [place] without skin. All fish and shellfish. Wild duck, rabbit, pheasant, and venison. Egg whites or low-cholesterol egg substitutes. Dried beans, peas, lentils, and tofu. Seeds and most nuts. Dairy Low-fat or nonfat cheeses, including ricotta and mozzarella. Skim or 1% milk that is liquid, powdered, or evaporated. Buttermilk that is made with low-fat milk. Nonfat or low-fat yogurt. Fats and oils Non-hydrogenated (trans-free) margarines. Vegetable oils, including soybean, sesame, sunflower, olive, peanut, safflower, corn, canola, and cottonseed. Salad dressings or mayonnaise made with a vegetable oil. Beverages Mineral water. Coffee and tea. Diet carbonated beverages. Sweets and desserts Sherbet, gelatin, and fruit ice. Small amounts of dark chocolate. Limit all sweets and desserts. Seasonings and condiments All seasonings and  condiments. The items listed above may not be a complete list of foods and drinks you can eat. Contact a dietitian for more options. What foods should I avoid? Fruits Canned fruit in heavy syrup. Fruit in cream or butter sauce. Fried fruit. Limit coconut. Vegetables Vegetables cooked in cheese, cream, or butter sauce. Fried vegetables. Grains Breads that are made with saturated or trans fats, oils, or whole milk. Croissants. Sweet rolls. Donuts. High-fat crackers, such as cheese crackers. Meats and other proteins Fatty meats, such as hot dogs, ribs, sausage, bacon, rib-eye roast or steak. High-fat deli meats, such as salami and bologna. Caviar. Domestic duck and goose. Organ meats, such as liver. Dairy Cream, sour cream, cream cheese, and creamed cottage cheese. Whole-milk cheeses. Whole or 2% milk that is liquid, evaporated, or condensed. Whole buttermilk. Cream sauce or high-fat cheese sauce. Yogurt that is made from whole milk. Fats and oils Meat fat, or shortening. Cocoa butter, hydrogenated oils, palm oil, coconut oil, palm kernel oil. Solid fats and shortenings, including bacon fat, salt pork, lard, and butter. Nondairy cream substitutes. Salad dressings with cheese or sour cream. Beverages Regular sodas and juice drinks with added sugar. Sweets and desserts Frosting. Pudding. Cookies. Cakes. Pies. Milk chocolate or white chocolate. Buttered syrups. Full-fat ice cream or ice cream drinks. The items listed above may  not be a complete list of foods and drinks to avoid. Contact a dietitian for more information. Summary Heart-healthy meal planning includes eating less unhealthy fats, eating more healthy fats, and making other changes in your diet. Eat a balanced diet. This includes fruits and vegetables, low-fat or nonfat dairy, lean protein, nuts and legumes, whole grains, and heart-healthy oils and fats. This information is not intended to replace advice given to you by your health care  provider. Make sure you discuss any questions you have with your health care provider. Document Revised: 10/07/2020 Document Reviewed: 10/07/2020 Elsevier Patient Education  2022 [employer].

## 2021-04-19 NOTE — Progress Notes (Signed)
Cardiology Office Note   Date:  04/19/2021   ID:  Travis, Higgins [birth year], MRN [id number]  PCP:  Lavera Guise, MD  Cardiologist:   Kathlyn Sacramento, MD   Chief Complaint  Patient presents with   Other    12 month f/u no complaints today. Meds reviewed verbally with pt.       History of Present Illness: Travis Higgins is a 70 y.o. male who is here today for follow-up visit regarding abnormal stress test and presumed coronary artery disease. He had previous cardiac catheterization in 2015 in [place] which showed mild nonobstructive disease affecting the LAD and left circumflex.  No revascularization was required.  He has prolonged history of type 2 diabetes, essential hypertension, hyperlipidemia and hypothyroidism.  He is a previous smoker with no family history of coronary artery disease.   He is a retired Radio producer.   He was evaluated in 2018 for exertional dyspnea. He underwent a treadmill nuclear stress test which was intermediate risk study with evidence of prior anterior/anteroseptal infarct as well as apical inferior defect.  The distribution was consistent with a distal LAD as well as OM branch territory.  EF was 49%. Echocardiogram showed an EF of 60 to 65% with no significant valvular abnormalities.  Cardiac catheterization was recommended but was declined by the patient given that his previous cath in 2015 did not show obstructive disease. He also has moderate carotid disease followed by vascular surgery.  He had myalgia with statins was prescribed Zetia last year with no issues. He has been doing well with no chest pain or shortness of breath.   Past Medical History:  Diagnosis Date   Anemia    in past   CAD (coronary artery disease)    Cardiac catheterization done in July 2015 in [place] by Dr. Jori Moll fields showed mild nonobstructive disease in the mid LAD and distal left circumflex with normal ejection fraction.   Diabetes mellitus without  complication ([place])    Hypertension    Hypothyroidism    Thyroid disease     Past Surgical History:  Procedure Laterality Date   COLONOSCOPY     COLONOSCOPY WITH PROPOFOL N/A 09/20/2015   Procedure: COLONOSCOPY WITH PROPOFOL;  Surgeon: Lucilla Lame, MD;  Location: [place];  Service: Endoscopy;  Laterality: N/A;   COLONOSCOPY WITH PROPOFOL N/A 07/05/2020   Procedure: COLONOSCOPY WITH PROPOFOL;  Surgeon: Lucilla Lame, MD;  Location: [place];  Service: Endoscopy;  Laterality: N/A;  Diabetic - oral meds   POLYPECTOMY  09/20/2015   Procedure: POLYPECTOMY INTESTINAL;  Surgeon: Lucilla Lame, MD;  Location: [place];  Service: Endoscopy;;  Sigmoid colon polyp x 1 Rectal polyp x 2   POLYPECTOMY  07/05/2020   Procedure: POLYPECTOMY;  Surgeon: Lucilla Lame, MD;  Location: [place];  Service: Endoscopy;;     Current Outpatient Medications  Medication Sig Dispense Refill   aspirin EC 81 MG tablet Take 81 mg by mouth daily. Swallow whole.     ezetimibe (ZETIA) 10 MG tablet TAKE ONE TABLET BY MOUTH DAILY 90 tablet 3   glipiZIDE (GLUCOTROL) 5 MG tablet Take 1 tablet (5 mg total) by mouth 2 (two) times daily. 180 tablet 1   levothyroxine (SYNTHROID) 75 MCG tablet Take 1 tablet (75 mcg total) by mouth daily before breakfast. 90 tablet 1   omeprazole (PRILOSEC) 40 MG capsule Take 1 capsule (40 mg total) by mouth in the morning and at bedtime. 180 capsule 1  ramipril (ALTACE) 2.5 MG capsule Take 1 capsule (2.5 mg total) by mouth daily. 90 capsule 1   No current facility-administered medications for this visit.    Allergies:   Levothyroxine    Social History:  The patient  reports that he has quit smoking. He has never used smokeless tobacco. He reports that he does not drink alcohol and does not use drugs.   Family History:  The patient's family history is negative for coronary artery disease.   ROS:  Please see the history of present illness.   Otherwise,  review of systems are positive for none.   All other systems are reviewed and negative.    PHYSICAL EXAM: VS:  BP 140/70 (BP Location: Left Arm, Patient Position: Sitting, Cuff Size: Normal)   Pulse (!) 52   Ht 5\' 5"  (1.651 m)   Wt 170 lb 8 oz (77.3 kg)   SpO2 97%   BMI 28.37 kg/m  , BMI Body mass index is 28.37 kg/m. GEN: Well nourished, well developed, in no acute distress  HEENT: normal  Neck: no JVD, carotid bruits, or masses Cardiac: RRR; no murmurs, rubs, or gallops,no edema  Respiratory:  clear to auscultation bilaterally, normal work of breathing GI: soft, nontender, nondistended, + BS MS: no deformity or atrophy  Skin: warm and dry, no rash Neuro:  Strength and sensation are intact Psych: euthymic mood, full affect   EKG:  EKG is ordered today. The ekg ordered today demonstrates sinus bradycardia with first-degree AV block, right bundle branch block and possible old inferior infarct and possible old anterior infarct.  PACs.   Recent Labs: 06/19/2020: ALT 19    Lipid Panel    Component Value Date/Time   CHOL 157 06/19/2020 0837   TRIG 116 06/19/2020 0837   HDL 36 (L) 06/19/2020 0837   CHOLHDL 4.4 06/19/2020 0837   VLDL 23 06/19/2020 0837   LDLCALC 98 06/19/2020 0837      Wt Readings from Last 3 Encounters:  04/19/21 170 lb 8 oz (77.3 kg)  04/11/21 171 lb (77.6 kg)  04/11/21 170 lb (77.1 kg)       PAD Screen 05/24/2017  Previous PAD dx? No  Previous surgical procedure? No  Pain with walking? No  Feet/toe relief with dangling? No  Painful, non-healing ulcers? No  Extremities discolored? No      ASSESSMENT AND PLAN:  1.  Coronary artery disease involving native coronary arteries without angina: The patient is doing well overall with no anginal symptoms.  Continue medical therapy.  Continue aspirin 81 mg once daily.  2.  Hyperlipidemia: The patient had myalgia with rosuvastatin and did not want to try another statin.  He is tolerating Zetia without  issues.  I reviewed most recent lipid profile done in January which showed improvement in LDL from 116 to 98.  Continue same medication for now. We provided him with heart healthy diet instructions.  3.  Essential hypertension: Blood pressure is controlled on current medications.  4.  Carotid disease: Moderate and followed by vascular surgery.   Disposition:   FU with me in 12 months.   Signed,  Kathlyn Sacramento, MD  04/19/2021 5:25 PM    [facility]

## 2021-06-03 DIAGNOSIS — Z23 Encounter for immunization: Secondary | ICD-10-CM | POA: Diagnosis not present

## 2021-06-25 ENCOUNTER — Other Ambulatory Visit
Admission: RE | Admit: 2021-06-25 | Discharge: 2021-06-25 | Disposition: A | Discharge status: Home / Self Care | Payer: Medicare Other | Source: Ambulatory Visit | Attending: Internal Medicine | Admitting: Internal Medicine

## 2021-06-25 DIAGNOSIS — I10 Essential (primary) hypertension: Secondary | ICD-10-CM | POA: Insufficient documentation

## 2021-06-25 DIAGNOSIS — Z125 Encounter for screening for malignant neoplasm of prostate: Secondary | ICD-10-CM | POA: Diagnosis not present

## 2021-06-25 DIAGNOSIS — E039 Hypothyroidism, unspecified: Secondary | ICD-10-CM | POA: Diagnosis not present

## 2021-06-25 DIAGNOSIS — E782 Mixed hyperlipidemia: Secondary | ICD-10-CM | POA: Insufficient documentation

## 2021-06-25 DIAGNOSIS — E119 Type 2 diabetes mellitus without complications: Secondary | ICD-10-CM | POA: Diagnosis not present

## 2021-06-25 DIAGNOSIS — R5383 Other fatigue: Secondary | ICD-10-CM | POA: Diagnosis not present

## 2021-06-25 DIAGNOSIS — E611 Iron deficiency: Secondary | ICD-10-CM | POA: Insufficient documentation

## 2021-06-25 LAB — CBC WITH DIFFERENTIAL/PLATELET
Abs Immature Granulocytes: 0.03 10*3/uL (ref 0.00–0.07)
Basophils Absolute: 0.1 10*3/uL (ref 0.0–0.1)
Basophils Relative: 2 %
Eosinophils Absolute: 0.2 10*3/uL (ref 0.0–0.5)
Eosinophils Relative: 3 %
HCT: 44.5 % (ref 39.0–52.0)
Hemoglobin: 14.5 g/dL (ref 13.0–17.0)
Immature Granulocytes: 1 %
Lymphocytes Relative: 25 %
Lymphs Abs: 1.4 10*3/uL (ref 0.7–4.0)
MCH: 27.2 pg (ref 26.0–34.0)
MCHC: 32.6 g/dL (ref 30.0–36.0)
MCV: 83.3 fL (ref 80.0–100.0)
Monocytes Absolute: 0.5 10*3/uL (ref 0.1–1.0)
Monocytes Relative: 10 %
Neutro Abs: 3.4 10*3/uL (ref 1.7–7.7)
Neutrophils Relative %: 59 %
Platelets: 214 10*3/uL (ref 150–400)
RBC: 5.34 MIL/uL (ref 4.22–5.81)
RDW: 13.2 % (ref 11.5–15.5)
WBC: 5.6 10*3/uL (ref 4.0–10.5)
nRBC: 0 % (ref 0.0–0.2)

## 2021-06-25 LAB — LIPID PANEL
Cholesterol: 164 mg/dL (ref 0–200)
HDL: 31 mg/dL — ABNORMAL LOW (ref >40)
LDL Cholesterol: 109 mg/dL — ABNORMAL HIGH (ref 0–99)
Total CHOL/HDL Ratio: 5.3 RATIO
Triglycerides: 118 mg/dL (ref <150)
VLDL: 24 mg/dL (ref 0–40)

## 2021-06-25 LAB — COMPREHENSIVE METABOLIC PANEL
ALT: 23 U/L (ref 0–44)
AST: 23 U/L (ref 15–41)
Albumin: 4 g/dL (ref 3.5–5.0)
Alkaline Phosphatase: 80 U/L (ref 38–126)
Anion gap: 5 (ref 5–15)
BUN: 10 mg/dL (ref 8–23)
CO2: 26 mmol/L (ref 22–32)
Calcium: 9 mg/dL (ref 8.9–10.3)
Chloride: 102 mmol/L (ref 98–111)
Creatinine, Ser: 1 mg/dL (ref 0.61–1.24)
GFR, Estimated: >60 mL/min (ref >60)
Glucose, Bld: 85 mg/dL (ref 70–99)
Potassium: 4.2 mmol/L (ref 3.5–5.1)
Sodium: 133 mmol/L — ABNORMAL LOW (ref 135–145)
Total Bilirubin: 1 mg/dL (ref 0.3–1.2)
Total Protein: 7.1 g/dL (ref 6.5–8.1)

## 2021-06-25 LAB — T4, FREE: Free T4: 1.15 ng/dL — ABNORMAL HIGH (ref 0.61–1.12)

## 2021-06-25 LAB — TSH: TSH: 1.981 u[IU]/mL (ref 0.350–4.500)

## 2021-06-26 LAB — PSA, TOTAL AND FREE
PSA, Free Pct: 42.5 %
PSA, Free: 0.17 ng/mL
Prostate Specific Ag, Serum: 0.4 ng/mL (ref 0.0–4.0)

## 2021-07-07 ENCOUNTER — Other Ambulatory Visit: Payer: Self-pay

## 2021-07-07 ENCOUNTER — Ambulatory Visit (INDEPENDENT_AMBULATORY_CARE_PROVIDER_SITE_OTHER): Payer: Medicare Other | Admitting: Physician Assistant

## 2021-07-07 ENCOUNTER — Encounter: Payer: Self-pay | Admitting: Physician Assistant

## 2021-07-07 VITALS — BP 127/71 | HR 57 | Temp 98.6°F | Resp 16 | Ht 65.0 in | Wt 170.0 lb

## 2021-07-07 DIAGNOSIS — E782 Mixed hyperlipidemia: Secondary | ICD-10-CM

## 2021-07-07 DIAGNOSIS — I10 Essential (primary) hypertension: Secondary | ICD-10-CM

## 2021-07-07 DIAGNOSIS — K219 Gastro-esophageal reflux disease without esophagitis: Secondary | ICD-10-CM | POA: Diagnosis not present

## 2021-07-07 DIAGNOSIS — E1165 Type 2 diabetes mellitus with hyperglycemia: Secondary | ICD-10-CM

## 2021-07-07 DIAGNOSIS — E039 Hypothyroidism, unspecified: Secondary | ICD-10-CM

## 2021-07-07 DIAGNOSIS — E538 Deficiency of other specified B group vitamins: Secondary | ICD-10-CM

## 2021-07-07 LAB — POCT GLYCOSYLATED HEMOGLOBIN (HGB A1C): Hemoglobin A1C: 7.3 % — AB (ref 4.0–5.6)

## 2021-07-07 MED ORDER — CYANOCOBALAMIN 1000 MCG/ML IJ SOLN
1000.0000 ug | Freq: Once | INTRAMUSCULAR | Status: AC
  Administered 2021-07-07: 1000 ug via INTRAMUSCULAR

## 2021-07-07 MED ORDER — OMEPRAZOLE 40 MG PO CPDR: 40.0000 mg | DELAYED_RELEASE_CAPSULE | Freq: Two times a day (BID) | ORAL | 1 refills | Status: DC

## 2021-07-07 MED ORDER — LEVOTHYROXINE SODIUM 50 MCG PO TABS: 50.0000 ug | ORAL_TABLET | Freq: Every day | ORAL | 3 refills | Status: DC

## 2021-07-07 NOTE — Progress Notes (Signed)
[facility] [place], [place] [address]  Internal MEDICINE  Office Visit Note  Patient Name: Travis Higgins  [id number]  [id number]  Date of Service: 07/13/2021  Chief Complaint  Patient presents with   Follow-up   Hypertension   Diabetes   Quality Metric Gaps    Foot Exam   Medication Refill    Might need more omeprazole refills - has about 1 week's worth left of medication     HPI Pt is here for routine follow up -BG usually not checked, occasionally checked in Am 70s -Exercises much more in warmer months and is looking forward to getting back into this -reviewed labs which showed elevated free t4 and will therefore lower synthroid and recheck labs in 6-8weeks -his cholesterol is also slightly elevated and continues to take zetia as he can not tolerate statins; additionally his sodium was a little low and will work to stay hydrated with water and electrolytes -Will plan to check b12 with repeat thyroid labs  Current Medication: Outpatient Encounter Medications as of 07/07/2021  Medication Sig   aspirin EC 81 MG tablet Take 81 mg by mouth daily. Swallow whole.   ezetimibe (ZETIA) 10 MG tablet TAKE ONE TABLET BY MOUTH DAILY   glipiZIDE (GLUCOTROL) 5 MG tablet Take 1 tablet (5 mg total) by mouth 2 (two) times daily.   levothyroxine (SYNTHROID) 50 MCG tablet Take 1 tablet (50 mcg total) by mouth daily.   ramipril (ALTACE) 2.5 MG capsule Take 1 capsule (2.5 mg total) by mouth daily.   [DISCONTINUED] levothyroxine (SYNTHROID) 75 MCG tablet Take 1 tablet (75 mcg total) by mouth daily before breakfast.   [DISCONTINUED] omeprazole (PRILOSEC) 40 MG capsule Take 1 capsule (40 mg total) by mouth in the morning and at bedtime.   omeprazole (PRILOSEC) 40 MG capsule Take 1 capsule (40 mg total) by mouth in the morning and at bedtime.   [EXPIRED] cyanocobalamin ((VITAMIN B-12)) injection 1,000 mcg    No facility-administered encounter medications on file as of  07/07/2021.    Surgical History: Past Surgical History:  Procedure Laterality Date   COLONOSCOPY     COLONOSCOPY WITH PROPOFOL N/A 09/20/2015   Procedure: COLONOSCOPY WITH PROPOFOL;  Surgeon: Lucilla Lame, MD;  Location: [place];  Service: Endoscopy;  Laterality: N/A;   COLONOSCOPY WITH PROPOFOL N/A 07/05/2020   Procedure: COLONOSCOPY WITH PROPOFOL;  Surgeon: Lucilla Lame, MD;  Location: [place];  Service: Endoscopy;  Laterality: N/A;  Diabetic - oral meds   POLYPECTOMY  09/20/2015   Procedure: POLYPECTOMY INTESTINAL;  Surgeon: Lucilla Lame, MD;  Location: [place];  Service: Endoscopy;;  Sigmoid colon polyp x 1 Rectal polyp x 2   POLYPECTOMY  07/05/2020   Procedure: POLYPECTOMY;  Surgeon: Lucilla Lame, MD;  Location: [place];  Service: Endoscopy;;    Medical History: Past Medical History:  Diagnosis Date   Anemia    in past   CAD (coronary artery disease)    Cardiac catheterization done in July 2015 in [place] by Dr. Jori Moll fields showed mild nonobstructive disease in the mid LAD and distal left circumflex with normal ejection fraction.   Diabetes mellitus without complication ([place])    Hypertension    Hypothyroidism    Thyroid disease     Family History: Family History  Family history unknown: Yes    Social History   Socioeconomic History   Marital status: Married    Spouse name: Not on file   Number of children:  Not on file   Years of education: Not on file   Highest education level: Not on file  Occupational History   Not on file  Tobacco Use   Smoking status: Former   Smokeless tobacco: Never   Tobacco comments:    quit approx 2013  Vaping Use   Vaping Use: Never used  Substance and Sexual Activity   Alcohol use: No   Drug use: No   Sexual activity: Not on file  Other Topics Concern   Not on file  Social History Narrative   Not on file   Social Determinants of Health   Financial Resource Strain: Not on  file  Food Insecurity: Not on file  Transportation Needs: Not on file  Physical Activity: Not on file  Stress: Not on file  Social Connections: Not on file  Intimate Partner Violence: Not on file      Review of Systems  Constitutional:  Negative for chills, fatigue and unexpected weight change.  HENT:  Negative for congestion, postnasal drip, rhinorrhea, sneezing and sore throat.   Eyes:  Negative for redness.  Respiratory:  Negative for cough, chest tightness and shortness of breath.   Cardiovascular:  Negative for chest pain and palpitations.  Gastrointestinal:  Negative for abdominal pain, constipation, diarrhea, nausea and vomiting.  Genitourinary:  Negative for dysuria and frequency.  Musculoskeletal:  Negative for arthralgias, back pain, joint swelling and neck pain.  Skin:  Negative for rash.  Neurological: Negative.  Negative for tremors and numbness.  Hematological:  Negative for adenopathy. Does not bruise/bleed easily.  Psychiatric/Behavioral:  Negative for behavioral problems (Depression), sleep disturbance and suicidal ideas. The patient is not nervous/anxious.    Vital Signs: BP 127/71    Pulse (!) 57    Temp 98.6 F (37 C)    Resp 16    Ht 5\' 5"  (1.651 m)    Wt 170 lb (77.1 kg)    SpO2 96%    BMI 28.29 kg/m    Physical Exam Vitals and nursing note reviewed.  Constitutional:      General: He is not in acute distress.    Appearance: He is well-developed. He is not diaphoretic.  HENT:     Head: Normocephalic and atraumatic.     Mouth/Throat:     Pharynx: No oropharyngeal exudate.  Eyes:     Pupils: Pupils are equal, round, and reactive to light.  Neck:     Thyroid: No thyromegaly.     Vascular: No JVD.     Trachea: No tracheal deviation.  Cardiovascular:     Rate and Rhythm: Normal rate and regular rhythm.     Heart sounds: Normal heart sounds. No murmur heard.   No friction rub. No gallop.  Pulmonary:     Effort: Pulmonary effort is normal. No  respiratory distress.     Breath sounds: No wheezing or rales.  Chest:     Chest wall: No tenderness.  Abdominal:     General: Bowel sounds are normal.     Palpations: Abdomen is soft.  Musculoskeletal:        General: Normal range of motion.     Cervical back: Normal range of motion and neck supple.  Lymphadenopathy:     Cervical: No cervical adenopathy.  Skin:    General: Skin is warm and dry.  Neurological:     Mental Status: He is alert and oriented to person, place, and time.     Cranial Nerves: No cranial nerve deficit.  Psychiatric:        Behavior: Behavior normal.        Thought Content: Thought content normal.        Judgment: Judgment normal.       Assessment/Plan: 1. Type 2 diabetes mellitus with hyperglycemia, without long-term current use of insulin (HCC) - POCT HgB A1C 7.3 which is stable from last visit.  Again discussed desire to adjust medications in order to achieve better control however patient declines at this time and states he will do better with exercise now that the weather will get be getting warmer and will work on diet as well.  Again discussed if not improving at next visit will need adjustment to medications  2. Essential hypertension Stable, continue current medication  3. Acquired hypothyroidism Will reduce Synthroid dose and recheck labs in 6 to 8 weeks - levothyroxine (SYNTHROID) 50 MCG tablet; Take 1 tablet (50 mcg total) by mouth daily.  Dispense: 90 tablet; Refill: 3 - TSH + free T4  4. Gastroesophageal reflux disease without esophagitis - omeprazole (PRILOSEC) 40 MG capsule; Take 1 capsule (40 mg total) by mouth in the morning and at bedtime.  Dispense: 180 capsule; Refill: 1  5. B12 deficiency - B12 and Folate Panel - cyanocobalamin ((VITAMIN B-12)) injection 1,000 mcg  6. Mixed hyperlipidemia Continue Zetia   General Counseling: Colsen verbalizes understanding of the findings of todays visit and agrees with plan of  treatment. I have discussed any further diagnostic evaluation that may be needed or ordered today. We also reviewed his medications today. he has been encouraged to call the office with any questions or concerns that should arise related to todays visit.    Orders Placed This Encounter  Procedures   TSH + free T4   B12 and Folate Panel   POCT HgB A1C    Meds ordered this encounter  Medications   omeprazole (PRILOSEC) 40 MG capsule    Sig: Take 1 capsule (40 mg total) by mouth in the morning and at bedtime.    Dispense:  180 capsule    Refill:  1   levothyroxine (SYNTHROID) 50 MCG tablet    Sig: Take 1 tablet (50 mcg total) by mouth daily.    Dispense:  90 tablet    Refill:  3   cyanocobalamin ((VITAMIN B-12)) injection 1,000 mcg    This patient was seen by Drema Dallas, PA-C in collaboration with Dr. Clayborn Bigness as a part of collaborative care agreement.   Total time spent:30 Minutes Time spent includes review of chart, medications, test results, and follow up plan with the patient.      Dr Lavera Guise Internal medicine

## 2021-09-09 ENCOUNTER — Other Ambulatory Visit
Admission: RE | Admit: 2021-09-09 | Discharge: 2021-09-09 | Disposition: A | Discharge status: Home / Self Care | Payer: Medicare Other | Attending: Physician Assistant | Admitting: Physician Assistant

## 2021-09-09 DIAGNOSIS — E1165 Type 2 diabetes mellitus with hyperglycemia: Secondary | ICD-10-CM | POA: Insufficient documentation

## 2021-09-09 DIAGNOSIS — E039 Hypothyroidism, unspecified: Secondary | ICD-10-CM | POA: Diagnosis not present

## 2021-09-09 DIAGNOSIS — E538 Deficiency of other specified B group vitamins: Secondary | ICD-10-CM | POA: Diagnosis not present

## 2021-09-09 LAB — VITAMIN B12: Vitamin B-12: 147 pg/mL — ABNORMAL LOW (ref 180–914)

## 2021-09-09 LAB — FOLATE: Folate: 34 ng/mL (ref >5.9)

## 2021-09-09 LAB — T4, FREE: Free T4: 0.9 ng/dL (ref 0.61–1.12)

## 2021-09-09 LAB — TSH: TSH: 6.805 u[IU]/mL — ABNORMAL HIGH (ref 0.350–4.500)

## 2021-09-10 LAB — HEMOGLOBIN A1C
Hgb A1c MFr Bld: 7.3 % — ABNORMAL HIGH (ref 4.8–5.6)
Mean Plasma Glucose: 162.81 mg/dL

## 2021-09-23 ENCOUNTER — Ambulatory Visit (INDEPENDENT_AMBULATORY_CARE_PROVIDER_SITE_OTHER): Payer: Medicare Other | Admitting: Physician Assistant

## 2021-09-23 ENCOUNTER — Encounter: Payer: Self-pay | Admitting: Physician Assistant

## 2021-09-23 DIAGNOSIS — E1165 Type 2 diabetes mellitus with hyperglycemia: Secondary | ICD-10-CM | POA: Diagnosis not present

## 2021-09-23 DIAGNOSIS — I10 Essential (primary) hypertension: Secondary | ICD-10-CM | POA: Diagnosis not present

## 2021-09-23 DIAGNOSIS — E538 Deficiency of other specified B group vitamins: Secondary | ICD-10-CM

## 2021-09-23 DIAGNOSIS — E039 Hypothyroidism, unspecified: Secondary | ICD-10-CM

## 2021-09-23 MED ORDER — CYANOCOBALAMIN 1000 MCG/ML IJ SOLN
1000.0000 ug | Freq: Once | INTRAMUSCULAR | Status: AC
  Administered 2021-09-23: 1000 ug via INTRAMUSCULAR

## 2021-09-23 NOTE — Progress Notes (Signed)
[facility] ?[address] ?[place], [place] [address] ? ?Internal MEDICINE  ?Office Visit Note ? ?Patient Name: Travis Higgins ? [id number]  ?[id number] ? ?Date of Service: 09/23/2021 ? ?Chief Complaint  ?Patient presents with  ? Follow-up  ? Diabetes  ? Hypertension  ? Quality Metric Gaps  ?  Shingles Vaccine  ? ? ?HPI ?Pt is here for routine follow up ?-Labs reviewed and A1c still the same at 7.3. States he will be starting to walk much more regularly in about a month once pollen calms down and summer months. Should see improvement in A1c next check, does not want to change any meds since stable at 7.3 and should be improving with exercise. ?-TSH elevated, but free T4 back in normal range now since lowering synthroid dose from 75 to 49mg. Will plan to recheck labs in 2-3 months to see whether dose needs to be adjusted back up.  ?-B12 still low and continues injections ?-Will have Shingles and PNA vaccines done at health dept and notify office ? ?Current Medication: ?Outpatient Encounter Medications as of 09/23/2021  ?Medication Sig  ? aspirin EC 81 MG tablet Take 81 mg by mouth daily. Swallow whole.  ? ezetimibe (ZETIA) 10 MG tablet TAKE ONE TABLET BY MOUTH DAILY  ? glipiZIDE (GLUCOTROL) 5 MG tablet Take 1 tablet (5 mg total) by mouth 2 (two) times daily.  ? levothyroxine (SYNTHROID) 50 MCG tablet Take 1 tablet (50 mcg total) by mouth daily.  ? omeprazole (PRILOSEC) 40 MG capsule Take 1 capsule (40 mg total) by mouth in the morning and at bedtime.  ? ramipril (ALTACE) 2.5 MG capsule Take 1 capsule (2.5 mg total) by mouth daily.  ? Zoster Vaccine Adjuvanted ([facility] injection Inject 0.5 mLs into the muscle once.  ? [EXPIRED] cyanocobalamin ((VITAMIN B-12)) injection 1,000 mcg   ? ?No facility-administered encounter medications on file as of 09/23/2021.  ? ? ?Surgical History: ?Past Surgical History:  ?Procedure Laterality Date  ? COLONOSCOPY    ? COLONOSCOPY WITH PROPOFOL N/A 09/20/2015  ? Procedure:  COLONOSCOPY WITH PROPOFOL;  Surgeon: DLucilla Lame MD;  [place] [facility]  Service: Endoscopy;  Laterality: N/A;  ? COLONOSCOPY WITH PROPOFOL N/A 07/05/2020  ? Procedure: COLONOSCOPY WITH PROPOFOL;  Surgeon: WLucilla Lame MD;  [facility]  Service: Endoscopy;  Laterality: N/A;  Diabetic - oral meds  ? POLYPECTOMY  09/20/2015  ? Procedure: POLYPECTOMY INTESTINAL;  Surgeon: DLucilla Lame MD;  [place] [facility]  Service: Endoscopy;;  Sigmoid colon polyp x 1 ?Rectal polyp x 2  ? POLYPECTOMY  07/05/2020  ? Procedure: POLYPECTOMY;  Surgeon: WLucilla Lame MD;  [place] [facility]  Service: Endoscopy;;  ? ? ?Medical History: ?Past Medical History:  ?Diagnosis Date  ? Anemia   ? in past  ? CAD (coronary artery disease)   ? [place] showed mild nonobstructive disease in the mid LAD and distal left circumflex with normal ejection fraction.  ? Diabetes mellitus without complication ([place]   ? Hypertension   ? Hypothyroidism   ? Thyroid disease   ? ? ?Family History: ?Family History  ?Family history unknown: Yes  ? ? ?Social History  ? ?Socioeconomic History  ? Marital status: Married  ?  Spouse name: Not on file  ? Number of children: Not on file  ? Years of education: Not on file  ? Highest education level: Not on file  ?Occupational History  ?  Not on file  ?Tobacco Use  ? Smoking status: Former  ? Smokeless tobacco: Never  ? Tobacco comments:  ?  quit approx 2013  ?Vaping Use  ? Vaping Use: Never used  ?Substance and Sexual Activity  ? Alcohol use: No  ? Drug use: No  ? Sexual activity: Not on file  ?Other Topics Concern  ? Not on file  ?Social History Narrative  ? Not on file  ? ?Social Determinants of Health  ? ?Financial Resource Strain: Not on file  ?Food Insecurity: Not on file  ?Transportation Needs: Not on file  ?Physical Activity: Not on file  ?Stress: Not on file  ?Social Connections: Not on file   ?Intimate Partner Violence: Not on file  ? ? ? ? ?Review of Systems  ?Constitutional:  Negative for chills, fatigue and unexpected weight change.  ?HENT:  Negative for congestion, postnasal drip, rhinorrhea, sneezing and sore throat.   ?Eyes:  Negative for redness.  ?Respiratory:  Negative for cough, chest tightness and shortness of breath.   ?Cardiovascular:  Negative for chest pain and palpitations.  ?Gastrointestinal:  Negative for abdominal pain, constipation, diarrhea, nausea and vomiting.  ?Genitourinary:  Negative for dysuria and frequency.  ?Musculoskeletal:  Negative for arthralgias, back pain, joint swelling and neck pain.  ?Skin:  Negative for rash.  ?Neurological: Negative.  Negative for tremors and numbness.  ?Hematological:  Negative for adenopathy. Does not bruise/bleed easily.  ?Psychiatric/Behavioral:  Negative for behavioral problems (Depression), sleep disturbance and suicidal ideas. The patient is not nervous/anxious.   ? ?Vital Signs: ?BP (!) 147/81   Pulse 66   Temp 98.4 ?F (36.9 ?C)   Resp 16   Ht '5\' 5"'$  (1.651 m)   Wt 175 lb 12.8 oz (79.7 kg)   SpO2 93%   BMI 29.25 kg/m?  ? ? ?Physical Exam ?Vitals and nursing note reviewed.  ?Constitutional:   ?   General: He is not in acute distress. ?   Appearance: He is well-developed. He is not diaphoretic.  ?HENT:  ?   Head: Normocephalic and atraumatic.  ?   Mouth/Throat:  ?   Pharynx: No oropharyngeal exudate.  ?Eyes:  ?   Pupils: Pupils are equal, round, and reactive to light.  ?Neck:  ?   Thyroid: No thyromegaly.  ?   Vascular: No JVD.  ?   Trachea: No tracheal deviation.  ?Cardiovascular:  ?   Rate and Rhythm: Normal rate and regular rhythm.  ?   Heart sounds: Normal heart sounds. No murmur heard. ?  No friction rub. No gallop.  ?Pulmonary:  ?   Effort: Pulmonary effort is normal. No respiratory distress.  ?   Breath sounds: No wheezing or rales.  ?Chest:  ?   Chest wall: No tenderness.  ?Abdominal:  ?   General: Bowel sounds are normal.  ?    Palpations: Abdomen is soft.  ?Musculoskeletal:     ?   General: Normal range of motion.  ?   Cervical back: Normal range of motion and neck supple.  ?Lymphadenopathy:  ?   Cervical: No cervical adenopathy.  ?Skin: ?   General: Skin is warm and dry.  ?Neurological:  ?   Mental Status: He is alert and oriented to person, place, and time.  ?   Cranial Nerves: No cranial nerve deficit.  ?Psychiatric:     ?   Behavior: Behavior normal.     ?   Thought Content: Thought content normal.     ?  Judgment: Judgment normal.  ? ? ? ? ? ?Assessment/Plan: ?1. Type 2 diabetes mellitus with hyperglycemia, without long-term current use of insulin ([place]) ?A1c is stable at 7.3.  Patient declines medication changes and wants to work on increasing his exercise as he normally does in the summer months.  We will continue current medication and again encouraged improvement in diet and exercise ? ?2. Essential hypertension ?Slightly elevated in office today, continue to monitor and continue current medications ? ?3. Acquired hypothyroidism ?Repeat labs show an increase in TSH but improvement in free T4.  We will continue on lower dose of Synthroid as before and order repeat labs to be done in 2 to 3 months and adjust dose as indicated ?- TSH + free T4 ? ?4. B12 deficiency ?- cyanocobalamin ((VITAMIN B-12)) injection 1,000 mcg ? ? ?General Counseling: [place] verbalizes understanding of the findings of todays visit and agrees with plan of treatment. I have discussed any further diagnostic evaluation that may be needed or ordered today. We also reviewed his medications today. he has been encouraged to call the office with any questions or concerns that should arise related to todays visit. ? ? ? ?Orders Placed This Encounter  ?Procedures  ? TSH + free T4  ? ? ?Meds ordered this encounter  ?Medications  ? cyanocobalamin ((VITAMIN B-12)) injection 1,000 mcg  ? ? ?This patient was seen by Drema Dallas, PA-C in collaboration with Dr. Clayborn Bigness as a part of collaborative care agreement. ? ? ?Total time spent:30 Minutes ?Time spent includes review of chart, medications, test results, and follow up plan with the patient.  ? ? ? ? ?Dr Lavera Guise ?I

## 2021-10-02 ENCOUNTER — Other Ambulatory Visit: Payer: Self-pay | Admitting: Physician Assistant

## 2021-10-02 DIAGNOSIS — E1165 Type 2 diabetes mellitus with hyperglycemia: Secondary | ICD-10-CM

## 2021-10-02 DIAGNOSIS — I10 Essential (primary) hypertension: Secondary | ICD-10-CM

## 2021-10-03 ENCOUNTER — Other Ambulatory Visit: Payer: Self-pay

## 2021-10-03 DIAGNOSIS — I10 Essential (primary) hypertension: Secondary | ICD-10-CM

## 2021-10-03 DIAGNOSIS — E1165 Type 2 diabetes mellitus with hyperglycemia: Secondary | ICD-10-CM

## 2021-10-03 MED ORDER — GLIPIZIDE 5 MG PO TABS: 5.0000 mg | ORAL_TABLET | Freq: Two times a day (BID) | ORAL | 1 refills | Status: DC

## 2021-10-03 MED ORDER — RAMIPRIL 2.5 MG PO CAPS: 2.5000 mg | ORAL_CAPSULE | Freq: Every day | ORAL | 1 refills | Status: DC

## 2021-11-03 ENCOUNTER — Encounter: Payer: Self-pay | Admitting: Internal Medicine

## 2021-12-14 ENCOUNTER — Other Ambulatory Visit
Admission: RE | Admit: 2021-12-14 | Discharge: 2021-12-14 | Disposition: A | Discharge status: Home / Self Care | Payer: Medicare Other | Attending: Physician Assistant | Admitting: Physician Assistant

## 2021-12-14 DIAGNOSIS — E039 Hypothyroidism, unspecified: Secondary | ICD-10-CM | POA: Diagnosis not present

## 2021-12-14 LAB — T4, FREE: Free T4: 0.94 ng/dL (ref 0.61–1.12)

## 2021-12-14 LAB — TSH: TSH: 4.879 u[IU]/mL — ABNORMAL HIGH (ref 0.350–4.500)

## 2021-12-23 ENCOUNTER — Ambulatory Visit (INDEPENDENT_AMBULATORY_CARE_PROVIDER_SITE_OTHER): Payer: Medicare Other | Admitting: Physician Assistant

## 2021-12-23 ENCOUNTER — Telehealth: Payer: Self-pay

## 2021-12-23 ENCOUNTER — Encounter: Payer: Self-pay | Admitting: Physician Assistant

## 2021-12-23 VITALS — BP 134/77 | HR 50 | Temp 97.6°F | Resp 16 | Ht 65.0 in | Wt 168.6 lb

## 2021-12-23 DIAGNOSIS — E538 Deficiency of other specified B group vitamins: Secondary | ICD-10-CM

## 2021-12-23 DIAGNOSIS — I10 Essential (primary) hypertension: Secondary | ICD-10-CM

## 2021-12-23 DIAGNOSIS — E1165 Type 2 diabetes mellitus with hyperglycemia: Secondary | ICD-10-CM

## 2021-12-23 DIAGNOSIS — E039 Hypothyroidism, unspecified: Secondary | ICD-10-CM | POA: Diagnosis not present

## 2021-12-23 LAB — POCT GLYCOSYLATED HEMOGLOBIN (HGB A1C): Hemoglobin A1C: 7.5 % — AB (ref 4.0–5.6)

## 2021-12-23 MED ORDER — CYANOCOBALAMIN 1000 MCG/ML IJ SOLN
1000.0000 ug | Freq: Once | INTRAMUSCULAR | Status: AC
  Administered 2021-12-23: 1000 ug via INTRAMUSCULAR

## 2021-12-23 NOTE — Progress Notes (Signed)
[facility] [place], [place] [address]  Internal MEDICINE  Office Visit Note  Patient Name: Travis Higgins  [id number]  [id number]  Date of Service: 12/23/2021  Chief Complaint  Patient presents with   Follow-up   Diabetes    Requesting diabetic shoes   Hypertension    HPI Pt is here for routine follow up -BG not usually checked at home, the times it is checked have been good. -Takes glipizide BID and really does not want to add any new medications despite A1c rising. -Walking a few miles per day, but does admit to sitting at home and eating more with being home during summer -He and his wife will be traveling to [place] from Sept-Dec and will schedule follow up once they return. -Discussed the importance of getting A1c under better control and need for adjusting medications if diet/exercise changes not effective. Will discuss next visit once they have returned from traveling. -Does need script for diabetic shoes and will go ahead and fill out now with foot exam today. He does have a bunion on right foot and describes some burning in feet indicative of neuropathy, with sensation slightly decreased. Callous and dry skin also present.  Current Medication: Outpatient Encounter Medications as of 12/23/2021  Medication Sig   aspirin EC 81 MG tablet Take 81 mg by mouth daily. Swallow whole.   ezetimibe (ZETIA) 10 MG tablet TAKE ONE TABLET BY MOUTH DAILY   glipiZIDE (GLUCOTROL) 5 MG tablet Take 1 tablet (5 mg total) by mouth 2 (two) times daily.   levothyroxine (SYNTHROID) 50 MCG tablet Take 1 tablet (50 mcg total) by mouth daily.   omeprazole (PRILOSEC) 40 MG capsule Take 1 capsule (40 mg total) by mouth in the morning and at bedtime.   ramipril (ALTACE) 2.5 MG capsule Take 1 capsule (2.5 mg total) by mouth daily.   Zoster Vaccine Adjuvanted [facility]) injection Inject 0.5 mLs into the muscle once.   [EXPIRED] cyanocobalamin ((VITAMIN B-12)) injection 1,000 mcg     No facility-administered encounter medications on file as of 12/23/2021.    Surgical History: Past Surgical History:  Procedure Laterality Date   COLONOSCOPY     COLONOSCOPY WITH PROPOFOL N/A 09/20/2015   Procedure: COLONOSCOPY WITH PROPOFOL;  Surgeon: Lucilla Lame, MD;  Location: [place];  Service: Endoscopy;  Laterality: N/A;   COLONOSCOPY WITH PROPOFOL N/A 07/05/2020   Procedure: COLONOSCOPY WITH PROPOFOL;  Surgeon: Lucilla Lame, MD;  Location: [place];  Service: Endoscopy;  Laterality: N/A;  Diabetic - oral meds   POLYPECTOMY  09/20/2015   Procedure: POLYPECTOMY INTESTINAL;  Surgeon: Lucilla Lame, MD;  Location: [place];  Service: Endoscopy;;  Sigmoid colon polyp x 1 Rectal polyp x 2   POLYPECTOMY  07/05/2020   Procedure: POLYPECTOMY;  Surgeon: Lucilla Lame, MD;  Location: [facility];  Service: Endoscopy;;    Medical History: Past Medical History:  Diagnosis Date   Anemia    in past   CAD (coronary artery disease)    Cardiac catheterization done in July 2015 in [place] by Dr. Jori Moll fields showed mild nonobstructive disease in the mid LAD and distal left circumflex with normal ejection fraction.   Diabetes mellitus without complication ([place])    Hypertension    Hypothyroidism    Thyroid disease     Family History: Family History  Family history unknown: Yes    Social History   Socioeconomic History   Marital status: Married    Spouse name: Not  on file   Number of children: Not on file   Years of education: Not on file   Highest education level: Not on file  Occupational History   Not on file  Tobacco Use   Smoking status: Former   Smokeless tobacco: Never   Tobacco comments:    quit approx 2013  Vaping Use   Vaping Use: Never used  Substance and Sexual Activity   Alcohol use: No   Drug use: No   Sexual activity: Not on file  Other Topics Concern   Not on file  Social History Narrative   Not on file    Social Determinants of Health   Financial Resource Strain: Not on file  Food Insecurity: Not on file  Transportation Needs: Not on file  Physical Activity: Not on file  Stress: Not on file  Social Connections: Not on file  Intimate Partner Violence: Not on file      Review of Systems  Constitutional:  Negative for chills, fatigue and unexpected weight change.  HENT:  Negative for congestion, postnasal drip, rhinorrhea, sneezing and sore throat.   Eyes:  Negative for redness.  Respiratory:  Negative for cough, chest tightness and shortness of breath.   Cardiovascular:  Negative for chest pain and palpitations.  Gastrointestinal:  Negative for abdominal pain, constipation, diarrhea, nausea and vomiting.  Genitourinary:  Negative for dysuria and frequency.  Musculoskeletal:  Negative for arthralgias, back pain, joint swelling and neck pain.  Skin:  Negative for rash.  Neurological: Negative.  Negative for tremors and numbness.  Hematological:  Negative for adenopathy. Does not bruise/bleed easily.  Psychiatric/Behavioral:  Negative for behavioral problems (Depression), sleep disturbance and suicidal ideas. The patient is not nervous/anxious.     Vital Signs: BP 134/77   Pulse (!) 50   Temp 97.6 F (36.4 C)   Resp 16   Ht '5\' 5"'$  (1.651 m)   Wt 168 lb 9.6 oz (76.5 kg)   SpO2 93%   BMI 28.06 kg/m    Physical Exam Vitals and nursing note reviewed.  Constitutional:      General: He is not in acute distress.    Appearance: He is well-developed. He is not diaphoretic.  HENT:     Head: Normocephalic and atraumatic.     Mouth/Throat:     Pharynx: No oropharyngeal exudate.  Eyes:     Pupils: Pupils are equal, round, and reactive to light.  Neck:     Thyroid: No thyromegaly.     Vascular: No JVD.     Trachea: No tracheal deviation.  Cardiovascular:     Rate and Rhythm: Normal rate and regular rhythm.     Heart sounds: Normal heart sounds. No murmur heard.    No  friction rub. No gallop.  Pulmonary:     Effort: Pulmonary effort is normal. No respiratory distress.     Breath sounds: No wheezing or rales.  Chest:     Chest wall: No tenderness.  Abdominal:     General: Bowel sounds are normal.     Palpations: Abdomen is soft.  Musculoskeletal:        General: Normal range of motion.     Cervical back: Normal range of motion and neck supple.     Right foot: Bunion present.  Feet:     Right foot:     Protective Sensation: 3 sites tested.  2 sites sensed.     Skin integrity: Callus and dry skin present.     Toenail  Condition: Right toenails are abnormally thick.     Left foot:     Protective Sensation: 3 sites tested.  2 sites sensed.     Skin integrity: Callus and dry skin present.     Toenail Condition: Left toenails are abnormally thick.  Lymphadenopathy:     Cervical: No cervical adenopathy.  Skin:    General: Skin is warm and dry.  Neurological:     Mental Status: He is alert and oriented to person, place, and time.     Cranial Nerves: No cranial nerve deficit.  Psychiatric:        Behavior: Behavior normal.        Thought Content: Thought content normal.        Judgment: Judgment normal.        Assessment/Plan: 1. Type 2 diabetes mellitus with hyperglycemia, without long-term current use of insulin (H[facility]) - POCT HgB A1C is 7.5 which is increased from 7.3 last check. Again discussed adjusting medication, however pt declines and really wants to work on diet and exercise now, especially with upcoming travel plans and will discuss adjustment next visit upon return if A1c not improved. Continue Glipizide BID. Does have some polyneuropathy with callous formation along big toes and bunion on right foot. Will complete order form for diabetic shoes and fax as requested.  2. Essential hypertension Stable, continue current medication  3. Acquired hypothyroidism Tsh slightly elevated though improved and T4 in range, will continue to monitor  and continue current synthroid dose  4. B12 deficiency - cyanocobalamin ((VITAMIN B-12)) injection 1,000 mcg   General Counseling: [place] verbalizes understanding of the findings of todays visit and agrees with plan of treatment. I have discussed any further diagnostic evaluation that may be needed or ordered today. We also reviewed his medications today. he has been encouraged to call the office with any questions or concerns that should arise related to todays visit.    Orders Placed This Encounter  Procedures   POCT HgB A1C    Meds ordered this encounter  Medications   cyanocobalamin ((VITAMIN B-12)) injection 1,000 mcg    This patient was seen by Drema Dallas, PA-C in collaboration with Dr. Clayborn Bigness as a part of collaborative care agreement.   Total time spent:30 Minutes Time spent includes review of chart, medications, test results, and follow up plan with the patient.      Dr Lavera Guise Internal medicine

## 2021-12-23 NOTE — Telephone Encounter (Signed)
Faxed diabetic shoe paperwork to [facility]

## 2022-01-01 DIAGNOSIS — Z23 Encounter for immunization: Secondary | ICD-10-CM | POA: Diagnosis not present

## 2022-02-10 ENCOUNTER — Other Ambulatory Visit: Payer: Self-pay | Admitting: Nurse Practitioner

## 2022-02-10 DIAGNOSIS — I10 Essential (primary) hypertension: Secondary | ICD-10-CM

## 2022-02-10 DIAGNOSIS — E039 Hypothyroidism, unspecified: Secondary | ICD-10-CM

## 2022-02-10 DIAGNOSIS — E1165 Type 2 diabetes mellitus with hyperglycemia: Secondary | ICD-10-CM

## 2022-02-10 MED ORDER — GLIPIZIDE 5 MG PO TABS: 5.0000 mg | ORAL_TABLET | Freq: Two times a day (BID) | ORAL | 1 refills | Status: DC

## 2022-02-10 MED ORDER — EZETIMIBE 10 MG PO TABS: 10.0000 mg | ORAL_TABLET | Freq: Every day | ORAL | 3 refills | Status: DC

## 2022-02-10 MED ORDER — RAMIPRIL 2.5 MG PO CAPS: 2.5000 mg | ORAL_CAPSULE | Freq: Every day | ORAL | 1 refills | Status: DC

## 2022-02-10 MED ORDER — LEVOTHYROXINE SODIUM 50 MCG PO TABS: 50.0000 ug | ORAL_TABLET | Freq: Every day | ORAL | 3 refills | Status: DC

## 2022-03-06 ENCOUNTER — Ambulatory Visit (INDEPENDENT_AMBULATORY_CARE_PROVIDER_SITE_OTHER): Payer: Medicare Other | Admitting: Vascular Surgery

## 2022-03-06 ENCOUNTER — Encounter (INDEPENDENT_AMBULATORY_CARE_PROVIDER_SITE_OTHER): Payer: Medicare Other

## 2022-03-22 ENCOUNTER — Other Ambulatory Visit: Payer: Self-pay | Admitting: Physician Assistant

## 2022-03-22 DIAGNOSIS — K219 Gastro-esophageal reflux disease without esophagitis: Secondary | ICD-10-CM

## 2022-04-10 ENCOUNTER — Encounter (INDEPENDENT_AMBULATORY_CARE_PROVIDER_SITE_OTHER): Payer: Self-pay

## 2022-04-13 ENCOUNTER — Ambulatory Visit (INDEPENDENT_AMBULATORY_CARE_PROVIDER_SITE_OTHER): Payer: Medicare Other | Admitting: Vascular Surgery

## 2022-04-13 ENCOUNTER — Encounter (INDEPENDENT_AMBULATORY_CARE_PROVIDER_SITE_OTHER): Payer: Medicare Other

## 2022-04-17 ENCOUNTER — Ambulatory Visit: Payer: Medicare Other | Admitting: Physician Assistant

## 2022-05-23 DIAGNOSIS — Z23 Encounter for immunization: Secondary | ICD-10-CM | POA: Diagnosis not present

## 2022-05-30 NOTE — Progress Notes (Unsigned)
MRN : [id number]  Travis Higgins is a 71 y.o. (November 12, 1950) male who presents with chief complaint of check carotid arteries.  History of Present Illness:   The patient is seen for follow up evaluation of carotid stenosis. The carotid stenosis followed by ultrasound.    The patient denies amaurosis fugax. There is no recent history of TIA symptoms or focal motor deficits. There is no prior documented CVA.   The patient is taking enteric-coated aspirin 81 mg daily.   There is no history of migraine headaches. There is no history of seizures.   The patient does not have a history of coronary artery disease, no recent episodes of angina or shortness of breath. The patient denies PAD or claudication symptoms. There is a history of hyperlipidemia which is being treated with a statin.    Previous CT of the neck Dated February 18, 2020 is reviewed by me.  It is also shown to the patient and his wife.  Although the radiologist read is 70% for the right internal carotid artery by my measurement is closer to 55 to 60%.  Left side is less than 50%.    Carotid Duplex done today shows RICA 40-98% and LICA <11%.  No change compared to last study. Very Stable.   No outpatient medications have been marked as taking for the 06/01/22 encounter (Appointment) with Delana Meyer, Dolores Lory, MD.    Past Medical History:  Diagnosis Date   Anemia    in past   CAD (coronary artery disease)    Cardiac catheterization done in July 2015 in [place] by Dr. Jori Moll fields showed mild nonobstructive disease in the mid LAD and distal left circumflex with normal ejection fraction.   Diabetes mellitus without complication ([place])    Hypertension    Hypothyroidism    Thyroid disease     Past Surgical History:  Procedure Laterality Date   COLONOSCOPY     COLONOSCOPY WITH PROPOFOL N/A 09/20/2015   Procedure: COLONOSCOPY WITH PROPOFOL;  Surgeon: Lucilla Lame, MD;  Location: [facility];  Service:  Endoscopy;  Laterality: N/A;   COLONOSCOPY WITH PROPOFOL N/A 07/05/2020   Procedure: COLONOSCOPY WITH PROPOFOL;  Surgeon: Lucilla Lame, MD;  Location: [facility];  Service: Endoscopy;  Laterality: N/A;  Diabetic - oral meds   POLYPECTOMY  09/20/2015   Procedure: POLYPECTOMY INTESTINAL;  Surgeon: Lucilla Lame, MD;  Location: [place] [facility];  Service: Endoscopy;;  Sigmoid colon polyp x 1 Rectal polyp x 2   POLYPECTOMY  07/05/2020   Procedure: POLYPECTOMY;  Surgeon: Lucilla Lame, MD;  Location: [facility];  Service: Endoscopy;;    Social History Social History   Tobacco Use   Smoking status: Former   Smokeless tobacco: Never   Tobacco comments:    quit approx 2013  Vaping Use   Vaping Use: Never used  Substance Use Topics   Alcohol use: No   Drug use: No    Family History Family History  Family history unknown: Yes    Allergies  Allergen Reactions   Levothyroxine      REVIEW OF SYSTEMS (Negative unless checked)  Constitutional: '[]'$ Weight loss  '[]'$ Fever  '[]'$ Chills Cardiac: '[]'$ Chest pain   '[]'$ Chest pressure   '[]'$ Palpitations   '[]'$ Shortness of breath when laying flat   '[]'$ Shortness of breath with exertion. Vascular:  '[x]'$ Pain in legs with walking   '[]'$ Pain in legs at rest  '[]'$ History of DVT   '[]'$ Phlebitis   '[]'$ Swelling in legs   '[]'$ Varicose veins   '[]'$   Non-healing ulcers Pulmonary:   '[]'$ Uses home oxygen   '[]'$ Productive cough   '[]'$ Hemoptysis   '[]'$ Wheeze  '[]'$ COPD   '[]'$ Asthma Neurologic:  '[]'$ Dizziness   '[]'$ Seizures   '[]'$ History of stroke   '[]'$ History of TIA  '[]'$ Aphasia   '[]'$ Vissual changes   '[]'$ Weakness or numbness in arm   '[]'$ Weakness or numbness in leg Musculoskeletal:   '[]'$ Joint swelling   '[]'$ Joint pain   '[]'$ Low back pain Hematologic:  '[]'$ Easy bruising  '[]'$ Easy bleeding   '[]'$ Hypercoagulable state   '[]'$ Anemic Gastrointestinal:  '[]'$ Diarrhea   '[]'$ Vomiting  '[]'$ Gastroesophageal reflux/heartburn   '[]'$ Difficulty swallowing. Genitourinary:  '[]'$ Chronic kidney disease   '[]'$ Difficult urination  '[]'$ Frequent  urination   '[]'$ Blood in urine Skin:  '[]'$ Rashes   '[]'$ Ulcers  Psychological:  '[]'$ History of anxiety   '[]'$  History of major depression.  Physical Examination  There were no vitals filed for this visit. There is no height or weight on file to calculate BMI. Gen: WD/WN, NAD Head: [place]/AT, No temporalis wasting.  Ear/Nose/Throat: Hearing grossly intact, nares w/o erythema or drainage Eyes: PER, EOMI, sclera nonicteric.  Neck: Supple, no masses.  No bruit or JVD.  Pulmonary:  Good air movement, no audible wheezing, no use of accessory muscles.  Cardiac: RRR, normal S1, S2, no Murmurs. Vascular:  carotid bruit noted Vessel Right Left  Radial Palpable Palpable  Carotid  Palpable  Palpable  Subclav  Palpable Palpable  Gastrointestinal: soft, non-distended. No guarding/no peritoneal signs.  Musculoskeletal: M/S 5/5 throughout.  No visible deformity.  Neurologic: CN 2-12 intact. Pain and light touch intact in extremities.  Symmetrical.  Speech is fluent. Motor exam as listed above. Psychiatric: Judgment intact, Mood & affect appropriate for pt's clinical situation. Dermatologic: No rashes or ulcers noted.  No changes consistent with cellulitis.   CBC Lab Results  Component Value Date   WBC 5.6 06/25/2021   HGB 14.5 06/25/2021   HCT 44.5 06/25/2021   MCV 83.3 06/25/2021   PLT 214 06/25/2021    BMET    Component Value Date/Time   NA 133 (L) 06/25/2021 0859   K 4.2 06/25/2021 0859   CL 102 06/25/2021 0859   CO2 26 06/25/2021 0859   GLUCOSE 85 06/25/2021 0859   BUN 10 06/25/2021 0859   CREATININE 1.00 06/25/2021 0859   CALCIUM 9.0 06/25/2021 0859   GFRNONAA >60 06/25/2021 0859   GFRAA >60 02/14/2020 0739   CrCl cannot be calculated (Patient's most recent lab result is older than the maximum 21 days allowed.).  COAG No results found for: "INR", "PROTIME"  Radiology No results found.   Assessment/Plan There are no diagnoses linked to this encounter.   Hortencia Pilar,  MD  05/30/2022 3:04 PM

## 2022-05-31 ENCOUNTER — Other Ambulatory Visit (INDEPENDENT_AMBULATORY_CARE_PROVIDER_SITE_OTHER): Payer: Self-pay | Admitting: Vascular Surgery

## 2022-05-31 DIAGNOSIS — I6523 Occlusion and stenosis of bilateral carotid arteries: Secondary | ICD-10-CM

## 2022-06-01 ENCOUNTER — Encounter (INDEPENDENT_AMBULATORY_CARE_PROVIDER_SITE_OTHER): Payer: Self-pay | Admitting: Vascular Surgery

## 2022-06-01 ENCOUNTER — Ambulatory Visit: Payer: Medicare Other | Attending: Cardiovascular Disease | Admitting: Cardiovascular Disease

## 2022-06-01 ENCOUNTER — Ambulatory Visit (INDEPENDENT_AMBULATORY_CARE_PROVIDER_SITE_OTHER): Payer: Medicare Other

## 2022-06-01 ENCOUNTER — Encounter: Payer: Self-pay | Admitting: Cardiovascular Disease

## 2022-06-01 ENCOUNTER — Ambulatory Visit (INDEPENDENT_AMBULATORY_CARE_PROVIDER_SITE_OTHER): Payer: Medicare Other | Admitting: Vascular Surgery

## 2022-06-01 VITALS — BP 138/70 | HR 52 | Ht 65.0 in | Wt 169.4 lb

## 2022-06-01 VITALS — BP 138/79 | HR 51 | Resp 14 | Ht 65.0 in | Wt 169.0 lb

## 2022-06-01 DIAGNOSIS — E785 Hyperlipidemia, unspecified: Secondary | ICD-10-CM | POA: Diagnosis not present

## 2022-06-01 DIAGNOSIS — I6523 Occlusion and stenosis of bilateral carotid arteries: Secondary | ICD-10-CM | POA: Diagnosis not present

## 2022-06-01 DIAGNOSIS — I779 Disorder of arteries and arterioles, unspecified: Secondary | ICD-10-CM | POA: Insufficient documentation

## 2022-06-01 DIAGNOSIS — I2510 Atherosclerotic heart disease of native coronary artery without angina pectoris: Secondary | ICD-10-CM | POA: Insufficient documentation

## 2022-06-01 DIAGNOSIS — E782 Mixed hyperlipidemia: Secondary | ICD-10-CM | POA: Diagnosis not present

## 2022-06-01 DIAGNOSIS — E1140 Type 2 diabetes mellitus with diabetic neuropathy, unspecified: Secondary | ICD-10-CM | POA: Diagnosis not present

## 2022-06-01 DIAGNOSIS — I10 Essential (primary) hypertension: Secondary | ICD-10-CM

## 2022-06-01 MED ORDER — ATORVASTATIN CALCIUM 10 MG PO TABS: 10.0000 mg | ORAL_TABLET | Freq: Every day | ORAL | 3 refills | Status: DC

## 2022-06-01 MED ORDER — ATORVASTATIN CALCIUM 10 MG PO TABS: 10.0000 mg | ORAL_TABLET | Freq: Every day | ORAL | 3 refills | Status: AC

## 2022-06-01 NOTE — Patient Instructions (Signed)
Medication Instructions:  START Atorvastatin 10 mg once daily  *If you need a refill on your cardiac medications before your next appointment, please call your pharmacy*   Lab Work: Your provider would like for you to return in 2 months to have the following labs drawn: fasting Lipid and Liver.   Please go to the [facility] entrance and check in at the front desk.  You do not need an appointment.  They are open from 7am-6 pm.  You will need to be fasting.  If you have labs (blood work) drawn today and your tests are completely normal, you will receive your results only by: [place] (if you have MyChart) OR A paper copy in the mail If you have any lab test that is abnormal or we need to change your treatment, we will call you to review the results.   Testing/Procedures: None ordered   Follow-Up: At [facility], you and your health needs are our priority.  As part of our continuing mission to provide you with exceptional heart care, we have created designated Provider Care Teams.  These Care Teams include your primary Cardiologist (physician) and Advanced Practice Providers (APPs -  Physician Assistants and Nurse Practitioners) who all work together to provide you with the care you need, when you need it.  We recommend signing up for the patient portal called "MyChart".  Sign up information is provided on this After Visit Summary.  MyChart is used to connect with patients for Virtual Visits (Telemedicine).  Patients are able to view lab/test results, encounter notes, upcoming appointments, etc.  Non-urgent messages can be sent to your provider as well.   To learn more about what you can do with MyChart, go to [email or web address].    Your next appointment:   12 month(s)  The format for your next appointment:   In Person  Provider:   You may see Kathlyn Sacramento, MD or one of the following Advanced Practice Providers on your designated Care Team:    Murray Hodgkins, NP Christell Faith, PA-C Cadence Kathlen Mody, PA-C Gerrie Nordmann, NP    Important Information About Sugar

## 2022-06-01 NOTE — Addendum Note (Signed)
Addended by: Ricci Barker on: 06/01/2022 09:10 AM   Modules accepted: Orders

## 2022-06-01 NOTE — Progress Notes (Signed)
Cardiology Office Note   Date:  06/01/2022   ID:  [name], [name] 1950/12/23, MRN [id number]  PCP:  Mylinda Latina, PA-C  Cardiologist:   Kathlyn Sacramento, MD   Chief Complaint  Patient presents with   Follow-up    12 month follow up. Patient states that he is doing good today with no complaints or concerns. Meds reviewed with patient.       History of Present Illness: Travis Higgins is a 71 y.o. male who is here today for follow-up visit regarding abnormal stress test and presumed coronary artery disease. He had previous cardiac catheterization in 2015 in [place] which showed mild nonobstructive disease affecting the LAD and left circumflex.  No revascularization was required.  He has prolonged history of type 2 diabetes, essential hypertension, hyperlipidemia and hypothyroidism.  He is a previous smoker with no family history of coronary artery disease.   He is a retired Radio producer.   He was evaluated in 2018 for exertional dyspnea. He underwent a treadmill nuclear stress test which was intermediate risk study with evidence of prior anterior/anteroseptal infarct as well as apical inferior defect.  The distribution was consistent with a distal LAD as well as OM branch territory.  EF was 49%. Echocardiogram showed an EF of 60 to 65% with no significant valvular abnormalities.  Cardiac catheterization was recommended but was declined by the patient given that his previous cath in 2015 did not show obstructive disease. He also has moderate carotid disease followed by vascular surgery.  He had myalgia with statins was prescribed Zetia last year with no issues.   He has been doing very well with no recent chest pain, shortness of breath or palpitations.  He takes his medications regularly.   Past Medical History:  Diagnosis Date   Anemia    in past   CAD (coronary artery disease)    Cardiac catheterization done in July 2015 in [place] by Dr. Jori Moll fields  showed mild nonobstructive disease in the mid LAD and distal left circumflex with normal ejection fraction.   Diabetes mellitus without complication ([place])    Hypertension    Hypothyroidism    Thyroid disease     Past Surgical History:  Procedure Laterality Date   COLONOSCOPY     COLONOSCOPY WITH PROPOFOL N/A 09/20/2015   Procedure: COLONOSCOPY WITH PROPOFOL;  Surgeon: Lucilla Lame, MD;  Location: [place];  Service: Endoscopy;  Laterality: N/A;   COLONOSCOPY WITH PROPOFOL N/A 07/05/2020   Procedure: COLONOSCOPY WITH PROPOFOL;  Surgeon: Lucilla Lame, MD;  Location: [place];  Service: Endoscopy;  Laterality: N/A;  Diabetic - oral meds   POLYPECTOMY  09/20/2015   Procedure: POLYPECTOMY INTESTINAL;  Surgeon: Lucilla Lame, MD;  Location: [place];  Service: Endoscopy;;  Sigmoid colon polyp x 1 Rectal polyp x 2   POLYPECTOMY  07/05/2020   Procedure: POLYPECTOMY;  Surgeon: Lucilla Lame, MD;  Location: [place];  Service: Endoscopy;;     Current Outpatient Medications  Medication Sig Dispense Refill   aspirin EC 81 MG tablet Take 81 mg by mouth daily. Swallow whole.     ezetimibe (ZETIA) 10 MG tablet Take 1 tablet (10 mg total) by mouth daily. 90 tablet 3   glipiZIDE (GLUCOTROL) 5 MG tablet Take 1 tablet (5 mg total) by mouth 2 (two) times daily. 180 tablet 1   levothyroxine (SYNTHROID) 50 MCG tablet Take 1 tablet (50 mcg total) by mouth daily. 90 tablet 3   omeprazole (PRILOSEC)  40 MG capsule TAKE ONE CAPSULE BY MOUTH IN THE MORNING AND AT BEDTIME 180 capsule 1   ramipril (ALTACE) 2.5 MG capsule Take 1 capsule (2.5 mg total) by mouth daily. 90 capsule 1   Zoster Vaccine Adjuvanted [facility]) injection Inject 0.5 mLs into the muscle once. (Patient not taking: Reported on 06/01/2022)     No current facility-administered medications for this visit.    Allergies:   Levothyroxine    Social History:  The patient  reports that he has quit smoking. He has  never used smokeless tobacco. He reports that he does not drink alcohol and does not use drugs.   Family History:  The patient's family history is negative for coronary artery disease.   ROS:  Please see the history of present illness.   Otherwise, review of systems are positive for none.   All other systems are reviewed and negative.    PHYSICAL EXAM: VS:  BP 138/70 (BP Location: Left Arm, Patient Position: Sitting, Cuff Size: Normal)   Pulse (!) 52   Ht '5\' 5"'$  (1.651 m)   Wt 169 lb 6.4 oz (76.8 kg)   SpO2 98%   BMI 28.19 kg/m  , BMI Body mass index is 28.19 kg/m. GEN: Well nourished, well developed, in no acute distress  HEENT: normal  Neck: no JVD, carotid bruits, or masses Cardiac: RRR; no murmurs, rubs, or gallops,no edema  Respiratory:  clear to auscultation bilaterally, normal work of breathing GI: soft, nontender, nondistended, + BS MS: no deformity or atrophy  Skin: warm and dry, no rash Neuro:  Strength and sensation are intact Psych: euthymic mood, full affect   EKG:  EKG is ordered today. The ekg ordered today demonstrates sinus bradycardia with first-degree AV block, right bundle branch block and possible old inferior infarct.    Recent Labs: 06/25/2021: ALT 23; BUN 10; Creatinine, Ser 1.00; Hemoglobin 14.5; Platelets 214; Potassium 4.2; Sodium 133 12/14/2021: TSH 4.879    Lipid Panel    Component Value Date/Time   CHOL 164 06/25/2021 0859   TRIG 118 06/25/2021 0859   HDL 31 (L) 06/25/2021 0859   CHOLHDL 5.3 06/25/2021 0859   VLDL 24 06/25/2021 0859   LDLCALC 109 (H) 06/25/2021 0859      Wt Readings from Last 3 Encounters:  06/01/22 169 lb 6.4 oz (76.8 kg)  12/23/21 168 lb 9.6 oz (76.5 kg)  09/23/21 175 lb 12.8 oz (79.7 kg)          05/24/2017    4:18 PM  PAD Screen  Previous PAD dx? No  Previous surgical procedure? No  Pain with walking? No  Feet/toe relief with dangling? No  Painful, non-healing ulcers? No  Extremities discolored? No       ASSESSMENT AND PLAN:  1.  Coronary artery disease involving native coronary arteries without angina: The patient is doing well overall with no anginal symptoms.  Continue medical therapy.  Continue aspirin 81 mg once daily.  2.  Hyperlipidemia: The patient had myalgia with rosuvastatin  He is tolerating Zetia without issues.  However, I reviewed his most recent lipid profile which showed an LDL of 108.  Given his known history of coronary artery disease, carotid gnosis and diabetes, will need to be more aggressive with his lipid management.  He is concerned about cost given that he does not have prescription coverage.  Will try atorvastatin 10 mg daily.  Recheck lipid and liver profile in 2 months.  3.  Essential hypertension: Blood pressure is  controlled on current medications.  4.  Carotid disease: Moderate and followed by vascular surgery.   Disposition:   FU with me in 12 months.   Signed,  Kathlyn Sacramento, MD  06/01/2022 8:49 AM    [facility]

## 2022-06-07 ENCOUNTER — Other Ambulatory Visit: Payer: Self-pay | Admitting: Nurse Practitioner

## 2022-06-07 DIAGNOSIS — Z23 Encounter for immunization: Secondary | ICD-10-CM | POA: Diagnosis not present

## 2022-06-07 DIAGNOSIS — K219 Gastro-esophageal reflux disease without esophagitis: Secondary | ICD-10-CM

## 2022-06-07 MED ORDER — OMEPRAZOLE 40 MG PO CPDR: DELAYED_RELEASE_CAPSULE | ORAL | 1 refills | Status: DC

## 2022-06-09 ENCOUNTER — Encounter: Payer: Self-pay | Admitting: Physician Assistant

## 2022-06-09 ENCOUNTER — Other Ambulatory Visit: Payer: Self-pay | Admitting: Physician Assistant

## 2022-06-09 ENCOUNTER — Ambulatory Visit (INDEPENDENT_AMBULATORY_CARE_PROVIDER_SITE_OTHER): Payer: Medicare Other | Admitting: Physician Assistant

## 2022-06-09 VITALS — BP 132/82 | HR 60 | Temp 98.0°F | Resp 16 | Ht 65.0 in | Wt 169.0 lb

## 2022-06-09 DIAGNOSIS — I10 Essential (primary) hypertension: Secondary | ICD-10-CM

## 2022-06-09 DIAGNOSIS — R5383 Other fatigue: Secondary | ICD-10-CM

## 2022-06-09 DIAGNOSIS — E039 Hypothyroidism, unspecified: Secondary | ICD-10-CM | POA: Diagnosis not present

## 2022-06-09 DIAGNOSIS — R300 Dysuria: Secondary | ICD-10-CM | POA: Diagnosis not present

## 2022-06-09 DIAGNOSIS — E538 Deficiency of other specified B group vitamins: Secondary | ICD-10-CM | POA: Diagnosis not present

## 2022-06-09 DIAGNOSIS — Z0001 Encounter for general adult medical examination with abnormal findings: Secondary | ICD-10-CM | POA: Diagnosis not present

## 2022-06-09 DIAGNOSIS — E1165 Type 2 diabetes mellitus with hyperglycemia: Secondary | ICD-10-CM

## 2022-06-09 LAB — POCT GLYCOSYLATED HEMOGLOBIN (HGB A1C): Hemoglobin A1C: 6.8 % — AB (ref 4.0–5.6)

## 2022-06-09 MED ORDER — CYANOCOBALAMIN 1000 MCG/ML IJ SOLN
1000.0000 ug | Freq: Once | INTRAMUSCULAR | Status: AC
  Administered 2022-06-09: 1000 ug via INTRAMUSCULAR

## 2022-06-09 NOTE — Progress Notes (Signed)
[facility] [place], [place] [address]  Internal MEDICINE  Office Visit Note  Patient Name: Travis Higgins  [id number]  [id number]  Date of Service: 06/09/2022  Chief Complaint  Patient presents with   Medicare Wellness   Diabetes   Hypertension     HPI Pt is here for routine health maintenance examination -Recently went to [place], got back to 3 weeks ago. States he was eating more sugars and less active yet his A1c greatly improved to his surprise. Will continue with current meds then, but is going to check cost of some newer meds such as farxiga, jardiance, or rybelsus in case needed in future. -he had an eye exam in [place] and can bring report -cardiology added lipitor for better LDL control given CAD, hx of carotid stenosis. He previously didn't tolerate statins and was started on zetia, but is going to retry. If tolerated may be able to d/c zetia in future if working well as cost is a concern for taking both meds.  Current Medication: Outpatient Encounter Medications as of 06/09/2022  Medication Sig   aspirin EC 81 MG tablet Take 81 mg by mouth daily. Swallow whole.   atorvastatin (LIPITOR) 10 MG tablet Take 1 tablet (10 mg total) by mouth daily.   ezetimibe (ZETIA) 10 MG tablet Take 1 tablet (10 mg total) by mouth daily.   levothyroxine (SYNTHROID) 50 MCG tablet Take 1 tablet (50 mcg total) by mouth daily.   omeprazole (PRILOSEC) 40 MG capsule TAKE ONE CAPSULE BY MOUTH IN THE MORNING AND AT BEDTIME   ramipril (ALTACE) 2.5 MG capsule Take 1 capsule (2.5 mg total) by mouth daily.   Zoster Vaccine Adjuvanted [facility]) injection Inject 0.5 mLs into the muscle once.   [DISCONTINUED] glipiZIDE (GLUCOTROL) 5 MG tablet Take 1 tablet (5 mg total) by mouth 2 (two) times daily.   [EXPIRED] cyanocobalamin (VITAMIN B12) injection 1,000 mcg    No facility-administered encounter medications on file as of 06/09/2022.    Surgical History: Past Surgical  History:  Procedure Laterality Date   COLONOSCOPY     COLONOSCOPY WITH PROPOFOL N/A 09/20/2015   Procedure: COLONOSCOPY WITH PROPOFOL;  Surgeon: Lucilla Lame, MD;  Location: [place];  Service: Endoscopy;  Laterality: N/A;   COLONOSCOPY WITH PROPOFOL N/A 07/05/2020   Procedure: COLONOSCOPY WITH PROPOFOL;  Surgeon: Lucilla Lame, MD;  Location: [place];  Service: Endoscopy;  Laterality: N/A;  Diabetic - oral meds   POLYPECTOMY  09/20/2015   Procedure: POLYPECTOMY INTESTINAL;  Surgeon: Lucilla Lame, MD;  Location: [place];  Service: Endoscopy;;  Sigmoid colon polyp x 1 Rectal polyp x 2   POLYPECTOMY  07/05/2020   Procedure: POLYPECTOMY;  Surgeon: Lucilla Lame, MD;  Location: [place];  Service: Endoscopy;;    Medical History: Past Medical History:  Diagnosis Date   Anemia    in past   CAD (coronary artery disease)    Cardiac catheterization done in July 2015 in [place] by Dr. Jori Moll fields showed mild nonobstructive disease in the mid LAD and distal left circumflex with normal ejection fraction.   Diabetes mellitus without complication ([place])    Hypertension    Hypothyroidism    Thyroid disease     Family History: Family History  Family history unknown: Yes      Review of Systems  Constitutional:  Negative for chills, fatigue and unexpected weight change.  HENT:  Negative for congestion, postnasal drip, rhinorrhea, sneezing and sore throat.   Eyes:  Negative for redness.  Respiratory:  Negative for cough, chest tightness and shortness of breath.   Cardiovascular:  Negative for chest pain and palpitations.  Gastrointestinal:  Negative for abdominal pain, constipation, diarrhea, nausea and vomiting.  Genitourinary:  Negative for dysuria and frequency.  Musculoskeletal:  Negative for arthralgias, back pain, joint swelling and neck pain.  Skin:  Negative for rash.  Neurological: Negative.  Negative for tremors and numbness.   Hematological:  Negative for adenopathy. Does not bruise/bleed easily.  Psychiatric/Behavioral:  Negative for behavioral problems (Depression), sleep disturbance and suicidal ideas. The patient is not nervous/anxious.      Vital Signs: BP 132/82 Comment: 150/88  Pulse 60   Temp 98 F (36.7 C)   Resp 16   Ht '5\' 5"'$  (1.651 m)   Wt 169 lb (76.7 kg)   SpO2 98%   BMI 28.12 kg/m    Physical Exam Vitals and nursing note reviewed.  Constitutional:      General: He is not in acute distress.    Appearance: He is well-developed. He is not diaphoretic.  HENT:     Head: Normocephalic and atraumatic.     Mouth/Throat:     Pharynx: No oropharyngeal exudate.  Eyes:     Pupils: Pupils are equal, round, and reactive to light.  Neck:     Thyroid: No thyromegaly.     Vascular: No JVD.     Trachea: No tracheal deviation.  Cardiovascular:     Rate and Rhythm: Normal rate and regular rhythm.     Heart sounds: Normal heart sounds. No murmur heard.    No friction rub. No gallop.  Pulmonary:     Effort: Pulmonary effort is normal. No respiratory distress.     Breath sounds: No wheezing or rales.  Chest:     Chest wall: No tenderness.  Abdominal:     General: Bowel sounds are normal.     Palpations: Abdomen is soft.     Tenderness: There is no abdominal tenderness.  Musculoskeletal:        General: Normal range of motion.     Cervical back: Normal range of motion and neck supple.  Lymphadenopathy:     Cervical: No cervical adenopathy.  Skin:    General: Skin is warm and dry.  Neurological:     Mental Status: He is alert and oriented to person, place, and time.     Cranial Nerves: No cranial nerve deficit.  Psychiatric:        Behavior: Behavior normal.        Thought Content: Thought content normal.        Judgment: Judgment normal.      LABS: Recent Results (from the past 2160 hour(s))  POCT glycosylated hemoglobin (Hb A1C)     Status: Abnormal   Collection Time: 06/09/22   9:10 AM  Result Value Ref Range   Hemoglobin A1C 6.8 (A) 4.0 - 5.6 %   HbA1c POC (<> result, manual entry)     HbA1c, POC (prediabetic range)     HbA1c, POC (controlled diabetic range)          Assessment/Plan: 1. Encounter for general adult medical examination with abnormal findings Cpe performed, labs due, PHM UTD  2. Type 2 diabetes mellitus with hyperglycemia, without long-term current use of insulin (HCC) - POCT glycosylated hemoglobin (Hb A1C) is 6.8 which is improved from 7.5 last visit. Continue current medications and monitoring - Urine Microalbumin w/creat. ratio  3. Essential hypertension Stable, continue  current medication  4. Acquired hypothyroidism Will update labs and adjust as indicated - TSH + free T4  5. B12 deficiency - B12 and Folate Panel - cyanocobalamin (VITAMIN B12) injection 1,000 mcg  6. Other fatigue - TSH + free T4 - Comprehensive metabolic panel - CBC w/Diff/Platelet - B12 and Folate Panel  7. Dysuria - UA/M w/rflx Culture, Routine   General Counseling: Kache verbalizes understanding of the findings of todays visit and agrees with plan of treatment. I have discussed any further diagnostic evaluation that may be needed or ordered today. We also reviewed his medications today. he has been encouraged to call the office with any questions or concerns that should arise related to todays visit.    Counseling:    Orders Placed This Encounter  Procedures   TSH + free T4   Comprehensive metabolic panel   CBC w/Diff/Platelet   B12 and Folate Panel   UA/M w/rflx Culture, Routine   Urine Microalbumin w/creat. ratio   POCT glycosylated hemoglobin (Hb A1C)    Meds ordered this encounter  Medications   cyanocobalamin (VITAMIN B12) injection 1,000 mcg    This patient was seen by Drema Dallas, PA-C in collaboration with Dr. Clayborn Bigness as a part of collaborative care agreement.  Total time spent:35 Minutes  Time spent includes  review of chart, medications, test results, and follow up plan with the patient.     Lavera Guise, MD  Internal Medicine

## 2022-06-10 LAB — UA/M W/RFLX CULTURE, ROUTINE
Bilirubin, UA: NEGATIVE
Ketones, UA: NEGATIVE
Leukocytes,UA: NEGATIVE
Nitrite, UA: NEGATIVE
Protein,UA: NEGATIVE
RBC, UA: NEGATIVE
Specific Gravity, UA: 1.023 (ref 1.005–1.030)
Urobilinogen, Ur: 0.2 mg/dL (ref 0.2–1.0)
pH, UA: 5.5 (ref 5.0–7.5)

## 2022-06-10 LAB — MICROSCOPIC EXAMINATION
Bacteria, UA: NONE SEEN
Casts: NONE SEEN /lpf
Epithelial Cells (non renal): NONE SEEN /hpf (ref 0–10)
WBC, UA: NONE SEEN /hpf (ref 0–5)

## 2022-06-10 LAB — MICROALBUMIN / CREATININE URINE RATIO
Creatinine, Urine: 100.8 mg/dL
Microalb/Creat Ratio: 9 mg/g creat (ref 0–29)
Microalbumin, Urine: 9.1 ug/mL

## 2022-08-25 IMAGING — CT CT NECK W/ CM
4 of 5 series · 14 of 33 positions shown, 16 images · IV contrast (omnipaque)
Comparison: None.

CLINICAL DATA: 69-year-old male with globus sensation, gets food
stuck in throat, acid reflux.

EXAM:
CT NECK WITH CONTRAST
TECHNIQUE: Multidetector CT imaging of the neck was performed using the
standard protocol following the bolus administration of intravenous
contrast.
CONTRAST:  80mL OMNIPAQUE IOHEXOL 300 MG/ML  SOLN

[Series 2: axial neck neck (person_name) 2.00 · axial · 0.60mm/px · z∈[-674,-598]mm · 2 of 116 slices shown]
[im 39/116  bone]
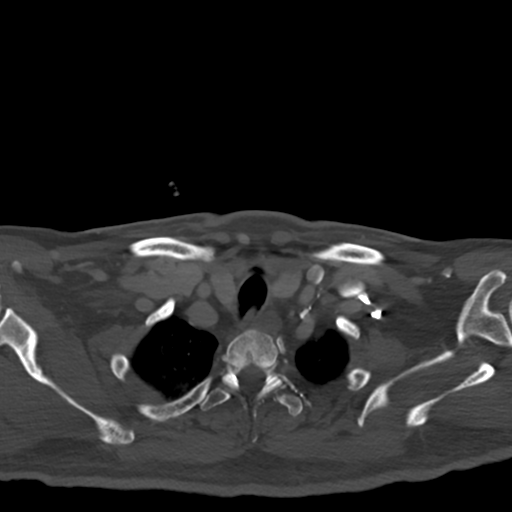
[im 77/116  bone]
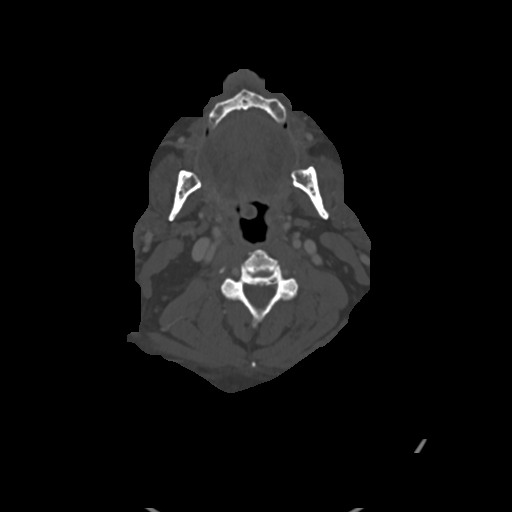

[Series 4: coronal neck neck (person_name) 2.00 cor · coronal · 0.55mm/px · 3 of 143 slices shown]
[im 57/143  bone]
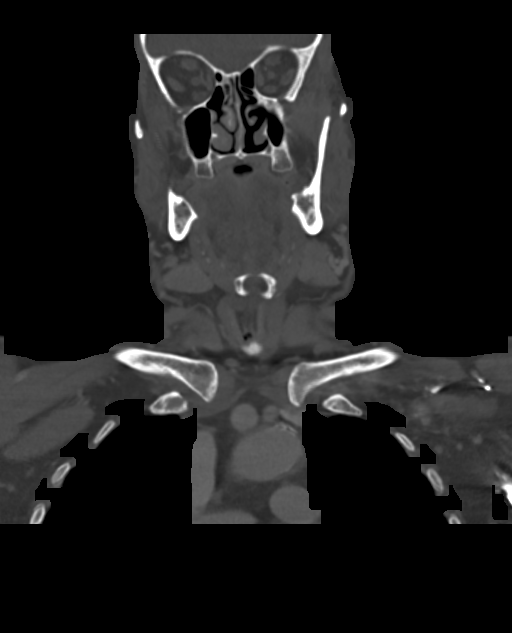
[im 67/143  bone]
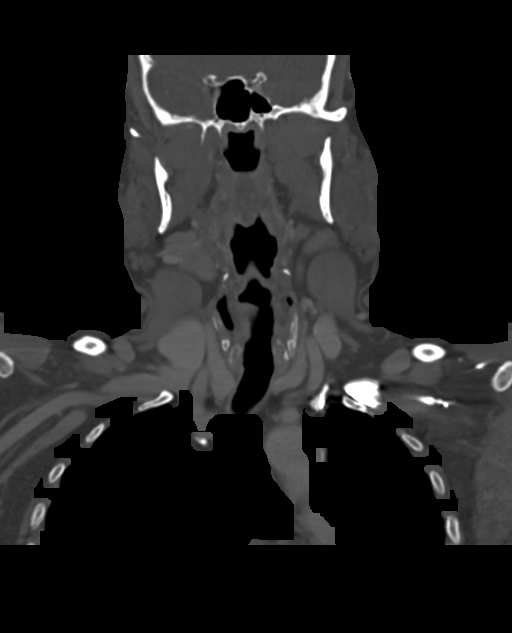
[im 77/143  bone]
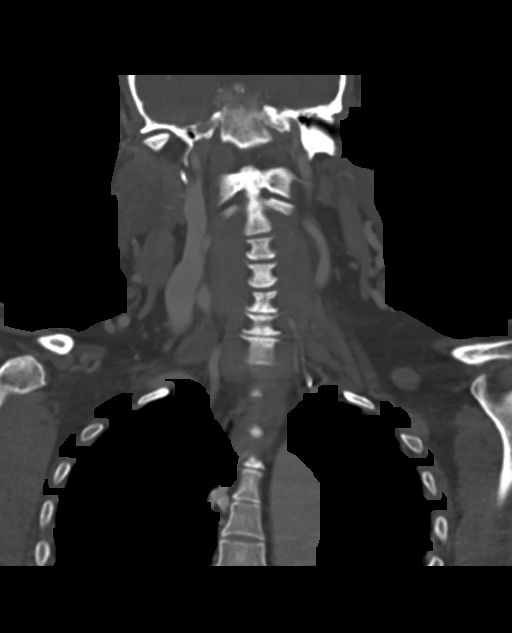

[Series 6: sagittal neck neck (person_name) 2.00 sag · sagittal · 0.56mm/px · 5 of 140 slices shown, 6 images]
[im 47/140  bone]
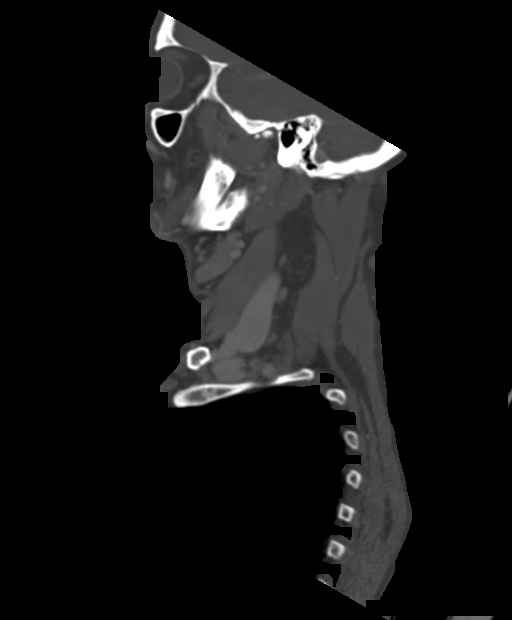
[im 58/140  bone]
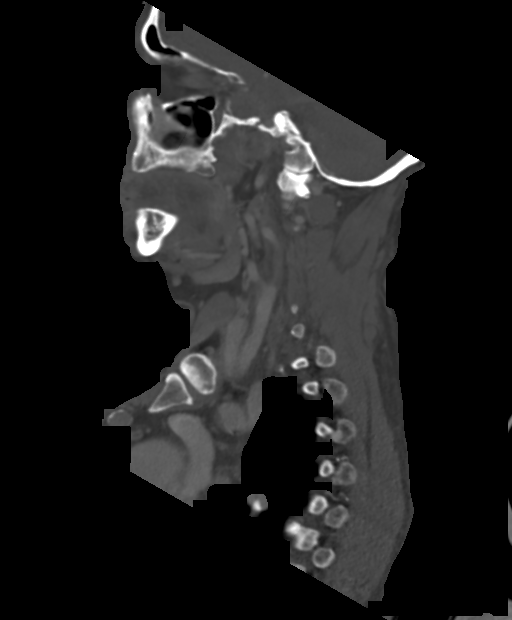
[im 70/140  soft-tissue]
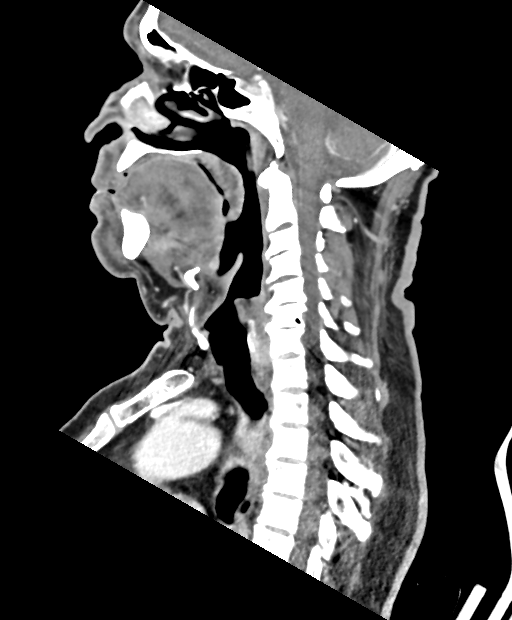
[im 70/140  bone]
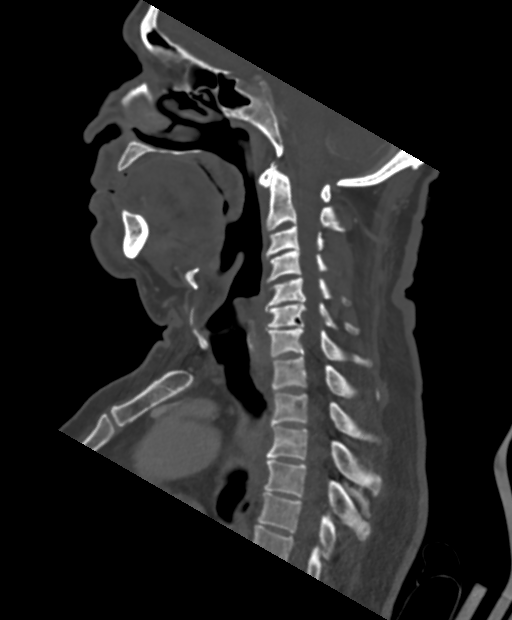
[im 82/140  bone]
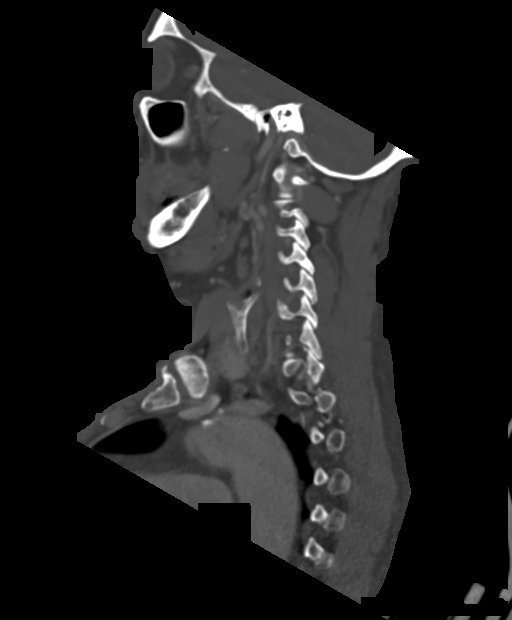
[im 93/140  bone]
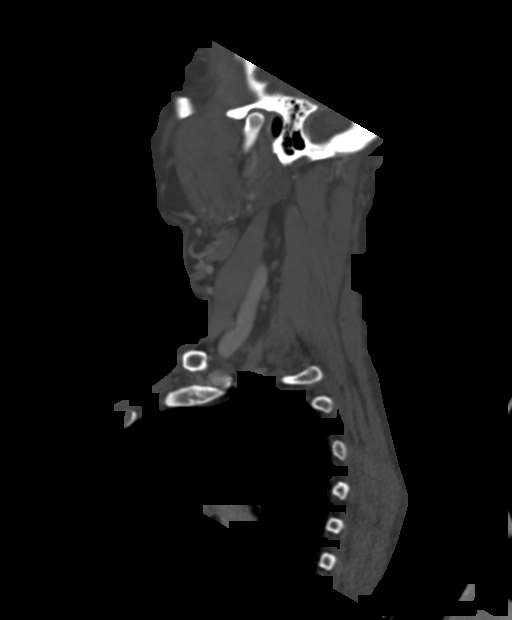

[Series 8: ax oropharynx neck neck (person_name) 2.00 ax · axial · 0.55mm/px · z∈[-800,-619]mm · 4 of 174 slices shown, 5 images]
[im 35/174  soft-tissue]
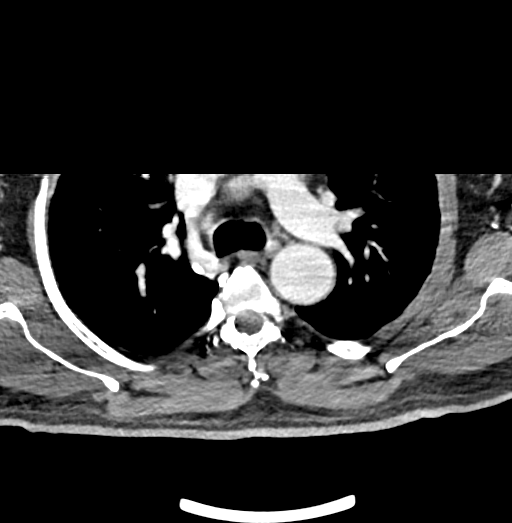
[im 35/174  bone]
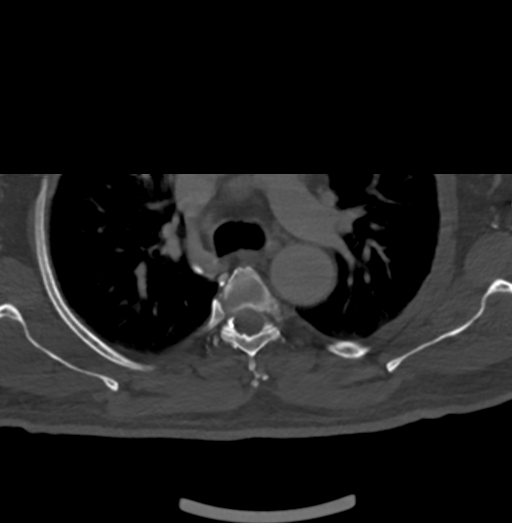
[im 70/174  bone]
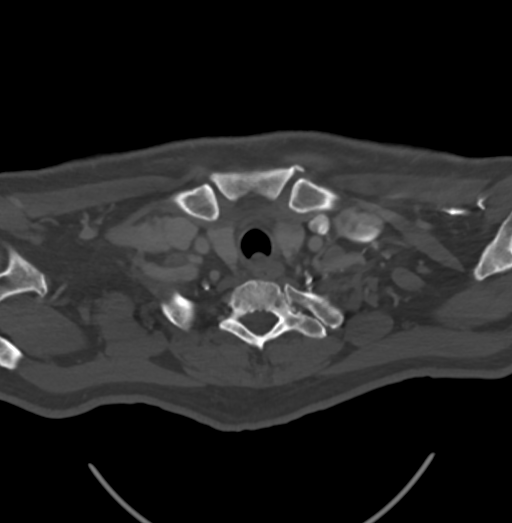
[im 104/174  bone]
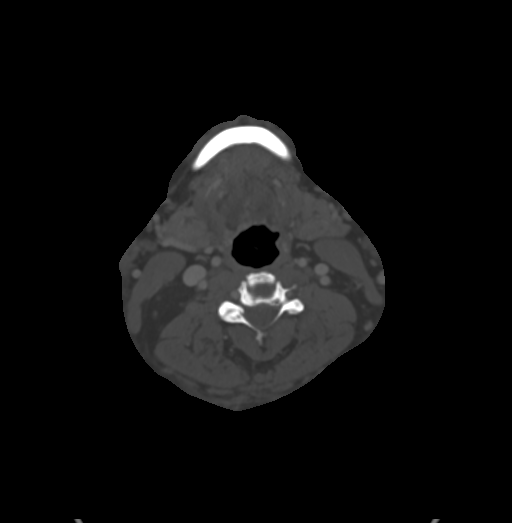
[im 139/174  bone]
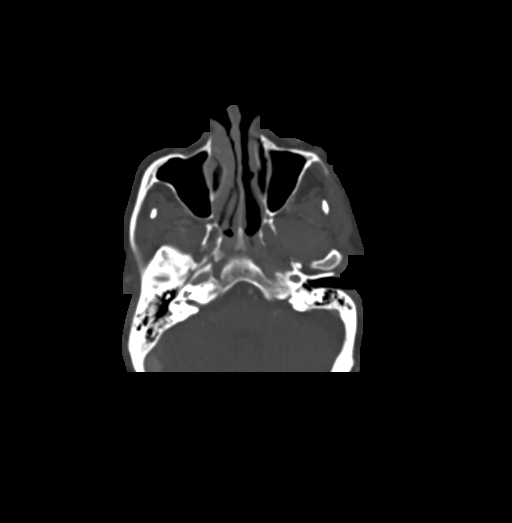

[14 of 33 positions shown; findings below may reference images not displayed]

FINDINGS: Pharynx and larynx: The larynx is asymmetric, with suggestion of
right vocal cord paralysis, but no laryngeal mass or
hyperenhancement is identified.

However, there is a lepidic pattern of asymmetric mucosal and
submucosal enhancement in the oropharynx from the base of tongue and
vallecula (series 2, image 54) along the right glossotonsillar
sulcus and right lateral pharyngeal wall to the level of the soft
palate and uvula (series 2, image 43 and see also coronal series 4,
image 65. The uvula might also be abnormally enhancing (coronal
image 66).

The left lateral pharyngeal wall and nasopharynx appear within
normal limits/symmetric. Size of the soft palate is somewhat
prominent (sagittal image 68), but no discrete soft palate mass is
identified.

The parapharyngeal and retropharyngeal spaces remain within normal
limits.

Salivary glands: Negative sublingual space, submandibular glands,
parotid glands.

Thyroid: Negative.

Lymph nodes: Negative. No cervical lymphadenopathy.

Vascular: Major vascular structures in the neck and at the skull
base remain patent, but there is severe stenosis of the proximal
right ICA from its origin along an 8 mm segment (sagittal image 57)
due to bulky soft plaque. Stenosis there is numerically estimated at
70%. The right vertebral artery appears dominant.

Limited intracranial: Negative.

Visualized orbits: Negative.

Mastoids and visualized paranasal sinuses: Evidence of previous
sinus surgery on the left. Mild mostly maxillary sinus mucosal
thickening. Tympanic cavities and mastoids are clear.

Skeleton: Absent dentition. Chronic right TMJ degeneration. Chronic
cervical spine disc and endplate degeneration. No acute or
suspicious osseous lesion.

Upper chest: Calcified aortic atherosclerosis. No superior
mediastinal lymphadenopathy. The cervical and visible thoracic
esophagus are unremarkable. Negative lung apices.
IMPRESSION: 1. Asymmetric mucosal and submucosal hyperenhancement along the
right anterolateral wall of the pharynx from the tongue base to the
soft palate, possibly also involving the uvula. See coronal image
65.
Although nonspecific the appearance is suspicious for carcinoma with
a lepidic growth pattern. A asymmetric mucositis or other
inflammatory etiology is felt less likely. However, there is no
associated cervical lymphadenopathy. Recommend direct inspection.

2. Asymmetry of the larynx suggesting right vocal cord paralysis,
but no laryngeal mass or causative lesion is identified.

3. Atherosclerosis with estimated 70% stenosis of the proximal right
ICA due to low-density soft plaque. Aortic Atherosclerosis
([id number]).

## 2022-08-26 ENCOUNTER — Other Ambulatory Visit
Admission: RE | Admit: 2022-08-26 | Discharge: 2022-08-26 | Disposition: A | Discharge status: Home / Self Care | Payer: Medicare Other | Attending: Physician Assistant | Admitting: Physician Assistant

## 2022-08-26 DIAGNOSIS — E538 Deficiency of other specified B group vitamins: Secondary | ICD-10-CM | POA: Insufficient documentation

## 2022-08-26 DIAGNOSIS — R5383 Other fatigue: Secondary | ICD-10-CM | POA: Diagnosis not present

## 2022-08-26 DIAGNOSIS — E039 Hypothyroidism, unspecified: Secondary | ICD-10-CM | POA: Diagnosis not present

## 2022-08-26 LAB — COMPREHENSIVE METABOLIC PANEL
ALT: 28 U/L (ref 0–44)
AST: 29 U/L (ref 15–41)
Albumin: 4 g/dL (ref 3.5–5.0)
Alkaline Phosphatase: 87 U/L (ref 38–126)
Anion gap: 4 — ABNORMAL LOW (ref 5–15)
BUN: 12 mg/dL (ref 8–23)
CO2: 26 mmol/L (ref 22–32)
Calcium: 8.9 mg/dL (ref 8.9–10.3)
Chloride: 105 mmol/L (ref 98–111)
Creatinine, Ser: 0.84 mg/dL (ref 0.61–1.24)
GFR, Estimated: >60 mL/min (ref >60)
Glucose, Bld: 87 mg/dL (ref 70–99)
Potassium: 4.1 mmol/L (ref 3.5–5.1)
Sodium: 135 mmol/L (ref 135–145)
Total Bilirubin: 1 mg/dL (ref 0.3–1.2)
Total Protein: 6.9 g/dL (ref 6.5–8.1)

## 2022-08-26 LAB — CBC WITH DIFFERENTIAL/PLATELET
Abs Immature Granulocytes: 0.02 10*3/uL (ref 0.00–0.07)
Basophils Absolute: 0.1 10*3/uL (ref 0.0–0.1)
Basophils Relative: 1 %
Eosinophils Absolute: 0.2 10*3/uL (ref 0.0–0.5)
Eosinophils Relative: 4 %
HCT: 44.9 % (ref 39.0–52.0)
Hemoglobin: 14.5 g/dL (ref 13.0–17.0)
Immature Granulocytes: 0 %
Lymphocytes Relative: 17 %
Lymphs Abs: 1 10*3/uL (ref 0.7–4.0)
MCH: 27.8 pg (ref 26.0–34.0)
MCHC: 32.3 g/dL (ref 30.0–36.0)
MCV: 86.2 fL (ref 80.0–100.0)
Monocytes Absolute: 0.7 10*3/uL (ref 0.1–1.0)
Monocytes Relative: 11 %
Neutro Abs: 4 10*3/uL (ref 1.7–7.7)
Neutrophils Relative %: 67 %
Platelets: 164 10*3/uL (ref 150–400)
RBC: 5.21 MIL/uL (ref 4.22–5.81)
RDW: 12.5 % (ref 11.5–15.5)
WBC: 6 10*3/uL (ref 4.0–10.5)
nRBC: 0 % (ref 0.0–0.2)

## 2022-08-26 LAB — FOLATE: Folate: 23 ng/mL (ref >5.9)

## 2022-08-26 LAB — VITAMIN B12: Vitamin B-12: 139 pg/mL — ABNORMAL LOW (ref 180–914)

## 2022-08-26 LAB — TSH: TSH: 4.563 u[IU]/mL — ABNORMAL HIGH (ref 0.350–4.500)

## 2022-08-26 LAB — T4, FREE: Free T4: 0.92 ng/dL (ref 0.61–1.12)

## 2022-09-11 ENCOUNTER — Other Ambulatory Visit: Payer: Self-pay | Admitting: Nurse Practitioner

## 2022-09-11 ENCOUNTER — Encounter: Payer: Self-pay | Admitting: Physician Assistant

## 2022-09-11 ENCOUNTER — Ambulatory Visit (INDEPENDENT_AMBULATORY_CARE_PROVIDER_SITE_OTHER): Payer: Medicare Other | Admitting: Physician Assistant

## 2022-09-11 VITALS — BP 132/78 | HR 62 | Temp 97.8°F | Resp 16 | Ht 65.0 in | Wt 170.6 lb

## 2022-09-11 DIAGNOSIS — E782 Mixed hyperlipidemia: Secondary | ICD-10-CM

## 2022-09-11 DIAGNOSIS — E039 Hypothyroidism, unspecified: Secondary | ICD-10-CM | POA: Diagnosis not present

## 2022-09-11 DIAGNOSIS — E538 Deficiency of other specified B group vitamins: Secondary | ICD-10-CM

## 2022-09-11 DIAGNOSIS — I10 Essential (primary) hypertension: Secondary | ICD-10-CM

## 2022-09-11 DIAGNOSIS — J301 Allergic rhinitis due to pollen: Secondary | ICD-10-CM | POA: Diagnosis not present

## 2022-09-11 DIAGNOSIS — E1165 Type 2 diabetes mellitus with hyperglycemia: Secondary | ICD-10-CM

## 2022-09-11 LAB — POCT GLYCOSYLATED HEMOGLOBIN (HGB A1C): Hemoglobin A1C: 7.5 % — AB (ref 4.0–5.6)

## 2022-09-11 MED ORDER — CYANOCOBALAMIN 1000 MCG/ML IJ SOLN
1000.0000 ug | Freq: Once | INTRAMUSCULAR | Status: AC
  Administered 2022-09-11: 1000 ug via INTRAMUSCULAR

## 2022-09-11 NOTE — Progress Notes (Signed)
[facility] [place], [place] [address]  Internal MEDICINE  Office Visit Note  Patient Name: Travis Higgins  [id number]  [id number]  Date of Service: 09/11/2022  Chief Complaint  Patient presents with   Follow-up    Having some allergies, causing sore throat and coughing at night. Takes OTC medicines such as Benadryl.   Diabetes   Hypertension    HPI Pt is here for routine follow up -Some sore throat, some postnasal drip, and reports allergies have been bothering him. Discussed starting daily antihistamine such as zyrtec or allegra for while weather is changing and may try nasal spray. -labs reviewed and look good except for low B12. Tsh still borderline high but t4 normal and will continue. Unfortunately the lipid panel ordered by Dr. Fletcher Anon from cardiology, was not done with his labs. Will reordered this and will have this done while fasting. -Will need to start exercising again, which he reports he does better in spring/summer and can be outside in nicer weather. His diet has not changed, yet A1c is rising and he states this is the biggest difference for him. He did look into cost of other diabetic meds such as farxiga and states these are very expensive and wants to stick with glipizide and adjust exercise for now -due for foot exam for new diabetic shoes next visit  Current Medication: Outpatient Encounter Medications as of 09/11/2022  Medication Sig   aspirin EC 81 MG tablet Take 81 mg by mouth daily. Swallow whole.   atorvastatin (LIPITOR) 10 MG tablet Take 1 tablet (10 mg total) by mouth daily.   ezetimibe (ZETIA) 10 MG tablet Take 1 tablet (10 mg total) by mouth daily.   glipiZIDE (GLUCOTROL) 5 MG tablet Take 1 tablet by mouth twice daily   levothyroxine (SYNTHROID) 50 MCG tablet Take 1 tablet (50 mcg total) by mouth daily.   omeprazole (PRILOSEC) 40 MG capsule TAKE ONE CAPSULE BY MOUTH IN THE MORNING AND AT BEDTIME   ramipril (ALTACE) 2.5 MG capsule  Take 1 capsule by mouth once daily   Zoster Vaccine Adjuvanted [facility]) injection Inject 0.5 mLs into the muscle once.   [EXPIRED] cyanocobalamin (VITAMIN B12) injection 1,000 mcg    No facility-administered encounter medications on file as of 09/11/2022.    Surgical History: Past Surgical History:  Procedure Laterality Date   COLONOSCOPY     COLONOSCOPY WITH PROPOFOL N/A 09/20/2015   Procedure: COLONOSCOPY WITH PROPOFOL;  Surgeon: Lucilla Lame, MD;  Location: [place];  Service: Endoscopy;  Laterality: N/A;   COLONOSCOPY WITH PROPOFOL N/A 07/05/2020   Procedure: COLONOSCOPY WITH PROPOFOL;  Surgeon: Lucilla Lame, MD;  Location: [place];  Service: Endoscopy;  Laterality: N/A;  Diabetic - oral meds   POLYPECTOMY  09/20/2015   Procedure: POLYPECTOMY INTESTINAL;  Surgeon: Lucilla Lame, MD;  Location: [place];  Service: Endoscopy;;  Sigmoid colon polyp x 1 Rectal polyp x 2   POLYPECTOMY  07/05/2020   Procedure: POLYPECTOMY;  Surgeon: Lucilla Lame, MD;  Location: [place];  Service: Endoscopy;;    Medical History: Past Medical History:  Diagnosis Date   Anemia    in past   CAD (coronary artery disease)    Cardiac catheterization done in July 2015 in [place] by Dr. Jori Moll fields showed mild nonobstructive disease in the mid LAD and distal left circumflex with normal ejection fraction.   Diabetes mellitus without complication    Hypertension    Hypothyroidism    Thyroid disease  Family History: Family History  Family history unknown: Yes    Social History   Socioeconomic History   Marital status: Married    Spouse name: Not on file   Number of children: Not on file   Years of education: Not on file   Highest education level: Not on file  Occupational History   Not on file  Tobacco Use   Smoking status: Former   Smokeless tobacco: Never   Tobacco comments:    quit approx 2013  Vaping Use   Vaping Use: Never used   Substance and Sexual Activity   Alcohol use: No   Drug use: No   Sexual activity: Not on file  Other Topics Concern   Not on file  Social History Narrative   Not on file   Social Determinants of Health   Financial Resource Strain: Not on file  Food Insecurity: Not on file  Transportation Needs: Not on file  Physical Activity: Not on file  Stress: Not on file  Social Connections: Not on file  Intimate Partner Violence: Not on file      Review of Systems  Constitutional:  Negative for chills, fatigue and unexpected weight change.  HENT:  Positive for postnasal drip and sore throat. Negative for congestion, rhinorrhea and sneezing.   Eyes:  Negative for redness.  Respiratory:  Positive for cough. Negative for chest tightness and shortness of breath.   Cardiovascular:  Negative for chest pain and palpitations.  Gastrointestinal:  Negative for abdominal pain, constipation, diarrhea, nausea and vomiting.  Genitourinary:  Negative for dysuria and frequency.  Musculoskeletal:  Negative for arthralgias, back pain, joint swelling and neck pain.  Skin:  Negative for rash.  Neurological: Negative.  Negative for tremors and numbness.  Hematological:  Negative for adenopathy. Does not bruise/bleed easily.  Psychiatric/Behavioral:  Negative for behavioral problems (Depression), sleep disturbance and suicidal ideas. The patient is not nervous/anxious.     Vital Signs: BP 132/78 Comment: 142/80  Pulse 62   Temp 97.8 F (36.6 C)   Resp 16   Ht 5\' 5"  (1.651 m)   Wt 170 lb 9.6 oz (77.4 kg)   SpO2 97%   BMI 28.39 kg/m    Physical Exam Vitals and nursing note reviewed.  Constitutional:      General: He is not in acute distress.    Appearance: He is well-developed. He is not diaphoretic.  HENT:     Head: Normocephalic and atraumatic.     Mouth/Throat:     Pharynx: No oropharyngeal exudate.  Eyes:     Pupils: Pupils are equal, round, and reactive to light.  Neck:     Thyroid:  No thyromegaly.     Vascular: No JVD.     Trachea: No tracheal deviation.  Cardiovascular:     Rate and Rhythm: Normal rate and regular rhythm.     Heart sounds: Normal heart sounds. No murmur heard.    No friction rub. No gallop.  Pulmonary:     Effort: Pulmonary effort is normal. No respiratory distress.     Breath sounds: No wheezing or rales.  Chest:     Chest wall: No tenderness.  Abdominal:     General: Bowel sounds are normal.     Palpations: Abdomen is soft.  Musculoskeletal:        General: Normal range of motion.     Cervical back: Normal range of motion and neck supple.  Lymphadenopathy:     Cervical: No cervical adenopathy.  Skin:    General: Skin is warm and dry.  Neurological:     Mental Status: He is alert and oriented to person, place, and time.     Cranial Nerves: No cranial nerve deficit.  Psychiatric:        Behavior: Behavior normal.        Thought Content: Thought content normal.        Judgment: Judgment normal.        Assessment/Plan: 1. Type 2 diabetes mellitus with hyperglycemia, without long-term current use of insulin - POCT HgB A1C is 7.5 which is up from 6.8 last visit. Will continue current medication and will really focus on increasing exercise and improving diet  2. Essential hypertension Stable, continue current medication  3. Mixed hyperlipidemia Will update labs - Lipid Panel With LDL/HDL Ratio  4. B12 deficiency - cyanocobalamin (VITAMIN B12) injection 1,000 mcg  5. Seasonal allergic rhinitis due to pollen May try antihistamine and nasal spray  6. Acquired hypothyroidism Continue synthroid as before   General Counseling: Naylor verbalizes understanding of the findings of todays visit and agrees with plan of treatment. I have discussed any further diagnostic evaluation that may be needed or ordered today. We also reviewed his medications today. he has been encouraged to call the office with any questions or concerns that  should arise related to todays visit.    Orders Placed This Encounter  Procedures   Lipid Panel With LDL/HDL Ratio   POCT HgB A1C    Meds ordered this encounter  Medications   cyanocobalamin (VITAMIN B12) injection 1,000 mcg    This patient was seen by Drema Dallas, PA-C in collaboration with Dr. Clayborn Bigness as a part of collaborative care agreement.   Total time spent:30 Minutes Time spent includes review of chart, medications, test results, and follow up plan with the patient.      Dr Lavera Guise Internal medicine

## 2022-12-08 ENCOUNTER — Other Ambulatory Visit: Payer: Self-pay | Admitting: Physician Assistant

## 2022-12-08 ENCOUNTER — Other Ambulatory Visit: Payer: Self-pay | Admitting: Nurse Practitioner

## 2022-12-08 DIAGNOSIS — E1165 Type 2 diabetes mellitus with hyperglycemia: Secondary | ICD-10-CM

## 2022-12-08 DIAGNOSIS — I10 Essential (primary) hypertension: Secondary | ICD-10-CM

## 2023-01-11 ENCOUNTER — Other Ambulatory Visit
Admission: RE | Admit: 2023-01-11 | Discharge: 2023-01-11 | Disposition: A | Discharge status: Home / Self Care | Payer: Medicare Other | Attending: Physician Assistant | Admitting: Physician Assistant

## 2023-01-11 DIAGNOSIS — E782 Mixed hyperlipidemia: Secondary | ICD-10-CM | POA: Insufficient documentation

## 2023-01-11 LAB — LIPID PANEL
Cholesterol: 102 mg/dL (ref 0–200)
HDL: 34 mg/dL — ABNORMAL LOW (ref >40)
LDL Cholesterol: 54 mg/dL (ref 0–99)
Total CHOL/HDL Ratio: 3 RATIO
Triglycerides: 70 mg/dL (ref <150)
VLDL: 14 mg/dL (ref 0–40)

## 2023-01-15 ENCOUNTER — Ambulatory Visit: Payer: Medicare Other | Admitting: Physician Assistant

## 2023-01-22 ENCOUNTER — Encounter: Payer: Self-pay | Admitting: Physician Assistant

## 2023-01-22 ENCOUNTER — Ambulatory Visit (INDEPENDENT_AMBULATORY_CARE_PROVIDER_SITE_OTHER): Payer: Medicare Other | Admitting: Physician Assistant

## 2023-01-22 VITALS — BP 118/80 | HR 65 | Temp 98.5°F | Resp 16 | Ht 65.0 in | Wt 169.4 lb

## 2023-01-22 DIAGNOSIS — E782 Mixed hyperlipidemia: Secondary | ICD-10-CM | POA: Diagnosis not present

## 2023-01-22 DIAGNOSIS — E039 Hypothyroidism, unspecified: Secondary | ICD-10-CM | POA: Diagnosis not present

## 2023-01-22 DIAGNOSIS — E1165 Type 2 diabetes mellitus with hyperglycemia: Secondary | ICD-10-CM | POA: Diagnosis not present

## 2023-01-22 DIAGNOSIS — I10 Essential (primary) hypertension: Secondary | ICD-10-CM

## 2023-01-22 LAB — POCT GLYCOSYLATED HEMOGLOBIN (HGB A1C): Hemoglobin A1C: 7.4 % — AB (ref 4.0–5.6)

## 2023-01-22 NOTE — Progress Notes (Signed)
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 -cholesterol greatly improved on recheck -has form for foot exam, done today -Does have bunion on right foot and has some burning. Callous and dry skin present  Current Medication: Outpatient Encounter Medications as of 01/22/2023  Medication Sig   aspirin EC 81 MG tablet Take 81 mg by mouth daily. Swallow whole.   atorvastatin (LIPITOR) 10 MG tablet Take 1 tablet (10 mg total) by mouth daily.   ezetimibe (ZETIA) 10 MG tablet Take 1 tablet (10 mg total) by mouth daily.   glipiZIDE (GLUCOTROL) 5 MG tablet Take 1 tablet by mouth twice daily   levothyroxine (SYNTHROID) 50 MCG tablet Take 1 tablet (50 mcg total) by mouth daily.   omeprazole (PRILOSEC) 40 MG capsule TAKE ONE CAPSULE BY MOUTH IN THE MORNING AND AT BEDTIME   ramipril (ALTACE) 2.5 MG capsule Take 1 capsule by mouth once daily   Zoster Vaccine Adjuvanted [facility]) injection Inject 0.5 mLs into the muscle once.   No facility-administered encounter medications on file as of 01/22/2023.    Surgical History: Past Surgical History:  Procedure Laterality Date   COLONOSCOPY     COLONOSCOPY WITH PROPOFOL N/A 09/20/2015   Procedure: COLONOSCOPY WITH PROPOFOL;  Surgeon: Midge Minium, MD;  Location: [facility];  Service: Endoscopy;  Laterality: N/A;   COLONOSCOPY WITH PROPOFOL N/A 07/05/2020   Procedure: COLONOSCOPY WITH PROPOFOL;  Surgeon: Midge Minium, MD;  Location: [facility];  Service: Endoscopy;  Laterality: N/A;  Diabetic - oral meds   POLYPECTOMY   09/20/2015   Procedure: POLYPECTOMY INTESTINAL;  Surgeon: Midge Minium, MD;  Location: [facility];  Service: Endoscopy;;  Sigmoid colon polyp x 1 Rectal polyp x 2   POLYPECTOMY  07/05/2020   Procedure: POLYPECTOMY;  Surgeon: Midge Minium, MD;  Location: [facility];  Service: Endoscopy;;    Medical History: Past Medical History:  Diagnosis Date   Anemia    in past   CAD (coronary artery disease)    Cardiac catheterization done in July 2015 in [place] by Dr. Windy Fast fields showed mild nonobstructive disease in the mid LAD and distal left circumflex with normal ejection fraction.   Diabetes mellitus without complication (HCC)    Hypertension    Hypothyroidism    Thyroid disease     Family History: Family History  Family history unknown: Yes    Social History   Socioeconomic History   Marital status: Married    Spouse name: Not on file   Number of children: Not on file   Years of education: Not on file   Highest education level: Not on file  Occupational History   Not on file  Tobacco Use   Smoking status: Former   Smokeless tobacco: Never   Tobacco comments:    quit approx 2013  Vaping Use   Vaping status: Never Used  Substance and Sexual Activity   Alcohol use: No   Drug use: No   Sexual activity: Not on file  Other Topics Concern   Not on file  Social History Narrative   Not on file   Social Determinants of Health   Financial Resource Strain: Not on file  Food Insecurity: Not on file  Transportation Needs: Not on file  Physical Activity: Not on file  Stress: Not on file  Social Connections: Not on file  Intimate Partner Violence: Not on file      Review of Systems  Constitutional:  Negative for chills, fatigue and unexpected weight change.  HENT:  Negative for congestion, postnasal drip, rhinorrhea, sneezing and sore throat.   Eyes:  Negative for redness.  Respiratory:  Negative for cough, chest tightness and shortness of breath.    Cardiovascular:  Negative for chest pain and palpitations.  Gastrointestinal:  Negative for abdominal pain, constipation, diarrhea, nausea and vomiting.  Genitourinary:  Negative for dysuria and frequency.  Musculoskeletal:  Negative for arthralgias, back pain, joint swelling and neck pain.  Skin:  Negative for rash.  Neurological: Negative.  Negative for tremors and numbness.  Hematological:  Negative for adenopathy. Does not bruise/bleed easily.  Psychiatric/Behavioral:  Negative for behavioral problems (Depression), sleep disturbance and suicidal ideas. The patient is not nervous/anxious.     Vital Signs: BP 118/80   Pulse 65   Temp 98.5 F (36.9 C)   Resp 16   Ht 5\' 5"  (1.651 m)   Wt 169 lb 6.4 oz (76.8 kg)   SpO2 97%   BMI 28.19 kg/m    Physical Exam Vitals and nursing note reviewed.  Constitutional:      General: He is not in acute distress.    Appearance: He is well-developed. He is not diaphoretic.  HENT:     Head: Normocephalic and atraumatic.     Mouth/Throat:     Pharynx: No oropharyngeal exudate.  Eyes:     Pupils: Pupils are equal, round, and reactive to light.  Neck:     Thyroid: No thyromegaly.     Vascular: No JVD.     Trachea: No tracheal deviation.  Cardiovascular:     Rate and Rhythm: Normal rate and regular rhythm.     Heart sounds: Normal heart sounds. No murmur heard.    No friction rub. No gallop.  Pulmonary:     Effort: Pulmonary effort is normal. No respiratory distress.     Breath sounds: No wheezing or rales.  Chest:     Chest wall: No tenderness.  Abdominal:     General: Bowel sounds are normal.     Palpations: Abdomen is soft.  Musculoskeletal:        General: Normal range of motion.     Cervical back: Normal range of motion and neck supple.     Right foot: Bunion present.  Feet:     Right foot:     Protective Sensation: 2 sites tested.  2 sites sensed.     Skin integrity: Callus and dry skin present.     Toenail Condition:  Right toenails are abnormally thick.     Left foot:     Protective Sensation: 2 sites tested.  2 sites sensed.     Skin integrity: Callus and dry skin present.     Toenail Condition: Left toenails are abnormally thick.  Lymphadenopathy:     Cervical: No cervical adenopathy.  Skin:    General: Skin is warm and dry.  Neurological:     Mental Status: He is alert and oriented to person, place, and time.     Cranial Nerves: No cranial nerve deficit.  Psychiatric:  Behavior: Behavior normal.        Thought Content: Thought content normal.        Judgment: Judgment normal.        Assessment/Plan: 1. Type 2 diabetes mellitus with hyperglycemia, without long-term current use of insulin (HCC) - POCT HgB A1C is 7.4 which is slightly improved from 7.5 last visit. Continue to work on diet and exercise. Pt declines adding more medication due to cost concerns and would like to keep working on diet. Diabetic shoe order form completed as patient does have some polyneuropathy with callous formation and bunion on right foot.  2. Essential hypertension Well controlled, continue current medication  3. Acquired hypothyroidism Continue current medication  4. Mixed hyperlipidemia Greatly improved, continue current medication   General Counseling: Trindon verbalizes understanding of the findings of todays visit and agrees with plan of treatment. I have discussed any further diagnostic evaluation that may be needed or ordered today. We also reviewed his medications today. he has been encouraged to call the office with any questions or concerns that should arise related to todays visit.    Orders Placed This Encounter  Procedures   POCT HgB A1C    No orders of the defined types were placed in this encounter.   This patient was seen by Lynn Ito, PA-C in collaboration with Dr. Beverely Risen as a part of collaborative care agreement.   Total time spent:30 Minutes Time spent  includes review of chart, medications, test results, and follow up plan with the patient.      Dr Lyndon Code Internal medicine

## 2023-01-26 ENCOUNTER — Telehealth: Payer: Self-pay

## 2023-01-26 NOTE — Telephone Encounter (Signed)
Paper was fax to [employer].

## 2023-03-08 ENCOUNTER — Other Ambulatory Visit: Payer: Self-pay | Admitting: Physician Assistant

## 2023-03-08 DIAGNOSIS — I10 Essential (primary) hypertension: Secondary | ICD-10-CM

## 2023-03-08 DIAGNOSIS — E1165 Type 2 diabetes mellitus with hyperglycemia: Secondary | ICD-10-CM

## 2023-03-22 ENCOUNTER — Other Ambulatory Visit: Payer: Self-pay | Admitting: Nurse Practitioner

## 2023-03-22 DIAGNOSIS — E039 Hypothyroidism, unspecified: Secondary | ICD-10-CM

## 2023-04-07 DIAGNOSIS — Z23 Encounter for immunization: Secondary | ICD-10-CM | POA: Diagnosis not present

## 2023-04-23 ENCOUNTER — Encounter: Payer: Self-pay | Admitting: Physician Assistant

## 2023-04-23 ENCOUNTER — Ambulatory Visit: Payer: Medicare Other | Admitting: Physician Assistant

## 2023-04-23 ENCOUNTER — Ambulatory Visit: Payer: Medicare Other | Attending: Medical | Admitting: Medical

## 2023-04-23 ENCOUNTER — Encounter: Payer: Self-pay | Admitting: Medical

## 2023-04-23 VITALS — BP 142/68 | HR 53 | Ht 65.0 in | Wt 169.0 lb

## 2023-04-23 VITALS — BP 128/80 | HR 77 | Temp 97.8°F | Resp 16 | Ht 65.0 in | Wt 169.2 lb

## 2023-04-23 DIAGNOSIS — E1165 Type 2 diabetes mellitus with hyperglycemia: Secondary | ICD-10-CM | POA: Diagnosis not present

## 2023-04-23 DIAGNOSIS — E782 Mixed hyperlipidemia: Secondary | ICD-10-CM | POA: Diagnosis not present

## 2023-04-23 DIAGNOSIS — R5383 Other fatigue: Secondary | ICD-10-CM

## 2023-04-23 DIAGNOSIS — E538 Deficiency of other specified B group vitamins: Secondary | ICD-10-CM | POA: Diagnosis not present

## 2023-04-23 DIAGNOSIS — E039 Hypothyroidism, unspecified: Secondary | ICD-10-CM

## 2023-04-23 DIAGNOSIS — I2510 Atherosclerotic heart disease of native coronary artery without angina pectoris: Secondary | ICD-10-CM | POA: Insufficient documentation

## 2023-04-23 DIAGNOSIS — E118 Type 2 diabetes mellitus with unspecified complications: Secondary | ICD-10-CM | POA: Diagnosis present

## 2023-04-23 DIAGNOSIS — I10 Essential (primary) hypertension: Secondary | ICD-10-CM | POA: Diagnosis not present

## 2023-04-23 LAB — POCT GLYCOSYLATED HEMOGLOBIN (HGB A1C): Hemoglobin A1C: 8.1 % — AB (ref 4.0–5.6)

## 2023-04-23 MED ORDER — CYANOCOBALAMIN 1000 MCG/ML IJ SOLN
1000.0000 ug | Freq: Once | INTRAMUSCULAR | Status: AC
  Administered 2023-04-23: 1000 ug via INTRAMUSCULAR

## 2023-04-23 NOTE — Progress Notes (Signed)
[facility] [address] [place], [place] [address]  Internal MEDICINE  Office Visit Note  Patient Name: Travis Higgins  [id number]  [id number]  Date of Service: 05/02/2023  Chief Complaint  Patient presents with   Diabetes   Hypertension   Follow-up   Quality Metric Gaps    Diabetic eye exam     HPI Pt is here for routine follow up -still only taking glipizide BID -Pt refuses to increase/add medication -Going to [place] on Sunday and will be gone 3 months, last time he traveled his numbers did get better and would really like to recheck after he returns to compare -Does a lot for walking and less eating when there -will have eye exam in [place] and will bring records -will have labs prior to CPE  Current Medication: Outpatient Encounter Medications as of 04/23/2023  Medication Sig Note   aspirin EC 81 MG tablet Take 81 mg by mouth daily. Swallow whole.    atorvastatin (LIPITOR) 10 MG tablet Take 1 tablet (10 mg total) by mouth daily.    glipiZIDE (GLUCOTROL) 5 MG tablet Take 1 tablet by mouth twice daily    levothyroxine (SYNTHROID) 50 MCG tablet Take 1 tablet by mouth once daily    omeprazole (PRILOSEC) 40 MG capsule TAKE ONE CAPSULE BY MOUTH IN THE MORNING AND AT BEDTIME    ramipril (ALTACE) 2.5 MG capsule Take 1 capsule by mouth once daily    Zoster Vaccine Adjuvanted [facility]) injection Inject 0.5 mLs into the muscle once. (Patient not taking: Reported on 04/23/2023)    [DISCONTINUED] ezetimibe (ZETIA) 10 MG tablet Take 1 tablet (10 mg total) by mouth daily. (Patient not taking: Reported on 04/23/2023) 04/23/2023: pt stopped   [EXPIRED] cyanocobalamin (VITAMIN B12) injection 1,000 mcg     No facility-administered encounter medications on file as of 04/23/2023.    Surgical History: Past Surgical History:  Procedure Laterality Date   COLONOSCOPY     COLONOSCOPY WITH PROPOFOL N/A 09/20/2015   Procedure: COLONOSCOPY WITH PROPOFOL;  Surgeon: Midge Minium, MD;   Location: [facility];  Service: Endoscopy;  Laterality: N/A;   COLONOSCOPY WITH PROPOFOL N/A 07/05/2020   Procedure: COLONOSCOPY WITH PROPOFOL;  Surgeon: Midge Minium, MD;  Location: [facility];  Service: Endoscopy;  Laterality: N/A;  Diabetic - oral meds   POLYPECTOMY  09/20/2015   Procedure: POLYPECTOMY INTESTINAL;  Surgeon: Midge Minium, MD;  Location: [facility];  Service: Endoscopy;;  Sigmoid colon polyp x 1 Rectal polyp x 2   POLYPECTOMY  07/05/2020   Procedure: POLYPECTOMY;  Surgeon: Midge Minium, MD;  Location: [facility];  Service: Endoscopy;;    Medical History: Past Medical History:  Diagnosis Date   Anemia    in past   CAD (coronary artery disease)    Cardiac catheterization done in July 2015 in [place] by Dr. Windy Fast fields showed mild nonobstructive disease in the mid LAD and distal left circumflex with normal ejection fraction.   Diabetes mellitus without complication (HCC)    Hypertension    Hypothyroidism    Thyroid disease     Family History: Family History  Family history unknown: Yes    Social History   Socioeconomic History   Marital status: Married    Spouse name: Not on file   Number of children: Not on file   Years of education: Not on file   Highest education level: Not on file  Occupational History   Not on file  Tobacco Use  Smoking status: Former   Smokeless tobacco: Never   Tobacco comments:    quit approx 2013  Vaping Use   Vaping status: Never Used  Substance and Sexual Activity   Alcohol use: No   Drug use: No   Sexual activity: Not on file  Other Topics Concern   Not on file  Social History Narrative   Not on file   Social Determinants of Health   Financial Resource Strain: Not on file  Food Insecurity: Not on file  Transportation Needs: Not on file  Physical Activity: Not on file  Stress: Not on file  Social Connections: Not on file  Intimate Partner Violence: Not on file       Review of Systems  Constitutional:  Negative for chills, fatigue and unexpected weight change.  HENT:  Negative for congestion, postnasal drip, rhinorrhea, sneezing and sore throat.   Eyes:  Negative for redness.  Respiratory:  Negative for cough, chest tightness and shortness of breath.   Cardiovascular:  Negative for chest pain and palpitations.  Gastrointestinal:  Negative for abdominal pain, constipation, diarrhea, nausea and vomiting.  Genitourinary:  Negative for dysuria and frequency.  Musculoskeletal:  Negative for arthralgias, back pain, joint swelling and neck pain.  Skin:  Negative for rash.  Neurological: Negative.  Negative for tremors and numbness.  Hematological:  Negative for adenopathy. Does not bruise/bleed easily.  Psychiatric/Behavioral:  Negative for behavioral problems (Depression), sleep disturbance and suicidal ideas. The patient is not nervous/anxious.     Vital Signs: BP 128/80   Pulse 77   Temp 97.8 F (36.6 C)   Resp 16   Ht 5\' 5"  (1.651 m)   Wt 169 lb 3.2 oz (76.7 kg)   SpO2 98%   BMI 28.16 kg/m    Physical Exam Vitals and nursing note reviewed.  Constitutional:      General: He is not in acute distress.    Appearance: He is well-developed. He is not diaphoretic.  HENT:     Head: Normocephalic and atraumatic.     Mouth/Throat:     Pharynx: No oropharyngeal exudate.  Eyes:     Pupils: Pupils are equal, round, and reactive to light.  Neck:     Thyroid: No thyromegaly.     Vascular: No JVD.     Trachea: No tracheal deviation.  Cardiovascular:     Rate and Rhythm: Normal rate and regular rhythm.     Heart sounds: Normal heart sounds. No murmur heard.    No friction rub. No gallop.  Pulmonary:     Effort: Pulmonary effort is normal. No respiratory distress.     Breath sounds: No wheezing or rales.  Chest:     Chest wall: No tenderness.  Abdominal:     General: Bowel sounds are normal.     Palpations: Abdomen is soft.   Musculoskeletal:        General: Normal range of motion.     Cervical back: Normal range of motion and neck supple.  Lymphadenopathy:     Cervical: No cervical adenopathy.  Skin:    General: Skin is warm and dry.  Neurological:     Mental Status: He is alert and oriented to person, place, and time.     Cranial Nerves: No cranial nerve deficit.  Psychiatric:        Behavior: Behavior normal.        Thought Content: Thought content normal.        Judgment: Judgment normal.  Assessment/Plan: 1. Type 2 diabetes mellitus with hyperglycemia, without long-term current use of insulin (HCC) - POCT glycosylated hemoglobin (Hb A1C) is 8.1 which is up from 7.4 last visit. Pt continues to decline any med changes. Will be going to [place] this weekend and A1c dropped significantly the last trip he made. Will continue to monitor. Pt understands need for better control  2. Mixed hyperlipidemia - Lipid Panel With LDL/HDL Ratio  3. Acquired hypothyroidism - TSH + free T4  4. B12 deficiency - B12 and Folate Panel - cyanocobalamin (VITAMIN B12) injection 1,000 mcg  5. Other fatigue - CBC w/Diff/Platelet - Comprehensive metabolic panel - TSH + free T4 - B12 and Folate Panel - Lipid Panel With LDL/HDL Ratio   General Counseling: Jaimeson verbalizes understanding of the findings of todays visit and agrees with plan of treatment. I have discussed any further diagnostic evaluation that may be needed or ordered today. We also reviewed his medications today. he has been encouraged to call the office with any questions or concerns that should arise related to todays visit.    Orders Placed This Encounter  Procedures   CBC w/Diff/Platelet   Comprehensive metabolic panel   TSH + free T4   B12 and Folate Panel   Lipid Panel With LDL/HDL Ratio   POCT glycosylated hemoglobin (Hb A1C)    Meds ordered this encounter  Medications   cyanocobalamin (VITAMIN B12) injection 1,000 mcg     This patient was seen by Lynn Ito, PA-C in collaboration with Dr. Beverely Risen as a part of collaborative care agreement.   Total time spent:30 Minutes Time spent includes review of chart, medications, test results, and follow up plan with the patient.      Dr Lyndon Code Internal medicine

## 2023-04-23 NOTE — Patient Instructions (Signed)
Medication Instructions:  Your physician recommends the following medication changes.  STOP TAKING: Zetia  *If you need a refill on your cardiac medications before your next appointment, please call your pharmacy*   Lab Work: None ordered   Follow-Up: At [facility], you and your health needs are our priority.  As part of our continuing mission to provide you with exceptional heart care, we have created designated Provider Care Teams.  These Care Teams include your primary Cardiologist (physician) and Advanced Practice Providers (APPs -  Physician Assistants and Nurse Practitioners) who all work together to provide you with the care you need, when you need it.  We recommend signing up for the patient portal called "MyChart".  Sign up information is provided on this After Visit Summary.  MyChart is used to connect with patients for Virtual Visits (Telemedicine).  Patients are able to view lab/test results, encounter notes, upcoming appointments, etc.  Non-urgent messages can be sent to your provider as well.   To learn more about what you can do with MyChart, go to [email or web address].    Your next appointment:   12 month(s)  Provider:   You may see Lorine Bears, MD or one of the following Advanced Practice Providers on your designated Care Team:   Cadence [place], [place]

## 2023-04-23 NOTE — Progress Notes (Signed)
Cardiology Office Note:    Date:  04/23/2023   ID:  Travis Higgins, Travis Higgins Apr 20, 1951, MRN [id number]  PCP:  Travis Jews, PA-C  [facility] Cardiologist:  Lorine Bears, MD  [facility] Electrophysiologist:  None   Referring MD: Travis Jews, PA*   Chief Complaint: 1 year follow-up  History of Present Illness:    Travis Higgins is a 72 y.o. male with a hx of CAD, DM2, HTN, HLD, hypothyroidism, prior smoker who presents for follow-up.   He had a remote cath in 2015 that showed mild nonobstructive disease affecting the LAD and left circumflex. No revascularization was required. He was evaluated in 2018 for exertional dyspnea. He underwent a treadmill nuclear stress test which was intermediate risk study with evidence of prior anterior/anteroseptal infarct as well as apical inferior defect. The distribution was consistent with distal LAD as well as OM branch territory. EF was 49%. Echo showed LVEF 60-65%, no significant valvular abnormalities. Cardiac cath was recommended, but declined. He had myalgias with statin and was prescribed Zetia.   He was last seen 05/2022 and was overall doing well from a cardiac perspective.   Today, the patient is overall doing well. He denies chest pain, SOB, lower leg edema, lightheadedness, dizziness, heart racing, orthopnea or pnd. BP is a little high but this morning it was normal. He stopped Zetia 6 months to a year ago. He doesn't walk or do any formal activity. He is a Lawyer and walks at school. Diet is good.   Past Medical History:  Diagnosis Date   Anemia    in past   CAD (coronary artery disease)    Cardiac catheterization done in July 2015 in [place] by Dr. Windy Fast fields showed mild nonobstructive disease in the mid LAD and distal left circumflex with normal ejection fraction.   Diabetes mellitus without complication (HCC)    Hypertension    Hypothyroidism    Thyroid disease     Past Surgical History:   Procedure Laterality Date   COLONOSCOPY     COLONOSCOPY WITH PROPOFOL N/A 09/20/2015   Procedure: COLONOSCOPY WITH PROPOFOL;  Surgeon: Midge Minium, MD;  Location: [facility];  Service: Endoscopy;  Laterality: N/A;   COLONOSCOPY WITH PROPOFOL N/A 07/05/2020   Procedure: COLONOSCOPY WITH PROPOFOL;  Surgeon: Midge Minium, MD;  Location: [facility];  Service: Endoscopy;  Laterality: N/A;  Diabetic - oral meds   POLYPECTOMY  09/20/2015   Procedure: POLYPECTOMY INTESTINAL;  Surgeon: Midge Minium, MD;  Location: [facility];  Service: Endoscopy;;  Sigmoid colon polyp x 1 Rectal polyp x 2   POLYPECTOMY  07/05/2020   Procedure: POLYPECTOMY;  Surgeon: Midge Minium, MD;  Location: [facility];  Service: Endoscopy;;    Current Medications: Current Meds  Medication Sig   aspirin EC 81 MG tablet Take 81 mg by mouth daily. Swallow whole.   atorvastatin (LIPITOR) 10 MG tablet Take 1 tablet (10 mg total) by mouth daily.   glipiZIDE (GLUCOTROL) 5 MG tablet Take 1 tablet by mouth twice daily   levothyroxine (SYNTHROID) 50 MCG tablet Take 1 tablet by mouth once daily   omeprazole (PRILOSEC) 40 MG capsule TAKE ONE CAPSULE BY MOUTH IN THE MORNING AND AT BEDTIME   ramipril (ALTACE) 2.5 MG capsule Take 1 capsule by mouth once daily     Allergies:   Levothyroxine   Social History   Socioeconomic History   Marital status: Married    Spouse name: Not on file  Number of children: Not on file   Years of education: Not on file   Highest education level: Not on file  Occupational History   Not on file  Tobacco Use   Smoking status: Former   Smokeless tobacco: Never   Tobacco comments:    quit approx 2013  Vaping Use   Vaping status: Never Used  Substance and Sexual Activity   Alcohol use: No   Drug use: No   Sexual activity: Not on file  Other Topics Concern   Not on file  Social History Narrative   Not on file   Social Determinants of Health   Financial  Resource Strain: Not on file  Food Insecurity: Not on file  Transportation Needs: Not on file  Physical Activity: Not on file  Stress: Not on file  Social Connections: Not on file     Family History: The patient's Family history is unknown by patient.  ROS:   Please see the history of present illness.     All other systems reviewed and are negative.  EKGs/Labs/Other Studies Reviewed:    The following studies were reviewed today:  Echo 2018 Study Conclusions   - Left ventricle: The cavity size was normal. There was moderate    concentric hypertrophy. Systolic function was normal. The    estimated ejection fraction was in the range of 60% to 65%. Wall    motion was normal; there were no regional wall motion    abnormalities. Doppler parameters are consistent with abnormal    left ventricular relaxation (grade 1 diastolic dysfunction).  - Aortic root: The aortic root was mildly dilated 3.7 cm  - Ascending aorta: The ascending aorta was mildly dilated, 3.7 cm  - Left atrium: The atrium was normal in size.  - Right ventricle: Systolic function was normal.   Impressions:   - Sinus bradycardia rate 52 bpm.    EKG:  EKG is  ordered today.  The ekg ordered today demonstrates SB 53bpm, RBBB, LAFB, nonspecific ST changes  Recent Labs: 08/26/2022: ALT 28; BUN 12; Creatinine, Ser 0.84; Hemoglobin 14.5; Platelets 164; Potassium 4.1; Sodium 135; TSH 4.563  Recent Lipid Panel    Component Value Date/Time   CHOL 102 01/11/2023 0857   TRIG 70 01/11/2023 0857   HDL 34 (L) 01/11/2023 0857   CHOLHDL 3.0 01/11/2023 0857   VLDL 14 01/11/2023 0857   LDLCALC 54 01/11/2023 0857   Physical Exam:    VS:  BP (!) 142/68 (BP Location: Left Arm, Patient Position: Sitting, Cuff Size: Normal)   Pulse (!) 53   Ht 5\' 5"  (1.651 m)   Wt 169 lb (76.7 kg)   SpO2 96%   BMI 28.12 kg/m     Wt Readings from Last 3 Encounters:  04/23/23 169 lb (76.7 kg)  04/23/23 169 lb 3.2 oz (76.7 kg)  01/22/23  169 lb 6.4 oz (76.8 kg)     GEN:  Well nourished, well developed in no acute distress HEENT: Normal NECK: No JVD; No carotid bruits LYMPHATICS: No lymphadenopathy CARDIAC: RRR, no murmurs, rubs, gallops RESPIRATORY:  Clear to auscultation without rales, wheezing or rhonchi  ABDOMEN: Soft, non-tender, non-distended MUSCULOSKELETAL:  No edema; No deformity  SKIN: Warm and dry NEUROLOGIC:  Alert and oriented x 3 PSYCHIATRIC:  Normal affect   ASSESSMENT:    1. Coronary artery disease involving native coronary artery of native heart without angina pectoris   2. Essential hypertension   3. Hyperlipidemia, mixed   4. Type 2  diabetes mellitus with complication, without long-term current use of insulin (HCC)    PLAN:    In order of problems listed above:  CAD Patient denies anginal symptoms.  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  No further ischemic workup indicated at this time.  Continue aspirin 81 mg daily and Lipitor 10 mg daily.  He is not taking Zetia.  HTN BP mildly elevated, but this AM it was normal. Continue Ramipril 2.5mg  daily.  HLD He stopped Zetia 6-12 months ago. LDL 54 in August 2024. Continue Lipitor 10mg  daily.   DM2 A1C 7.4. This is followed by PCP.  Disposition: Follow up in 1 year(s) with MD/APP   Signed, Zayah Keilman David Stall, PA-C  04/23/2023 2:05 PM    [facility]

## 2023-06-02 ENCOUNTER — Other Ambulatory Visit: Payer: Self-pay | Admitting: Physician Assistant

## 2023-06-02 DIAGNOSIS — K219 Gastro-esophageal reflux disease without esophagitis: Secondary | ICD-10-CM

## 2023-06-04 ENCOUNTER — Ambulatory Visit (INDEPENDENT_AMBULATORY_CARE_PROVIDER_SITE_OTHER): Payer: Medicare Other | Admitting: Vascular Surgery

## 2023-06-04 ENCOUNTER — Encounter (INDEPENDENT_AMBULATORY_CARE_PROVIDER_SITE_OTHER): Payer: Medicare Other

## 2023-06-14 ENCOUNTER — Ambulatory Visit: Payer: Medicare Other | Admitting: Physician Assistant

## 2023-06-17 ENCOUNTER — Other Ambulatory Visit: Payer: Self-pay | Admitting: Physician Assistant

## 2023-06-17 DIAGNOSIS — I10 Essential (primary) hypertension: Secondary | ICD-10-CM

## 2023-06-17 DIAGNOSIS — E1165 Type 2 diabetes mellitus with hyperglycemia: Secondary | ICD-10-CM

## 2023-07-02 ENCOUNTER — Ambulatory Visit: Payer: Medicare Other | Admitting: Physician Assistant

## 2023-09-01 ENCOUNTER — Other Ambulatory Visit
Admission: RE | Admit: 2023-09-01 | Discharge: 2023-09-01 | Disposition: A | Discharge status: Home / Self Care | Attending: Physician Assistant | Admitting: Physician Assistant

## 2023-09-01 DIAGNOSIS — E538 Deficiency of other specified B group vitamins: Secondary | ICD-10-CM | POA: Insufficient documentation

## 2023-09-01 DIAGNOSIS — E782 Mixed hyperlipidemia: Secondary | ICD-10-CM | POA: Diagnosis not present

## 2023-09-01 DIAGNOSIS — R5383 Other fatigue: Secondary | ICD-10-CM | POA: Diagnosis not present

## 2023-09-01 DIAGNOSIS — E039 Hypothyroidism, unspecified: Secondary | ICD-10-CM | POA: Insufficient documentation

## 2023-09-01 LAB — CBC WITH DIFFERENTIAL/PLATELET
Abs Immature Granulocytes: 0.04 10*3/uL (ref 0.00–0.07)
Basophils Absolute: 0.1 10*3/uL (ref 0.0–0.1)
Basophils Relative: 1 %
Eosinophils Absolute: 0.2 10*3/uL (ref 0.0–0.5)
Eosinophils Relative: 4 %
HCT: 46.8 % (ref 39.0–52.0)
Hemoglobin: 15.3 g/dL (ref 13.0–17.0)
Immature Granulocytes: 1 %
Lymphocytes Relative: 30 %
Lymphs Abs: 1.7 10*3/uL (ref 0.7–4.0)
MCH: 28 pg (ref 26.0–34.0)
MCHC: 32.7 g/dL (ref 30.0–36.0)
MCV: 85.6 fL (ref 80.0–100.0)
Monocytes Absolute: 0.6 10*3/uL (ref 0.1–1.0)
Monocytes Relative: 10 %
Neutro Abs: 3.1 10*3/uL (ref 1.7–7.7)
Neutrophils Relative %: 54 %
Platelets: 181 10*3/uL (ref 150–400)
RBC: 5.47 MIL/uL (ref 4.22–5.81)
RDW: 13.3 % (ref 11.5–15.5)
WBC: 5.8 10*3/uL (ref 4.0–10.5)
nRBC: 0 % (ref 0.0–0.2)

## 2023-09-01 LAB — COMPREHENSIVE METABOLIC PANEL
ALT: 29 U/L (ref 0–44)
AST: 29 U/L (ref 15–41)
Albumin: 4 g/dL (ref 3.5–5.0)
Alkaline Phosphatase: 91 U/L (ref 38–126)
Anion gap: 4 — ABNORMAL LOW (ref 5–15)
BUN: 9 mg/dL (ref 8–23)
CO2: 27 mmol/L (ref 22–32)
Calcium: 9.7 mg/dL (ref 8.9–10.3)
Chloride: 107 mmol/L (ref 98–111)
Creatinine, Ser: 0.83 mg/dL (ref 0.61–1.24)
GFR, Estimated: >60 mL/min (ref >60)
Glucose, Bld: 110 mg/dL — ABNORMAL HIGH (ref 70–99)
Potassium: 4.9 mmol/L (ref 3.5–5.1)
Sodium: 138 mmol/L (ref 135–145)
Total Bilirubin: 1 mg/dL (ref 0.0–1.2)
Total Protein: 7.2 g/dL (ref 6.5–8.1)

## 2023-09-01 LAB — LIPID PANEL
Cholesterol: 100 mg/dL (ref 0–200)
HDL: 36 mg/dL — ABNORMAL LOW (ref >40)
LDL Cholesterol: 49 mg/dL (ref 0–99)
Total CHOL/HDL Ratio: 2.8 ratio
Triglycerides: 74 mg/dL (ref <150)
VLDL: 15 mg/dL (ref 0–40)

## 2023-09-01 LAB — VITAMIN B12: Vitamin B-12: 165 pg/mL — ABNORMAL LOW (ref 180–914)

## 2023-09-01 LAB — FOLATE: Folate: 35 ng/mL (ref >5.9)

## 2023-09-01 LAB — T4, FREE: Free T4: 0.95 ng/dL (ref 0.61–1.12)

## 2023-09-01 LAB — TSH: TSH: 5.423 u[IU]/mL — ABNORMAL HIGH (ref 0.350–4.500)

## 2023-09-07 ENCOUNTER — Ambulatory Visit (INDEPENDENT_AMBULATORY_CARE_PROVIDER_SITE_OTHER): Payer: Medicare Other | Admitting: Physician Assistant

## 2023-09-07 ENCOUNTER — Encounter: Payer: Self-pay | Admitting: Physician Assistant

## 2023-09-07 VITALS — BP 132/88 | HR 65 | Temp 97.8°F | Resp 16 | Ht 65.0 in | Wt 165.0 lb

## 2023-09-07 DIAGNOSIS — E538 Deficiency of other specified B group vitamins: Secondary | ICD-10-CM

## 2023-09-07 DIAGNOSIS — E1165 Type 2 diabetes mellitus with hyperglycemia: Secondary | ICD-10-CM | POA: Diagnosis not present

## 2023-09-07 DIAGNOSIS — Z0000 Encounter for general adult medical examination without abnormal findings: Secondary | ICD-10-CM | POA: Diagnosis not present

## 2023-09-07 DIAGNOSIS — I10 Essential (primary) hypertension: Secondary | ICD-10-CM

## 2023-09-07 DIAGNOSIS — E039 Hypothyroidism, unspecified: Secondary | ICD-10-CM | POA: Diagnosis not present

## 2023-09-07 LAB — POCT GLYCOSYLATED HEMOGLOBIN (HGB A1C): Hemoglobin A1C: 7.9 % — AB (ref 4.0–5.6)

## 2023-09-07 MED ORDER — CYANOCOBALAMIN 1000 MCG/ML IJ SOLN
1000.0000 ug | Freq: Once | INTRAMUSCULAR | Status: AC
  Administered 2023-09-07: 1000 ug via INTRAMUSCULAR

## 2023-09-07 NOTE — Progress Notes (Signed)
 [facility] [address] [place], [place] [address]  Internal MEDICINE  Office Visit Note  Patient Name: Travis Higgins  [id number]  [id number]  Date of Service: 09/19/2023  Chief Complaint  Patient presents with   Medicare Wellness   Diabetes   Hypertension    HPI [name] presents for an annual well visit Well-appearing 73 y.o. male Routine CRC screening: UTD, done in 2022 Eye exam and/or foot exam: UTD, Had eye exam in Dec. and Foot exam in Aug Labs: Reviewed--TSH elevated (will take 1.5 tabs on "Sundays), B12 low--injections,  Other concerns: got temporary dental implants so diet has not been good due to this. Discussed need for better control of diabetes and states this should improve with warm weather and diet/exercise changes     09/07/2023    9:56 AM 06/09/2022    9:00 AM 04/11/2021    2:18 PM  MMSE - Mini Mental State Exam  Orientation to time 5 5 5  Orientation to Place 5 5 5  Registration 3 3 3  Attention/ Calculation 5 5 5  Recall 3 3 3  Language- name 2 objects 2 2 2  Language- repeat 1 1 1  Language- follow 3 step command 3 3 3  Language- read & follow direction 1 1 1  Write a sentence 1 1 1  Copy design 1 0 1  Total score 30 29 30    Functional Status Survey: Is the patient deaf or have difficulty hearing?: No Does the patient have difficulty seeing, even when wearing glasses/contacts?: No Does the patient have difficulty concentrating, remembering, or making decisions?: No Does the patient have difficulty walking or climbing stairs?: No Does the patient have difficulty dressing or bathing?: No Does the patient have difficulty doing errands alone such as visiting a doctor's office or shopping?: No     12" /29/2023    8:59 AM 09/11/2022    8:43 AM 01/22/2023    9:23 AM 04/23/2023   10:14 AM 09/07/2023    9:55 AM  Fall Risk  Falls in the past year? 0 0 0 0 0  Was there an injury with Fall? 0      Fall Risk Category Calculator 0       Fall Risk Category (Retired) Low      (RETIRED) Patient Fall Risk Level Low fall risk      Patient at Risk for Falls Due to No Fall Risks   No Fall Risks   Fall risk Follow up Falls evaluation completed   Falls evaluation completed        09/07/2023    9:55 AM  Depression screen PHQ 2/9  Decreased Interest 0  Down, Depressed, Hopeless 0  PHQ - 2 Score 0        No data to display            Current Medication: Outpatient Encounter Medications as of 09/07/2023  Medication Sig   aspirin EC 81 MG tablet Take 81 mg by mouth daily. Swallow whole.   ezetimibe (ZETIA) 10 MG tablet Take 10 mg by mouth daily.   glipiZIDE (GLUCOTROL) 5 MG tablet Take 1 tablet by mouth twice daily   levothyroxine (SYNTHROID) 50 MCG tablet Take 1 tablet by mouth once daily   omeprazole (PRILOSEC) 40 MG capsule TAKE ONE CAPSULE BY MOUTH EVERY MORNING AND TAKE ONE CAPSULE BY MOUTH EVERY NIGHT AT BEDTIME   ramipril (ALTACE) 2.5 MG capsule Take 1 capsule by mouth once daily   Zoster  Vaccine Adjuvanted [facility]) injection Inject 0.5 mLs into the muscle once.   atorvastatin (LIPITOR) 10 MG tablet Take 1 tablet (10 mg total) by mouth daily.   [EXPIRED] cyanocobalamin (VITAMIN B12) injection 1,000 mcg    No facility-administered encounter medications on file as of 09/07/2023.    Surgical History: Past Surgical History:  Procedure Laterality Date   COLONOSCOPY     COLONOSCOPY WITH PROPOFOL N/A 09/20/2015   Procedure: COLONOSCOPY WITH PROPOFOL;  Surgeon: Midge Minium, MD;  Location: [facility];  Service: Endoscopy;  Laterality: N/A;   COLONOSCOPY WITH PROPOFOL N/A 07/05/2020   Procedure: COLONOSCOPY WITH PROPOFOL;  Surgeon: Midge Minium, MD;  Location: [facility];  Service: Endoscopy;  Laterality: N/A;  Diabetic - oral meds   POLYPECTOMY  09/20/2015   Procedure: POLYPECTOMY INTESTINAL;  Surgeon: Midge Minium, MD;  Location: [facility];  Service: Endoscopy;;  Sigmoid colon polyp x  1 Rectal polyp x 2   POLYPECTOMY  07/05/2020   Procedure: POLYPECTOMY;  Surgeon: Midge Minium, MD;  Location: [facility];  Service: Endoscopy;;    Medical History: Past Medical History:  Diagnosis Date   Anemia    in past   CAD (coronary artery disease)    Cardiac catheterization done in July 2015 in  by Dr. Windy Fast fields showed mild nonobstructive disease in the mid LAD and distal left circumflex with normal ejection fraction.   Diabetes mellitus without complication (HCC)    Hypertension    Hypothyroidism    Thyroid disease     Family History: Family History  Family history unknown: Yes    Social History   Socioeconomic History   Marital status: Married    Spouse name: Not on file   Number of children: Not on file   Years of education: Not on file   Highest education level: Not on file  Occupational History   Not on file  Tobacco Use   Smoking status: Former   Smokeless tobacco: Never   Tobacco comments:    quit approx 2013  Vaping Use   Vaping status: Never Used  Substance and Sexual Activity   Alcohol use: No   Drug use: No   Sexual activity: Not on file  Other Topics Concern   Not on file  Social History Narrative   Not on file   Social Drivers of Health   Financial Resource Strain: Not on file  Food Insecurity: Not on file  Transportation Needs: Not on file  Physical Activity: Not on file  Stress: Not on file  Social Connections: Not on file  Intimate Partner Violence: Not on file      Review of Systems  Constitutional:  Negative for chills, fatigue and unexpected weight change.  HENT:  Negative for congestion, postnasal drip, rhinorrhea, sneezing and sore throat.   Eyes:  Negative for redness.  Respiratory:  Negative for cough, chest tightness and shortness of breath.   Cardiovascular:  Negative for chest pain and palpitations.  Gastrointestinal:  Negative for abdominal pain, constipation, diarrhea, nausea and vomiting.   Genitourinary:  Negative for dysuria and frequency.  Musculoskeletal:  Negative for arthralgias, back pain, joint swelling and neck pain.  Skin:  Negative for rash.  Neurological: Negative.  Negative for tremors and numbness.  Hematological:  Negative for adenopathy. Does not bruise/bleed easily.  Psychiatric/Behavioral:  Negative for behavioral problems (Depression), sleep disturbance and suicidal ideas. The patient is not nervous/anxious.     Vital Signs: BP 132/88   Pulse 65  Temp 97.8 F (36.6 C)   Resp 16   Ht 5\' 5"  (1.651 m)   Wt 165 lb (74.8 kg)   SpO2 97%   BMI 27.46 kg/m    Physical Exam Vitals and nursing note reviewed.  Constitutional:      General: He is not in acute distress.    Appearance: Normal appearance. He is well-developed.  HENT:     Head: Normocephalic and atraumatic.  Eyes:     Pupils: Pupils are equal, round, and reactive to light.  Neck:     Thyroid: No thyromegaly.     Vascular: No JVD.     Trachea: No tracheal deviation.  Cardiovascular:     Rate and Rhythm: Normal rate and regular rhythm.     Heart sounds: Normal heart sounds.  Pulmonary:     Effort: Pulmonary effort is normal. No respiratory distress.     Breath sounds: No wheezing or rales.  Musculoskeletal:     Cervical back: Normal range of motion and neck supple.  Lymphadenopathy:     Cervical: No cervical adenopathy.  Skin:    General: Skin is warm and dry.  Neurological:     Mental Status: He is alert and oriented to person, place, and time.  Psychiatric:        Behavior: Behavior normal.        Thought Content: Thought content normal.        Judgment: Judgment normal.        Assessment/Plan: 1. Encounter for Medicare annual wellness exam (Primary) AWV performed, labs reviewed, UTD on PHM  2. Type 2 diabetes mellitus with hyperglycemia, without long-term current use of insulin (HCC) - POCT HgB A1C is 7.9 which is improved from 8.1 last visit. Discussed need for  better control, but pt continues to decline any med changes and states it should improve further next visit with improvement in diet and exercise. - Urine Microalbumin w/creat. ratio  3. Essential hypertension Stable, continue current medications  4. Acquired hypothyroidism TSH elevated, will increase to 1.5 tabs on Sundays and 1 tab on remaining days. Will plan to recheck labs  5. B12 deficiency - cyanocobalamin (VITAMIN B12) injection 1,000 mcg     General Counseling: Quayshaun verbalizes understanding of the findings of todays visit and agrees with plan of treatment. I have discussed any further diagnostic evaluation that may be needed or ordered today. We also reviewed his medications today. he has been encouraged to call the office with any questions or concerns that should arise related to todays visit.    Orders Placed This Encounter  Procedures   Urine Microalbumin w/creat. ratio   POCT HgB A1C    Meds ordered this encounter  Medications   cyanocobalamin (VITAMIN B12) injection 1,000 mcg    Return in about 4 months (around 01/07/2024) for A1c, foot exam; B12 shots.   Total time spent:35 Minutes Time spent includes review of chart, medications, test results, and follow up plan with the patient.   [place] Controlled Substance Database was reviewed by me.  This patient was seen by Lynn Ito, PA-C in collaboration with Dr. Beverely Risen as a part of collaborative care agreement.  Lynn Ito, PA-C Internal medicine

## 2023-09-08 LAB — MICROALBUMIN / CREATININE URINE RATIO
Creatinine, Urine: 85.8 mg/dL
Microalb/Creat Ratio: 12 mg/g{creat} (ref 0–29)
Microalbumin, Urine: 10.1 ug/mL

## 2023-09-17 ENCOUNTER — Ambulatory Visit (INDEPENDENT_AMBULATORY_CARE_PROVIDER_SITE_OTHER)

## 2023-09-17 DIAGNOSIS — E538 Deficiency of other specified B group vitamins: Secondary | ICD-10-CM | POA: Diagnosis not present

## 2023-09-17 MED ORDER — CYANOCOBALAMIN 1000 MCG/ML IJ SOLN
1000.0000 ug | Freq: Once | INTRAMUSCULAR | Status: AC
  Administered 2023-09-17: 1000 ug via INTRAMUSCULAR

## 2023-09-24 ENCOUNTER — Ambulatory Visit

## 2023-10-01 ENCOUNTER — Ambulatory Visit (INDEPENDENT_AMBULATORY_CARE_PROVIDER_SITE_OTHER)

## 2023-10-01 DIAGNOSIS — E538 Deficiency of other specified B group vitamins: Secondary | ICD-10-CM

## 2023-10-01 MED ORDER — CYANOCOBALAMIN 1000 MCG/ML IJ SOLN
1000.0000 ug | Freq: Once | INTRAMUSCULAR | Status: AC
Start: 2023-10-01 — End: 2023-10-01
  Administered 2023-10-01: 1000 ug via INTRAMUSCULAR

## 2023-10-22 ENCOUNTER — Ambulatory Visit (INDEPENDENT_AMBULATORY_CARE_PROVIDER_SITE_OTHER)

## 2023-10-22 DIAGNOSIS — E538 Deficiency of other specified B group vitamins: Secondary | ICD-10-CM

## 2023-10-22 MED ORDER — CYANOCOBALAMIN 1000 MCG/ML IJ SOLN
1000.0000 ug | Freq: Once | INTRAMUSCULAR | Status: AC
  Administered 2023-10-22: 1000 ug via INTRAMUSCULAR

## 2023-10-30 ENCOUNTER — Encounter (INDEPENDENT_AMBULATORY_CARE_PROVIDER_SITE_OTHER): Payer: Self-pay

## 2023-11-19 ENCOUNTER — Ambulatory Visit (INDEPENDENT_AMBULATORY_CARE_PROVIDER_SITE_OTHER)

## 2023-11-19 DIAGNOSIS — E538 Deficiency of other specified B group vitamins: Secondary | ICD-10-CM | POA: Diagnosis not present

## 2023-11-19 MED ORDER — CYANOCOBALAMIN 1000 MCG/ML IJ SOLN
1000.0000 ug | Freq: Once | INTRAMUSCULAR | Status: AC
  Administered 2023-11-19: 1000 ug via INTRAMUSCULAR

## 2023-12-24 ENCOUNTER — Ambulatory Visit

## 2023-12-24 DIAGNOSIS — E538 Deficiency of other specified B group vitamins: Secondary | ICD-10-CM

## 2023-12-24 MED ORDER — CYANOCOBALAMIN 1000 MCG/ML IJ SOLN
1000.0000 ug | Freq: Once | INTRAMUSCULAR | Status: AC
  Administered 2023-12-24: 1000 ug via INTRAMUSCULAR

## 2024-01-17 ENCOUNTER — Ambulatory Visit (INDEPENDENT_AMBULATORY_CARE_PROVIDER_SITE_OTHER): Admitting: Physician Assistant

## 2024-01-17 ENCOUNTER — Encounter: Payer: Self-pay | Admitting: Physician Assistant

## 2024-01-17 VITALS — BP 115/80 | HR 85 | Temp 98.2°F | Resp 16 | Ht 65.0 in | Wt 166.4 lb

## 2024-01-17 DIAGNOSIS — E538 Deficiency of other specified B group vitamins: Secondary | ICD-10-CM

## 2024-01-17 DIAGNOSIS — I10 Essential (primary) hypertension: Secondary | ICD-10-CM

## 2024-01-17 DIAGNOSIS — E1165 Type 2 diabetes mellitus with hyperglycemia: Secondary | ICD-10-CM | POA: Diagnosis not present

## 2024-01-17 DIAGNOSIS — E039 Hypothyroidism, unspecified: Secondary | ICD-10-CM

## 2024-01-17 LAB — POCT GLYCOSYLATED HEMOGLOBIN (HGB A1C): Hemoglobin A1C: 7.5 % — AB (ref 4.0–5.6)

## 2024-01-17 MED ORDER — CYANOCOBALAMIN 1000 MCG/ML IJ SOLN
1000.0000 ug | Freq: Once | INTRAMUSCULAR | Status: AC
  Administered 2024-01-17: 1000 ug via INTRAMUSCULAR

## 2024-01-17 NOTE — Progress Notes (Signed)
 [facility] [address] [place], [place] [address]  Internal MEDICINE  Office Visit Note  Patient Name: Travis Higgins  [id number]  [id number]  Date of Service: 01/17/2024  Chief Complaint  Patient presents with   Follow-up   Diabetes   Hypertension   Quality Metric Gaps    Eye exam    HPI Pt is here for routine follow up -walking more, and sugars improving -BP stable -synthroid , has been taking only 1 tab synthroid  instead of the 1.5 on Sundays and will start this now before rechecking labs. -Also has done several B12 injections and can plan to recheck labs in a few months after stopping injections -foot exam done and form for diabetic shoes completed -bump consistent with likely cyst behind left ear which he states is not bothersome. Stable for over 5 years now. Declines dermatology referral at this time in favor of monitoring further given stability   Current Medication: Outpatient Encounter Medications as of 01/17/2024  Medication Sig   aspirin  EC 81 MG tablet Take 81 mg by mouth daily. Swallow whole.   atorvastatin  (LIPITOR) 10 MG tablet Take 1 tablet (10 mg total) by mouth daily.   ezetimibe  (ZETIA ) 10 MG tablet Take 10 mg by mouth daily.   glipiZIDE  (GLUCOTROL ) 5 MG tablet Take 1 tablet by mouth twice daily   levothyroxine  (SYNTHROID ) 50 MCG tablet Take 1 tablet by mouth once daily   omeprazole  (PRILOSEC) 40 MG capsule TAKE ONE CAPSULE BY MOUTH EVERY MORNING AND TAKE ONE CAPSULE BY MOUTH EVERY NIGHT AT BEDTIME   ramipril  (ALTACE ) 2.5 MG capsule Take 1 capsule by mouth once daily   Zoster Vaccine Adjuvanted (SHINGRIX) injection Inject 0.5 mLs into the muscle once.   [EXPIRED] cyanocobalamin  (VITAMIN B12) injection 1,000 mcg    No facility-administered encounter medications on file as of 01/17/2024.    Surgical History: Past Surgical History:  Procedure Laterality Date   COLONOSCOPY     COLONOSCOPY WITH PROPOFOL  N/A 09/20/2015   Procedure: COLONOSCOPY  WITH PROPOFOL ;  Surgeon: Rogelia Copping, MD;  Location: [facility];  Service: Endoscopy;  Laterality: N/A;   COLONOSCOPY WITH PROPOFOL  N/A 07/05/2020   Procedure: COLONOSCOPY WITH PROPOFOL ;  Surgeon: Copping Rogelia, MD;  Location: [facility];  Service: Endoscopy;  Laterality: N/A;  Diabetic - oral meds   POLYPECTOMY  09/20/2015   Procedure: POLYPECTOMY INTESTINAL;  Surgeon: Rogelia Copping, MD;  Location: [facility];  Service: Endoscopy;;  Sigmoid colon polyp x 1 Rectal polyp x 2   POLYPECTOMY  07/05/2020   Procedure: POLYPECTOMY;  Surgeon: Copping Rogelia, MD;  Location: [facility];  Service: Endoscopy;;    Medical History: Past Medical History:  Diagnosis Date   Anemia    in past   CAD (coronary artery disease)    Cardiac catheterization done in July 2015 in [place]  by Dr. Tanda fields showed mild nonobstructive disease in the mid LAD and distal left circumflex with normal ejection fraction.   Diabetes mellitus without complication (HCC)    Hypertension    Hypothyroidism    Thyroid  disease     Family History: Family History  Family history unknown: Yes    Social History   Socioeconomic History   Marital status: Married    Spouse name: Not on file   Number of children: Not on file   Years of education: Not on file   Highest education level: Not on file  Occupational History   Not on file  Tobacco Use  Smoking status: Former   Smokeless tobacco: Never   Tobacco comments:    quit approx 2013  Vaping Use   Vaping status: Never Used  Substance and Sexual Activity   Alcohol use: No   Drug use: No   Sexual activity: Not on file  Other Topics Concern   Not on file  Social History Narrative   Not on file   Social Drivers of Health   Financial Resource Strain: Not on file  Food Insecurity: Not on file  Transportation Needs: Not on file  Physical Activity: Not on file  Stress: Not on file  Social Connections: Not on file  Intimate  Partner Violence: Not on file      Review of Systems  Constitutional:  Negative for chills, fatigue and unexpected weight change.  HENT:  Negative for congestion, postnasal drip, rhinorrhea, sneezing and sore throat.   Eyes:  Negative for redness.  Respiratory:  Negative for cough, chest tightness and shortness of breath.   Cardiovascular:  Negative for chest pain and palpitations.  Gastrointestinal:  Negative for abdominal pain, constipation, diarrhea, nausea and vomiting.  Genitourinary:  Negative for dysuria and frequency.  Musculoskeletal:  Negative for arthralgias, back pain, joint swelling and neck pain.  Skin:  Negative for rash.  Neurological: Negative.  Negative for tremors and numbness.  Hematological:  Negative for adenopathy. Does not bruise/bleed easily.  Psychiatric/Behavioral:  Negative for behavioral problems (Depression), sleep disturbance and suicidal ideas. The patient is not nervous/anxious.     Vital Signs: BP 115/80   Pulse 85   Temp 98.2 F (36.8 C)   Resp 16   Ht 5' 5 (1.651 m)   Wt 166 lb 6.4 oz (75.5 kg)   SpO2 98%   BMI 27.69 kg/m    Physical Exam Vitals and nursing note reviewed.  Constitutional:      General: He is not in acute distress.    Appearance: He is well-developed. He is not diaphoretic.  HENT:     Head: Normocephalic and atraumatic.     Mouth/Throat:     Pharynx: No oropharyngeal exudate.  Eyes:     Pupils: Pupils are equal, round, and reactive to light.  Neck:     Thyroid : No thyromegaly.     Vascular: No JVD.     Trachea: No tracheal deviation.  Cardiovascular:     Rate and Rhythm: Normal rate and regular rhythm.     Heart sounds: Normal heart sounds. No murmur heard.    No friction rub. No gallop.  Pulmonary:     Effort: Pulmonary effort is normal. No respiratory distress.     Breath sounds: No wheezing or rales.  Chest:     Chest wall: No tenderness.  Musculoskeletal:        General: Normal range of motion.      Cervical back: Normal range of motion and neck supple.     Right foot: Bunion present.  Feet:     Right foot:     Protective Sensation: 2 sites tested.  2 sites sensed.     Skin integrity: Callus and dry skin present.     Toenail Condition: Right toenails are abnormally thick.     Left foot:     Protective Sensation: 2 sites tested.  2 sites sensed.     Skin integrity: Callus and dry skin present.     Toenail Condition: Left toenails are abnormally thick.  Lymphadenopathy:     Cervical: No cervical adenopathy.  Skin:  General: Skin is warm and dry.     Comments: ~2cm well circumscribed lump behind left ear, non tender, no erythema. Consistent with cyst  Neurological:     Mental Status: He is alert and oriented to person, place, and time.     Cranial Nerves: No cranial nerve deficit.  Psychiatric:        Behavior: Behavior normal.        Thought Content: Thought content normal.        Judgment: Judgment normal.        Assessment/Plan: 1. Type 2 diabetes mellitus with hyperglycemia, without long-term current use of insulin (HCC) (Primary) - POCT HgB A1C is 7.5 which is improved from 7.9 last visit and encouraged to continue to work on this. Diabetic shoe order form completed as patient does have some polyneuropathy with callus formation and bunion on right foot.  2. Acquired hypothyroidism Will recheck labs in 2 months after taking 1.5 tabs on Sundays and 1 tab remaining days - TSH + free T4  3. B12 deficiency Will recheck B12 in a few months after stopping injections - cyanocobalamin  (VITAMIN B12) injection 1,000 mcg - B12 and Folate Panel  4. Essential hypertension Stable, continue current medication   General Counseling: Hubbert verbalizes understanding of the findings of todays visit and agrees with plan of treatment. I have discussed any further diagnostic evaluation that may be needed or ordered today. We also reviewed his medications today. he has been  encouraged to call the office with any questions or concerns that should arise related to todays visit.    Orders Placed This Encounter  Procedures   B12 and Folate Panel   TSH + free T4   POCT HgB A1C    Meds ordered this encounter  Medications   cyanocobalamin  (VITAMIN B12) injection 1,000 mcg    This patient was seen by Tinnie Pro, PA-C in collaboration with Dr. Sigrid Bathe as a part of collaborative care agreement.   Total time spent:30 Minutes Time spent includes review of chart, medications, test results, and follow up plan with the patient.      Dr Fozia M Khan Internal medicine

## 2024-01-21 ENCOUNTER — Ambulatory Visit

## 2024-02-18 ENCOUNTER — Ambulatory Visit

## 2024-02-19 ENCOUNTER — Other Ambulatory Visit: Payer: Self-pay

## 2024-02-19 DIAGNOSIS — E1165 Type 2 diabetes mellitus with hyperglycemia: Secondary | ICD-10-CM

## 2024-02-19 MED ORDER — GLIPIZIDE 5 MG PO TABS
5.0000 mg | ORAL_TABLET | Freq: Two times a day (BID) | ORAL | 1 refills | Status: AC
Start: 2024-02-19 — End: ?

## 2024-04-12 DIAGNOSIS — Z23 Encounter for immunization: Secondary | ICD-10-CM | POA: Diagnosis not present

## 2024-04-18 ENCOUNTER — Other Ambulatory Visit
Admission: RE | Admit: 2024-04-18 | Discharge: 2024-04-18 | Disposition: A | Discharge status: Home / Self Care | Source: Ambulatory Visit | Attending: Physician Assistant | Admitting: Physician Assistant

## 2024-04-18 DIAGNOSIS — E039 Hypothyroidism, unspecified: Secondary | ICD-10-CM | POA: Diagnosis not present

## 2024-04-18 DIAGNOSIS — E538 Deficiency of other specified B group vitamins: Secondary | ICD-10-CM | POA: Insufficient documentation

## 2024-04-18 LAB — T4, FREE: Free T4: 0.87 ng/dL (ref 0.61–1.12)

## 2024-04-18 LAB — TSH: TSH: 5.012 u[IU]/mL — ABNORMAL HIGH (ref 0.350–4.500)

## 2024-04-18 LAB — VITAMIN B12: Vitamin B-12: 166 pg/mL — ABNORMAL LOW (ref 180–914)

## 2024-04-18 LAB — FOLATE: Folate: >20 ng/mL (ref >5.9)

## 2024-04-21 ENCOUNTER — Encounter: Payer: Self-pay | Admitting: Physician Assistant

## 2024-04-21 ENCOUNTER — Ambulatory Visit (INDEPENDENT_AMBULATORY_CARE_PROVIDER_SITE_OTHER): Admitting: Physician Assistant

## 2024-04-21 VITALS — BP 135/70 | HR 58 | Temp 98.3°F | Resp 16 | Ht 65.0 in | Wt 165.0 lb

## 2024-04-21 DIAGNOSIS — E1165 Type 2 diabetes mellitus with hyperglycemia: Secondary | ICD-10-CM | POA: Diagnosis not present

## 2024-04-21 DIAGNOSIS — E538 Deficiency of other specified B group vitamins: Secondary | ICD-10-CM

## 2024-04-21 DIAGNOSIS — E039 Hypothyroidism, unspecified: Secondary | ICD-10-CM

## 2024-04-21 LAB — POCT GLYCOSYLATED HEMOGLOBIN (HGB A1C): Hemoglobin A1C: 7.7 % — AB (ref 4.0–5.6)

## 2024-04-21 NOTE — Progress Notes (Unsigned)
 [facility] [address] [place], [place] [address]  Internal MEDICINE  Office Visit Note  Patient Name: Travis Higgins  [id number]  [id number]  Date of Service: 04/21/2024  Chief Complaint  Patient presents with   Follow-up   Diabetes   Hypertension    HPI Pt is here for routine follow up -labs reviewed: B12 low -TSH still high, pt never raised dose. Again discussed raising to 1.5 tabs Sundays and 1 tab remaining days -will be leaving for [place] soon, will have eye exam in [place] and will bring copy -A1c rising and discussed SGLT2 again. He is thinking about trying Doreen, but is looking at getting while abroad due to cost locally  Current Medication: Outpatient Encounter Medications as of 04/21/2024  Medication Sig   aspirin  EC 81 MG tablet Take 81 mg by mouth daily. Swallow whole.   atorvastatin  (LIPITOR) 10 MG tablet Take 1 tablet (10 mg total) by mouth daily.   ezetimibe  (ZETIA ) 10 MG tablet Take 10 mg by mouth daily.   glipiZIDE  (GLUCOTROL ) 5 MG tablet Take 1 tablet (5 mg total) by mouth 2 (two) times daily.   levothyroxine  (SYNTHROID ) 50 MCG tablet Take 1 tablet by mouth once daily   omeprazole  (PRILOSEC) 40 MG capsule TAKE ONE CAPSULE BY MOUTH EVERY MORNING AND TAKE ONE CAPSULE BY MOUTH EVERY NIGHT AT BEDTIME   ramipril  (ALTACE ) 2.5 MG capsule Take 1 capsule by mouth once daily   Zoster Vaccine Adjuvanted (SHINGRIX) injection Inject 0.5 mLs into the muscle once.   No facility-administered encounter medications on file as of 04/21/2024.    Surgical History: Past Surgical History:  Procedure Laterality Date   COLONOSCOPY     COLONOSCOPY WITH PROPOFOL  N/A 09/20/2015   Procedure: COLONOSCOPY WITH PROPOFOL ;  Surgeon: Rogelia Copping, MD;  Location: [facility];  Service: Endoscopy;  Laterality: N/A;   COLONOSCOPY WITH PROPOFOL  N/A 07/05/2020   Procedure: COLONOSCOPY WITH PROPOFOL ;  Surgeon: Copping Rogelia, MD;  Location: [facility];  Service:  Endoscopy;  Laterality: N/A;  Diabetic - oral meds   POLYPECTOMY  09/20/2015   Procedure: POLYPECTOMY INTESTINAL;  Surgeon: Rogelia Copping, MD;  Location: [facility];  Service: Endoscopy;;  Sigmoid colon polyp x 1 Rectal polyp x 2   POLYPECTOMY  07/05/2020   Procedure: POLYPECTOMY;  Surgeon: Copping Rogelia, MD;  Location: [facility];  Service: Endoscopy;;    Medical History: Past Medical History:  Diagnosis Date   Anemia    in past   CAD (coronary artery disease)    Cardiac catheterization done in July 2015 in [place]  by Dr. Tanda fields showed mild nonobstructive disease in the mid LAD and distal left circumflex with normal ejection fraction.   Diabetes mellitus without complication (HCC)    Hypertension    Hypothyroidism    Thyroid  disease     Family History: Family History  Family history unknown: Yes    Social History   Socioeconomic History   Marital status: Married    Spouse name: Not on file   Number of children: Not on file   Years of education: Not on file   Highest education level: Not on file  Occupational History   Not on file  Tobacco Use   Smoking status: Former   Smokeless tobacco: Never   Tobacco comments:    quit approx 2013  Vaping Use   Vaping status: Never Used  Substance and Sexual Activity   Alcohol use: No   Drug use: No  Sexual activity: Not on file  Other Topics Concern   Not on file  Social History Narrative   Not on file   Social Drivers of Health   Financial Resource Strain: Not on file  Food Insecurity: Not on file  Transportation Needs: Not on file  Physical Activity: Not on file  Stress: Not on file  Social Connections: Not on file  Intimate Partner Violence: Not on file      Review of Systems  Constitutional:  Negative for chills, fatigue and unexpected weight change.  HENT:  Negative for congestion, postnasal drip, rhinorrhea, sneezing and sore throat.   Eyes:  Negative for redness.  Respiratory:   Negative for cough, chest tightness and shortness of breath.   Cardiovascular:  Negative for chest pain and palpitations.  Gastrointestinal:  Negative for abdominal pain, constipation, diarrhea, nausea and vomiting.  Genitourinary:  Negative for dysuria and frequency.  Musculoskeletal:  Negative for arthralgias, back pain, joint swelling and neck pain.  Skin:  Negative for rash.  Neurological: Negative.  Negative for tremors and numbness.  Hematological:  Negative for adenopathy. Does not bruise/bleed easily.  Psychiatric/Behavioral:  Negative for behavioral problems (Depression), sleep disturbance and suicidal ideas. The patient is not nervous/anxious.     Vital Signs: BP 135/70   Pulse (!) 58   Temp 98.3 F (36.8 C)   Resp 16   Ht 5' 5 (1.651 m)   Wt 165 lb (74.8 kg)   SpO2 97%   BMI 27.46 kg/m    Physical Exam Vitals and nursing note reviewed.  Constitutional:      General: He is not in acute distress.    Appearance: Normal appearance. He is well-developed.  HENT:     Head: Normocephalic and atraumatic.  Eyes:     Pupils: Pupils are equal, round, and reactive to light.  Neck:     Thyroid : No thyromegaly.     Vascular: No JVD.     Trachea: No tracheal deviation.  Cardiovascular:     Rate and Rhythm: Normal rate and regular rhythm.     Heart sounds: Normal heart sounds.  Pulmonary:     Effort: Pulmonary effort is normal. No respiratory distress.     Breath sounds: No wheezing or rales.  Musculoskeletal:     Cervical back: Normal range of motion and neck supple.  Lymphadenopathy:     Cervical: No cervical adenopathy.  Skin:    General: Skin is warm and dry.  Neurological:     Mental Status: He is alert and oriented to person, place, and time.  Psychiatric:        Behavior: Behavior normal.        Thought Content: Thought content normal.        Judgment: Judgment normal.        Assessment/Plan: 1. Type 2 diabetes mellitus with hyperglycemia, without  long-term current use of insulin (HCC) (Primary) - POCT HgB A1C 7.7 which is up from 7.5 last visit. Again discussed need for better control. Would like to add SGLT2 and pt states he is open to starting Farxiga, however he plans to obtain while [place] due to cost. Pt will also have diabetic eye exam and bring copy to office  2. Acquired hypothyroidism Again discussed increasing synthroid  to 1.5 tabs 1 day per week due to elevated TSH and pt will move forward with this now  3. B12 deficiency B12 given in office and pt will supplement   General Counseling: Skyeler verbalizes understanding of  the findings of todays visit and agrees with plan of treatment. I have discussed any further diagnostic evaluation that may be needed or ordered today. We also reviewed his medications today. he has been encouraged to call the office with any questions or concerns that should arise related to todays visit.    Orders Placed This Encounter  Procedures   POCT HgB A1C    No orders of the defined types were placed in this encounter.   This patient was seen by Tinnie Pro, PA-C in collaboration with Dr. Sigrid Bathe as a part of collaborative care agreement.   Total time spent:30 Minutes Time spent includes review of chart, medications, test results, and follow up plan with the patient.      Dr Fozia M Khan Internal medicine

## 2024-04-24 ENCOUNTER — Ambulatory Visit: Admitting: Physician Assistant

## 2024-05-05 DIAGNOSIS — E039 Hypothyroidism, unspecified: Secondary | ICD-10-CM | POA: Diagnosis not present

## 2024-05-05 DIAGNOSIS — E538 Deficiency of other specified B group vitamins: Secondary | ICD-10-CM | POA: Diagnosis not present

## 2024-05-05 DIAGNOSIS — E1165 Type 2 diabetes mellitus with hyperglycemia: Secondary | ICD-10-CM | POA: Diagnosis not present

## 2024-05-05 MED ORDER — CYANOCOBALAMIN 1000 MCG/ML IJ SOLN
1000.0000 ug | Freq: Once | INTRAMUSCULAR | Status: AC
  Administered 2024-05-05: 1000 ug via INTRAMUSCULAR

## 2024-07-28 ENCOUNTER — Ambulatory Visit: Admitting: Physician Assistant

## 2024-09-08 ENCOUNTER — Ambulatory Visit: Admitting: Physician Assistant
